# Patient Record
Sex: Male | Born: 1960 | Race: White | Hispanic: No | Smoking: Never smoker
Health system: Southern US, Community
[De-identification: ages and names within clinical notes are randomized; demographics above are authoritative.]

## PROBLEM LIST (undated history)

## (undated) DIAGNOSIS — R319 Hematuria, unspecified: Secondary | ICD-10-CM

## (undated) DIAGNOSIS — Z87898 Personal history of other specified conditions: Secondary | ICD-10-CM

## (undated) DIAGNOSIS — T8859XA Other complications of anesthesia, initial encounter: Secondary | ICD-10-CM

## (undated) DIAGNOSIS — Z87442 Personal history of urinary calculi: Secondary | ICD-10-CM

## (undated) DIAGNOSIS — T4145XA Adverse effect of unspecified anesthetic, initial encounter: Secondary | ICD-10-CM

## (undated) DIAGNOSIS — I10 Essential (primary) hypertension: Secondary | ICD-10-CM

## (undated) DIAGNOSIS — N200 Calculus of kidney: Secondary | ICD-10-CM

## (undated) DIAGNOSIS — Z8249 Family history of ischemic heart disease and other diseases of the circulatory system: Secondary | ICD-10-CM

## (undated) DIAGNOSIS — Z9109 Other allergy status, other than to drugs and biological substances: Secondary | ICD-10-CM

## (undated) DIAGNOSIS — E119 Type 2 diabetes mellitus without complications: Secondary | ICD-10-CM

## (undated) DIAGNOSIS — E039 Hypothyroidism, unspecified: Secondary | ICD-10-CM

## (undated) DIAGNOSIS — K219 Gastro-esophageal reflux disease without esophagitis: Secondary | ICD-10-CM

## (undated) DIAGNOSIS — E079 Disorder of thyroid, unspecified: Secondary | ICD-10-CM

## (undated) DIAGNOSIS — H9190 Unspecified hearing loss, unspecified ear: Secondary | ICD-10-CM

## (undated) DIAGNOSIS — J309 Allergic rhinitis, unspecified: Secondary | ICD-10-CM

## (undated) HISTORY — PX: HX OTHER: 2100001105

## (undated) HISTORY — DX: Allergic rhinitis, unspecified: J30.9

## (undated) HISTORY — DX: Hypothyroidism, unspecified: E03.9

## (undated) HISTORY — PX: HX COLONOSCOPY: 2100001147

## (undated) HISTORY — DX: Type 2 diabetes mellitus without complications: E11.9

## (undated) HISTORY — PX: HX UPPER ENDOSCOPY: 2100001144

## (undated) HISTORY — DX: Gastro-esophageal reflux disease without esophagitis: K21.9

## (undated) HISTORY — DX: Unspecified hearing loss, unspecified ear: H91.90

## (undated) HISTORY — PX: HX TENDON REPAIR: 2100001167

## (undated) HISTORY — PX: ESOPHAGOGASTRODUODENOSCOPY: SHX1529

## (undated) HISTORY — PX: COLONOSCOPY: SHX174

## (undated) HISTORY — PX: CYSTOSCOPY/RETROGRADE/URETEROSCOPY/STONE EXTRACTION WITH BASKET: SHX5317

---

## 2002-09-18 ENCOUNTER — Inpatient Hospital Stay (HOSPITAL_COMMUNITY): Admission: EM | Admit: 2002-09-18 | Discharge: 2002-09-19 | Payer: Self-pay | Admitting: Emergency Medicine

## 2003-01-05 ENCOUNTER — Ambulatory Visit (HOSPITAL_COMMUNITY): Admission: RE | Admit: 2003-01-05 | Discharge: 2003-01-05 | Payer: Self-pay | Admitting: Urology

## 2003-01-05 ENCOUNTER — Encounter: Payer: Self-pay | Admitting: Urology

## 2003-04-06 ENCOUNTER — Ambulatory Visit (HOSPITAL_BASED_OUTPATIENT_CLINIC_OR_DEPARTMENT_OTHER): Admission: RE | Admit: 2003-04-06 | Discharge: 2003-04-06 | Payer: Self-pay | Admitting: Urology

## 2006-04-06 ENCOUNTER — Encounter: Admission: RE | Admit: 2006-04-06 | Discharge: 2006-04-06 | Payer: Self-pay | Admitting: General Surgery

## 2007-08-28 ENCOUNTER — Emergency Department: Payer: Self-pay | Admitting: Emergency Medicine

## 2007-10-08 ENCOUNTER — Ambulatory Visit (HOSPITAL_BASED_OUTPATIENT_CLINIC_OR_DEPARTMENT_OTHER): Admission: RE | Admit: 2007-10-08 | Discharge: 2007-10-08 | Payer: Self-pay | Admitting: Urology

## 2008-07-05 ENCOUNTER — Ambulatory Visit (INDEPENDENT_AMBULATORY_CARE_PROVIDER_SITE_OTHER): Payer: No Typology Code available for payment source | Admitting: Internal Medicine

## 2008-07-16 ENCOUNTER — Other Ambulatory Visit (INDEPENDENT_AMBULATORY_CARE_PROVIDER_SITE_OTHER): Payer: No Typology Code available for payment source | Admitting: Internal Medicine

## 2008-07-16 ENCOUNTER — Other Ambulatory Visit (INDEPENDENT_AMBULATORY_CARE_PROVIDER_SITE_OTHER): Payer: Self-pay | Admitting: Internal Medicine

## 2008-07-24 NOTE — Progress Notes (Signed)
[facility] Department of Cardiology  [address]  [place], [place] [address]    [facility]  [address]  [place], [place] [address]  [phone]    PROGRESS NOTE    PATIENT NAME: Ricardo Andrade, Ricardo Andrade  CHART NUMBER: [id number]  DATE OF BIRTH: 1961/02/02  DATE OF SERVICE: 07/05/2008    REFERRING PHYSICIAN:  Alecia Lemming, MD at [facility].    HISTORY OF PRESENT ILLNESS:  Ricardo Andrade is a 47 year old male with a known history of longstanding hypertension.  The patient states he was diagnosed with this approximately 15 years ago and has been treated for approximately 9 years for this.  Initially on metoprolol, the patient was then converted to Micardis and then later on benazepril, which he is now taking.  He denies any other medical problems.  At a routine health fair, the patient was noted to have sinus rhythm and EKG changes suggestive of LVH.  Echocardiogram was then performed by his primary care physician, which was performed on 06/08/2008.  This was a technically difficult study but revealed a mildly reduced ejection fraction of 45-55 with some mild septal and inferoposterior wall hypokinesis.  LVH was noted.  Right ventricular enlargement was appreciated, otherwise normal chamber size was noted.  There was no significant valvular disease.  On the basis of this, the patient was referred for cardiac evaluation today.     REVIEW OF SYMPTOMS:  The patient denies any history of any fevers, does have occasional rashes but denies any double vision.  Has some very mild difficulty in hearing.  On specific questioning, the patient does describe episodes of chest pain.  He states these are central, almost like an ache in the muscle, usually after he is working.  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  He states the pain is dull in nature, almost like a muscle pull and does not occur at rest.  He denies any shortness of breathing, stomach ulcers or kidney stones.  No history of any joint pains, arm or leg numbness.  No history of any glandular enlargement, nocturia or allergies to animals.    PAST MEDICAL HISTORY:  Hypertension.    PAST SURGICAL HISTORY:  Ruptured scrotal blood vessel.    ALLERGIES:  None.    CURRENT MEDICATIONS:  1.  Benazepril 80 mg daily.  2.  Folic acid 800 daily.  3.  Fish oil.  4.  Vitamin B6.    SOCIAL HISTORY:  Denies tobacco abuse but does chew tobacco.  Consumes alcohol on a regular basis, almost 3-4 beers daily.    FAMILY HISTORY:  Significant for his brother having hypercholesterolemia and stent placement at the age of 33.  There is also a family history of his mother having an MI at the age of 26 and his father having an aneurysm.    PHYSICAL EXAMINATION:  This is a pleasant male in no acute distress.  Blood pressure is 128/70, pulse of 84, respirations 18, temperature 98.1, height 6 feet, weight 232 and BMI of 31.  HEENT examination is normal.  Pupils equal, react to light and accommodation.  Throat is clear.  No jugular venous distention.  No thyroid enlargement.  No carotid bruits.  Heart is regular rate and rhythm, normal S1, S2 without any murmurs or gallops.  Abdomen is soft, nontender with good bowel sounds.  Lower extremities reveal no edema.  Neurologically, there appears to be no focal deficits.  Skin is warm  to touch without any erythema or rash.     ASSESSMENT:  A 47 year old male with hypertension, apparently well controlled on ACE inhibitors with LVH, mildly reduced left ventricular systolic function, possibly a normal variant,possibly secondary to ischemia or alcohol, with some wall motion abnormalities as described.    REVIEW OF DATA:  1.  EKG performed at the health fair shows normal sinus rhythm and LVH.  2.  Lipid profile performed at the health fair shows total cholesterol 139, HDL of 45, LDL of 79.  3.  TSH is normal.  4.  Routine labs including CBC and electrolytes are normal.  5.  Echocardiogram as above, shows mildly reduced left ventricular systolic function with LVH, right ventricular enlargement and no significant valvular disease.    PLAN:  1.  We will proceed with exercise stress testing.  The patient does have risk factors in the form of hypertension as well as family history.  2.  Lipid profile is adequate.  3.  Hypertension, currently well controlled.  4.  The patient has been advised to curtail his alcohol consumption.    The patient will be seen after obtaining stress test.      Mady Gemma, MD  Assistant Professor, Section of Cardiology  [facility] Department of Medicine    [id number]; D: 07/05/2008 17:01:46; T: 07/05/2008 23:48:40    cc: Alecia Lemming MD      [address]       [place], [place] [address]

## 2008-07-29 ENCOUNTER — Encounter (INDEPENDENT_AMBULATORY_CARE_PROVIDER_SITE_OTHER): Payer: No Typology Code available for payment source | Admitting: Internal Medicine

## 2008-08-16 NOTE — Progress Notes (Signed)
 [facility] Department of Cardiology  [address]  [place], [place] [address]    [facility] [facility]  [address]  [place], [place] [address]  Ph: [phone]  Fax: [phone]    PROGRESS NOTE    PATIENT NAME: Ricardo Andrade, Ricardo Andrade  CHART NUMBER: [id number]  DATE OF BIRTH: 1961/04/18  DATE OF SERVICE: 07/29/2008    REFERRING PHYSICIAN:  Ozell JONELLE Dieter, MD    HISTORY OF PRESENT ILLNESS:  Zohair is a 47 year old male who has a known longstanding history of hypertension.  He was last seen on 07/05/2008.    At a routine health fair, the patient was noted to have normal sinus rhythm and EKG changes suggestive of LVH.  Echocardiogram performed on 06/08/2008 was a technically difficult study but showed a low-normal ejection fraction of 45-55 with some mild septal and inferior wall hypokinesis.  LVH was noted with some mild right ventricular enlargement.  Following this, the patient had a lipid profile performed, which showed total cholesterol 139, HDL of 45, LDL of 79.  Due to the fact that the patient has hypertension and a family history of coronary artery disease, he was ordered an exercise stress testing.  This was performed on 07/16/2008 and revealed normal function, no wall motion abnormalities and negative for ischemia.    The patient states that he is doing well.  He denies any chest pain.  He exercises regularly and plays football with his children without any difficulty.  He denies any progression in his symptoms.    PHYSICAL EXAMINATION:  Today, the patient is alert, oriented in no acute distress.  Blood pressure is 116/80, pulse of 74.  Height 6 feet, weight 234.  HEENT examination is normal.  Pupils equal reactive to light and accommodation.  Throat is clear.  No jugular venous distention.  No thyroid  enlargement.  No carotid bruits.  Heart is regular rate and rhythm, normal S1, S2 without any murmurs or gallops.  Abdomen is soft, nontender, with good bowel sounds.  Lower extremities  reveal no edema.    ASSESSMENT:  A 47 year old male with hypertension and LVH.  Blood pressure is well controlled on benazepril 80 mg daily.  As mentioned, his EKG suggestive of LVH and mild wall motion abnormalities noted on echo were not appreciated on stress testing.  Consequently, the patient has been advised the following.    PLAN:  1.  Continue blood pressure control with ACE inhibitors as doing.  2.  Continue to undergo an exercise program and exercise 2-3 times a week to achieve target heart rates as performed in his stress test.  3.  No need for antilipid medication.  Target LDL is adequate as is HDL.  Continue to watch diet.    The patient will be discharged from the cardiology clinic and he will follow up with his primary care physician, Dr. Dieter.      Harden MARLA Hageman, MD  Assistant Professor, Section of Cardiology  [facility] Department of Medicine    [id number]; D: 07/29/2008 18:39:10; T: 07/29/2008 23:48:53    cc: Ozell JONELLE Dieter MD      [address]       [place], [place] [address]

## 2010-08-21 ENCOUNTER — Emergency Department (HOSPITAL_BASED_OUTPATIENT_CLINIC_OR_DEPARTMENT_OTHER): Admission: EM | Admit: 2010-08-21 | Discharge: 2010-08-22 | Payer: Self-pay | Admitting: Emergency Medicine

## 2011-04-03 NOTE — Op Note (Signed)
NAMEAKSHAJ, BESANCON                ACCOUNT NO.:  [id number]   MEDICAL RECORD NO.:  [id number]          PATIENT TYPE:  AMB   LOCATION:  [facility]                         FACILITY:  [facility]   PHYSICIAN:  Maretta Bees. Vonita Moss, M.D.DATE OF BIRTH:  06/19/1961   DATE OF PROCEDURE:  10/08/2007  DATE OF DISCHARGE:                               OPERATIVE REPORT   PREOPERATIVE DIAGNOSIS:  Distal right ureteral stone.   POSTOPERATIVE DIAGNOSIS:  Distal right ureteral stone.   PROCEDURE:  Cystoscopy, right ureteroscopy and stone basketing.   SURGEON:  Dr. Larey Dresser.   ANESTHESIA:  General.   INDICATIONS:  This gentleman with a past history of stone disease has  had intermittent right flank pain and he has tried Flomax and pain meds  and stone has still not passed and he requested retrieval of the stone.  He was counseled about ureteroscopy and risk of infection, bleeding or  ureteral injury.   PROCEDURE:  The patient was brought to the operating room and placed in  lithotomy position.  External genitalia were prepped and draped in the  usual fashion.  He was cystoscoped and the bladder was unremarkable.  Fluoroscopy revealed a stone and as noted before in the vicinity of the  distal right ureter.  A retractable core guidewire was placed up the  right ureteral orifice and bypassed the stone without difficulty.  The  ureteral orifice seemed somewhat snug so I inserted the inner core of a  ureteral access sheath to dilate the distal ureter below the stone.  After two minutes of dilation, I inserted the 6-French rigid  ureteroscope and I identified the stone and used the nitinol stone  basket to retrieve the stone without difficulty.  The stone was given to  the patient's wife.  There was no evidence of ureteral injury or trauma  that necessitated a double-J so one was not put in.  The bladder was  emptied, the scope removed and the patient sent to recovery room in good  condition having tolerated  the procedure well.      Maretta Bees. Vonita Moss, M.D.  Electronically Signed     LJP/MEDQ  D:  10/08/2007  T:  10/08/2007  Job:  [id number]

## 2011-04-06 NOTE — Discharge Summary (Signed)
NAME:  Alan Shepherd, Alan Shepherd                          ACCOUNT NO.:  [id number]   MEDICAL RECORD NO.:  [id number]                   PATIENT TYPE:  INP   LOCATION:  4739                                 FACILITY:  [facility]   PHYSICIAN:  Jackie Plum, M.D.             DATE OF BIRTH:  1961-06-24   DATE OF ADMISSION:  09/18/2002  DATE OF DISCHARGE:  09/19/2002                                 DISCHARGE SUMMARY   ADMISSION DIAGNOSES:  1. Chest pain, resolved.     a. The patient was ruled out for myocardial infarction by serial cardiac        enzymes and 12-lead electrocardiogram.  Stress test to be scheduled on        discharge.  2. Hypercholesterolemia.     a. By history the cholesterol level was 250 one year ago.  3. Allergy to aspirin.   DISCHARGE MEDICATIONS:  Metoprolol 25 mg p.o. b.i.d.   DISCHARGE LABORATORY DATA:  The WBC count was 9.0, hemoglobin 12.0,  hematocrit 27.6, MCV 3.4, and platelet count 206.  INR 0.9, PTT 36.  Sodium  137, potassium 3.9, chloride 105, CO2 25, glucose 180, BUN 15, creatinine  1.2, calcium 9.6.   REASON FOR ADMISSION:  Chest pain.   HISTORY OF PRESENT ILLNESS:  The patient is a 50 year old gentleman of  [place] descent, who was admitted by Chales Salmon. Abigail Miyamoto, M.D., yesterday  because of pressure-like left parasternal chest pain which was noted with  right forearm and hand numbness associated with diaphoresis and nausea.  There was no history of shortness of breath.  The chest pain was not  typically radiating.  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  The patient's father died of a heart attack at the age  of 48, but had his first heart attack at the age of 74.  The admitting  physical examination was unremarkable.  The EKG was normal.  So he was  therefore admitted for further evaluation.   HOSPITAL COURSE:  CHEST PAIN:  The patient was admitted  to a telemetry bed.  There were no significant dysrhythmias during his admission to the hospital.  The chest pain resolved spontaneously during the hospitalization and did not  return.  At the time of discharge, he was chest pain free.  Serial cardiac  enzymes and 12-lead EKG were unremarkable for any acute myocardial  infarction or evidence of ischemia.   On account of the patient's significant risk factors of coronary artery  disease, i.e. male sex, hypercholesterolemia, and early family history of  heart disease, we decided to discharge the patient home today since it is a  weekend to be scheduled for an outpatient stress test on Monday.   I had an extensive discussion with the patient and his sister, who was at  his bedside, regarding the plan of care and admonished the patient to return  to the ED or call his primary care physician, Chales Salmon. Abigail Miyamoto, M.D.,  should he experience any problems breathing, chest pain, shortness of  breath, palpitations, or dizziness.  He expressed understanding.  The  patient was discharged home on low-dose metoprolol without aspirin.   CONSULTANTS:  Not applicable.    PROCEDURES:  Not applicable.   CONDITION ON DISCHARGE:  Improved and stable.                                               Jackie Plum, M.D.    GO/MEDQ  D:  09/19/2002  T:  09/19/2002  Job:  [id number]   cc:   Chales Salmon. Abigail Miyamoto, M.D.

## 2011-04-06 NOTE — H&P (Signed)
NAME:  MANG, HAZELRIGG                          ACCOUNT NO.:  [id number]   MEDICAL RECORD NO.:  [id number]                   PATIENT TYPE:  EMS   LOCATION:  [facility]                                 FACILITY:  [facility]   PHYSICIAN:  Chales Salmon. Abigail Miyamoto, M.D.             DATE OF BIRTH:  May 29, 1961   DATE OF ADMISSION:  09/18/2002  DATE OF DISCHARGE:                                HISTORY & PHYSICAL   HISTORY OF PRESENT ILLNESS:  This 50 year old male was well until earlier  this evening when he developed a pressure-like left parasternal chest  discomfort.  He also noted that his right forearm and hand were numb.  He  was diaphoretic and nauseated.  He denies any radiation of the discomfort  and he had no shortness of breath.  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  At other times when he is  physically active he has no pain.   PAST MEDICAL HISTORY:  Hospitalizations:  None.   MEDICATIONS:  None.   ALLERGIES:  None.   FAMILY HISTORY:  His father died at age 24 of a myocardial infarction.  He  had his first two MIs at age 31.  His mother is living at age 57 and has  hypertension.  He has three sisters who are well.   SOCIAL HISTORY:  He is married and has two children who are well.  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  He does not smoke.  He drinks occasionally.   REVIEW OF SYMPTOMS:  Unremarkable, except as mentioned above.  He has no  history of gastrointestinal reflux.  He does have a family history of an  [place] heritage and has a history of anemia in the family due to  thalassemia minor.   PHYSICAL EXAMINATION:  GENERAL APPEARANCE:  The patient is alert and  oriented x 3.  Warm and dry.  He appears to be in no distress at this point  and denies any pain.  VITAL SIGNS:  Blood pressure 130/90, pulse 86 and regular.  HEENT:  PERRLA.  EOMs intact.  Fundi benign.   TMs and pharynx clear.  NECK:  No adenopathy or thyromegaly.  No JVD or HJR.  There are no carotid  bruits.  LUNGS:  Clear to P&A.  HEART:  Regular rhythm with no ectopy or murmurs.  There is no chest wall  tenderness.  ABDOMEN:  Soft and nontender.  EXTREMITIES:  No edema.  NEUROLOGIC:  Cranial nerves are intact.  Motor and cerebellar function are  grossly intact.  SKIN:  Warm and dry.   LABORATORY DATA:  The EKG is normal.  The comprehensive metabolic profile is  normal.  The CPK is 159, CK-MB 1.6 with a relative index of 1.0, and  troponin 0.02.  The CBC reveals a hemoglobin of 12.0, hematocrit 37.6, and  white count 9000.    DIAGNOSIS:  Atypical chest pain, rule out myocardial infarction.   PLAN:  The patient will be admitted to a monitored unit.  Serial enzymes and  EKGs will be obtained.                                               Chales Salmon. Abigail Miyamoto, M.D.    RKT/MEDQ  D:  09/18/2002  T:  09/19/2002  Job:  [id number]

## 2011-04-06 NOTE — Op Note (Signed)
   NAME:  Alan Shepherd, Alan Shepherd                          ACCOUNT NO.:  [id number]   MEDICAL RECORD NO.:  [id number]                   PATIENT TYPE:  AMB   LOCATION:  [facility]                                 FACILITY:  [facility]   PHYSICIAN:  Maretta Bees. Vonita Moss, M.D.             DATE OF BIRTH:  [birth year]   DATE OF PROCEDURE:  04/06/2003  DATE OF DISCHARGE:                                 OPERATIVE REPORT   PREOPERATIVE DIAGNOSIS:  Distal left ureteral calculus.   POSTOPERATIVE DIAGNOSIS:  Distal left ureteral calculus.   PROCEDURE:  Cystoscopy, left ureteroscopy and stone basketing.   SURGEON:  Maretta Bees. Vonita Moss, M.D.   ANESTHESIA:  General.   INDICATIONS FOR PROCEDURE:  This 50 year old gentleman has had some  intermittent left flank pains for several weeks due to a left ureteral  calculus that has now moved down in the distal ureter. He has a lot of other  somatic complaints that are not typical of stones and since he has been  several weeks without stone passage, he elected operative intervention as a  choice at this time.   DESCRIPTION OF PROCEDURE:  The patient was brought to the operating room,  placed in lithotomy position, external genitalia were prepped and draped in  the usual fashion. He was cystoscoped in the anterior urethra and prostate  and bladder were unremarkable. He had fairly small ureteral orifices. A  guidewire was placed up the left ureter and I saw it go by the thin distal  ureteral stone. Balloon dilation of the intramural ureter was then performed  just up to the level of the stone. I then inserted the 6 French short  ureteroscope and encountered this dark brown linear stone. I used the  nitinol stone basket and removed it atraumatically and without difficulty.  The stone was given to the patient's wife. I reinserted the ureteroscope and  there was no significant injury or problem and the procedure went smoothly  and I felt I would leave the double J catheter out which  I did. I emptied  the bladder before removing the cystoscope which was reinserted to retrieve  the stone from the bladder as it had fallen out of the basket as I pulled it  out of the ureter. There was no significant bleeding. A B&O suppository was  inserted per rectum and Xylocaine jelly was injected per urethra and he was  taken to the recovery room in good condition.                                               Maretta Bees. Vonita Moss, M.D.    LJP/MEDQ  D:  04/06/2003  T:  04/06/2003  Job:  [id number]

## 2011-06-27 ENCOUNTER — Encounter (FREE_STANDING_LABORATORY_FACILITY)
Admit: 2011-06-27 | Discharge: 2011-06-27 | Disposition: A | Discharge status: Home / Self Care | Payer: Self-pay | Attending: Surgery | Admitting: Surgery

## 2011-06-27 LAB — DEMOGRAPHICS: PATIENT NUMBER: 10273030

## 2011-07-03 LAB — LYME DISEASE ANTIBODY, IMMUNOBLOT, SERUM
B.BURGDORFERI IgG WB: NEGATIVE
B.BURGDORFERI IgM WB: NEGATIVE

## 2011-07-13 ENCOUNTER — Encounter (INDEPENDENT_AMBULATORY_CARE_PROVIDER_SITE_OTHER): Payer: Self-pay | Admitting: Otolaryngology-BA

## 2011-07-13 ENCOUNTER — Ambulatory Visit: Payer: No Typology Code available for payment source | Attending: Otolaryngology-BA | Admitting: Otolaryngology-BA

## 2011-07-13 VITALS — BP 130/88 | HR 90 | Temp 98.2°F | Ht 72.0 in | Wt 231.0 lb

## 2011-07-13 DIAGNOSIS — B358 Other dermatophytoses: Secondary | ICD-10-CM | POA: Insufficient documentation

## 2011-07-13 MED ORDER — CLOTRIMAZOLE 1 % TOPICAL CREAM
TOPICAL_CREAM | Freq: Two times a day (BID) | CUTANEOUS | Status: DC
Start: 2011-07-13 — End: 2020-10-05

## 2011-07-13 NOTE — H&P (Addendum)
PATIENT NAME:  Ricardo Andrade  MRN:  [id number]  DOB:  07-22-1961  DATE OF SERVICE: 07/13/2011    Chief Complaint:  Rash and Otitis Externa      HPI:  Ricardo Andrade is a 50 y.o. male who presents with complaints of facial and neck rash.  He states he saw his pcp back in May of this year after he had a red circular rash on his right face in front of his ear.  He states it looked like a sting of some type but never remembers being stung.  He states he was sent to a surgeon close to his home and was treated with doxycycline for what was felt to be Lymes disease but he states he had blood testing that was negative.  He states he has since stopped the antibiotic.  He states he was told the surgeon felt there might be some swelling behind his mandible.  He denies any recent illness, fever, chills, night sweats. He denies any rash like this previously.  He has no other complaints today.    Past Medical History:  Past Medical History   Diagnosis Date   . Allergic rhinitis    . HTN    . Hearing loss        Past Surgical History:  History reviewed. No pertinent past surgical history.    Family History:  Family History   Problem Relation Age of Onset   . Diabetes Mother    . Hypertension Father    . Heart Disease Brother    . Diabetes Brother        Social History:  History   Smoking status   . Never Smoker    Smokeless tobacco   . Not on file     History   Alcohol Use   . Yes     Social History     Occupational History   . Not on file.       Medications:  Outpatient Prescriptions Marked as Taking for the 07/13/11 encounter (Office Visit) with Zeb Comfort, MD   Medication Sig   . benazepril (LOTENSIN) 5 mg Oral Tablet take 40 mg by mouth Once a day.     Marland Kitchen doxycycline hyclate (VIBRAMYCIN) 100 mg Oral Capsule take 100 mg by mouth Twice daily.     . Omega-3 Fatty Acids-Vitamin E (FISH OIL) 1,000 mg Oral Capsule take 1,000 mg by mouth Twice daily.         Allergies:  No Known Allergies    Review of Systems:   Do you have any fevers: no   Any weight change: yes Explain Weight Change: weight loss Change in your vision: no    Chest Pain: no   Shortness of Breath: no   Stomach pain: no   Urinary difficulity: no   Joint Pain: no   Skin Problems: no   Weakness or Numbness: no   Easy Bruising or Bleeding: no   Excessive Thirst: no   Seasonal Allergies: yes Explain Seasonal Allergies: otc medicine  All other systems reviewed and found to be negative.    Physical Exam:  Blood pressure 130/88, pulse 90, temperature 36.8 C (98.2 F), height 1.829 m (6'), weight 104.781 kg (231 lb).  Body mass index is 31.33 kg/(m^2).  General Appearance: Pleasant, cooperative, healthy, and in no acute distress.  Eyes: Conjunctivae/corneas clear, PERRLA, EOM's intact.  Head and Face: Normocephalic, atraumatic.  Face symmetric, there appears to be a fungal infection of the  skin a circular pattern, it is erythemic   Pinnae: Normal shape and position.   External auditory canals:  Patent without inflammation.  Tympanic membranes:  Intact, translucent, midposition, middle ear aerated.  Nose:  External pyramid midline. Septum midline. Mucosa normal. No purulence, polyps, or crusts.   Oral Cavity/Oropharynx: No mucosal lesions, masses, or pharyngeal asymmetry.  Hypopharynx/Larynx: Indirect mirror laryngoscopy revealed no hypopharyngeal or laryngeal masses or lesions, normal laryngeal mobility, and voice normal.  Neck:  At the right angle of mandible there is either a very small lymph node vs parotid tissue, No palpable thyroid, salivary gland, or neck masses.  Heme/Lymph:  No cervical adenopathy.  Cardiovascular:  Good perfusion of upper extremities.  No cyanosis of the hands or fingers.  Lungs: No apparent stridorous breathing. No acute distress.  Skin: Skin warm and dry.  Neurologic: Cranial nerves:  grossly intact.  Psychiatric:  Alert and oriented x 3.      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    Plan:  Use clotrimazole to right face and neck and if there is no resolution of symptoms we will refer to dermatology for evaluation.  We did find any palpable neck mass.  He will follow up prn.    Tawni Carnes PA-C  Physician Assistant   Dept.of Otolaryngology    Pt seen in clinic with Dr. Jimmye Norman.    I have seen and examined this patient with the Physician Assistant and concur with assessment and plan.    Zeb Comfort, MD

## 2011-07-16 ENCOUNTER — Ambulatory Visit (INDEPENDENT_AMBULATORY_CARE_PROVIDER_SITE_OTHER): Payer: Self-pay | Admitting: Physician Assistant

## 2011-07-16 NOTE — Telephone Encounter (Signed)
 rx called into patients pharmacy.    Alm JAYSON Gobble, PA-C

## 2011-07-16 NOTE — Telephone Encounter (Signed)
Message copied by Elba Barman on Mon Jul 16, 2011  1:05 PM  ------       Message from: Omar Person       Created: Mon Jul 16, 2011 12:35 PM         >> Judeth Cornfield TIDD 07/16/2011 12:35 PM       DR Jimmye Norman PT           Patient called stating he called his pharmacy and they do not have his clotrimazole (LOTRIMIN) 1 % Cream on file. He asked if that could be called in again. Blessing can be reached at [phone]. Thanks                     Preferred Pharmacy          [id number] [place]ers, [place] - [address] AT [address]         [address] [place] [address]         Phone: [phone] Fax: [phone]

## 2011-08-28 LAB — POCT HEMOGLOBIN-HEMACUE
Hemoglobin: 11.9 — ABNORMAL LOW
Operator id: 268271

## 2012-01-10 ENCOUNTER — Other Ambulatory Visit: Payer: Self-pay | Admitting: Family Medicine

## 2012-01-10 DIAGNOSIS — R1900 Intra-abdominal and pelvic swelling, mass and lump, unspecified site: Secondary | ICD-10-CM

## 2012-01-10 DIAGNOSIS — Z139 Encounter for screening, unspecified: Secondary | ICD-10-CM

## 2012-01-15 ENCOUNTER — Ambulatory Visit
Admission: RE | Admit: 2012-01-15 | Discharge: 2012-01-15 | Disposition: A | Discharge status: Home / Self Care | Payer: BC Managed Care – PPO | Source: Ambulatory Visit | Attending: Family Medicine | Admitting: Family Medicine

## 2012-01-15 DIAGNOSIS — R1900 Intra-abdominal and pelvic swelling, mass and lump, unspecified site: Secondary | ICD-10-CM

## 2012-01-15 DIAGNOSIS — Z139 Encounter for screening, unspecified: Secondary | ICD-10-CM

## 2012-01-15 MED ORDER — GADOBENATE DIMEGLUMINE 529 MG/ML IV SOLN
19.0000 mL | Freq: Once | INTRAVENOUS | Status: AC | PRN
  Administered 2012-01-15: 19 mL via INTRAVENOUS

## 2012-04-14 ENCOUNTER — Emergency Department (HOSPITAL_BASED_OUTPATIENT_CLINIC_OR_DEPARTMENT_OTHER)
Admission: EM | Admit: 2012-04-14 | Discharge: 2012-04-14 | Disposition: A | Discharge status: Home / Self Care | Payer: BC Managed Care – PPO | Attending: Emergency Medicine | Admitting: Emergency Medicine

## 2012-04-14 ENCOUNTER — Encounter (HOSPITAL_BASED_OUTPATIENT_CLINIC_OR_DEPARTMENT_OTHER): Payer: Self-pay | Admitting: *Deleted

## 2012-04-14 DIAGNOSIS — Z886 Allergy status to analgesic agent status: Secondary | ICD-10-CM | POA: Insufficient documentation

## 2012-04-14 DIAGNOSIS — Z87442 Personal history of urinary calculi: Secondary | ICD-10-CM | POA: Insufficient documentation

## 2012-04-14 DIAGNOSIS — M26629 Arthralgia of temporomandibular joint, unspecified side: Secondary | ICD-10-CM | POA: Insufficient documentation

## 2012-04-14 DIAGNOSIS — J019 Acute sinusitis, unspecified: Secondary | ICD-10-CM | POA: Insufficient documentation

## 2012-04-14 MED ORDER — AZITHROMYCIN 250 MG PO TABS: 250.0000 mg | ORAL_TABLET | Freq: Every day | ORAL | Status: DC

## 2012-04-14 MED ORDER — AZITHROMYCIN 250 MG PO TABS: 250.0000 mg | ORAL_TABLET | Freq: Every day | ORAL | Status: AC

## 2012-04-14 NOTE — Discharge Instructions (Signed)
Sinusitis Sinuses are air pockets within the bones of your face. The growth of bacteria within a sinus leads to infection. The infection prevents the sinuses from draining. This infection is called sinusitis. SYMPTOMS  There will be different areas of pain depending on which sinuses have become infected.  The maxillary sinuses often produce pain beneath the eyes.   Frontal sinusitis may cause pain in the middle of the forehead and above the eyes.  Other problems (symptoms) include:  Toothaches.   Colored, pus-like (purulent) drainage from the nose.   Swelling, warmth, and tenderness over the sinus areas may be signs of infection.  TREATMENT  Sinusitis is most often determined by an exam.X-rays may be taken. If x-rays have been taken, make sure you obtain your results or find out how you are to obtain them. Your caregiver may give you medications (antibiotics). These are medications that will help kill the bacteria causing the infection. You may also be given a medication (decongestant) that helps to reduce sinus swelling.  HOME CARE INSTRUCTIONS   Only take over-the-counter or prescription medicines for pain, discomfort, or fever as directed by your caregiver.   Drink extra fluids. Fluids help thin the mucus so your sinuses can drain more easily.   Applying either moist heat or ice packs to the sinus areas may help relieve discomfort.   Use saline nasal sprays to help moisten your sinuses. The sprays can be found at your local drugstore.  SEEK IMMEDIATE MEDICAL CARE IF:  You have a fever.   You have increasing pain, severe headaches, or toothache.   You have nausea, vomiting, or drowsiness.   You develop unusual swelling around the face or trouble seeing.  MAKE SURE YOU:   Understand these instructions.   Will watch your condition.   Will get help right away if you are not doing well or get worse.  Document Released: 11/05/2005 Document Revised: 10/25/2011 Document Reviewed:  06/04/2007 [facility] Patient Information 2012 [place], [place].Jaw Range of Motion Exercises Do the exercises below to prevent problems opening your mouth after a jaw fracture, TMJ, or surgery. HOME CARE Warm up  Open your mouth as wide as possible for 15 seconds.   Then, close your mouth and rest. Repeat this 8 times.  Exercises  Stick your jaw forward for 15 seconds. Rest your jaw. Repeat this 8 times.   Move your jaw right for 15 seconds. Rest your jaw and repeat 8 times.   Move your jaw left for 15 seconds. Rest your jaw and repeat 8 times.   Put your middle finger and thumb in your mouth. Push with your thumb on your top teeth and your middle finger on your bottom teeth opening your mouth.   Stretch your mouth open as far as possible. Repeat 8 times.   Chew gum when you are not doing exercises.   Use moist heat packs 3 to 4 times a day on your jaw area.  Document Released: 10/18/2008 Document Revised: 10/25/2011 Document Reviewed: 10/18/2008 [facility] Patient Information 2012 [place], [place].

## 2012-04-14 NOTE — ED Notes (Signed)
C/o jaw pain and sinus pain x 2 days

## 2012-04-14 NOTE — ED Provider Notes (Signed)
History  This chart was scribed for Nelia Shi, MD by Cherlynn Perches. The patient was seen in room MH02/MH02. Patient's care was started at 2009.  CSN: [id number]  Arrival date & time 04/14/12  2009   First MD Initiated Contact with Patient 04/14/12 2028      Chief Complaint  Patient presents with  . Jaw Pain  . Facial Pain    (Consider location/radiation/quality/duration/timing/severity/associated sxs/prior treatment) HPI  Alan Shepherd is a 51 y.o. male who presents to the Emergency Department complaining of 2 days of gradually worsening, constant jaw pain localized to the right jaw with associated clicking of the jaw and right ear pain. Pt reports that he first noticed pain and clicking Saturday night. Pt denies any injury or history of TMJ. Pt also complains of "a few days of" sinus pain described as pressure with associated rhinorrhea, cough, and chills. Pt states that he has been very busy with work and is not sure exactly when symptoms began. Pt denies fever, nausea, and vomiting. Pt denies smoking and alcohol use.   Past Medical History  Diagnosis Date  . Kidney stones     Past Surgical History  Procedure Date  . Kidney stone surgery     No family history on file.  History  Substance Use Topics  . Smoking status: Never Smoker   . Smokeless tobacco: Never Used  . Alcohol Use: No      Review of Systems  Constitutional: Positive for chills. Negative for fever.  HENT: Positive for ear pain, congestion, rhinorrhea, dental problem and sinus pressure.   Respiratory: Positive for cough.   Gastrointestinal: Negative for nausea, vomiting and diarrhea.  All other systems reviewed and are negative.    Allergies  Aspirin  Home Medications   Current Outpatient Rx  Name Route Sig Dispense Refill  . AZITHROMYCIN 250 MG PO TABS Oral Take 1 tablet (250 mg total) by mouth daily. Take 2 tablets on day one then one tablet a day until gone 6 tablet 0    BP  148/101  Pulse 80  Temp(Src) 98.5 F (36.9 C) (Oral)  Resp 18  SpO2 97%  Physical Exam  Nursing note and vitals reviewed. Constitutional: He is oriented to person, place, and time. He appears well-developed and well-nourished. No distress.  HENT:  Head: Normocephalic and atraumatic.       With opening and closing of his mandible he could feel and hear clicks in his TMJs which reproduced sharp electrical type pains he said.  Oral pharynx is clear and unremarkable no swelling.   Some sinus tenderness noted to percussion.  Eyes: Pupils are equal, round, and reactive to light.  Neck: Normal range of motion.  Cardiovascular: Normal rate and intact distal pulses.   Pulmonary/Chest: No respiratory distress.  Abdominal: Normal appearance. He exhibits no distension.  Musculoskeletal: Normal range of motion.  Neurological: He is alert and oriented to person, place, and time. No cranial nerve deficit.  Skin: Skin is warm and dry. No rash noted.  Psychiatric: He has a normal mood and affect. His behavior is normal.    ED Course  Procedures (including critical care time)  DIAGNOSTIC STUDIES: Oxygen Saturation is 97% on room air, adequate by my interpretation.    COORDINATION OF CARE: 8:34PM - Will give azithromycin for sinus infection. Advised pt to see his dentist or oral surgeon. Pt agrees.    Labs Reviewed - No data to display No results found.   1. Acute sinusitis  2. TMJ syndrome       MDM  I personally performed the services described in this documentation, which was scribed in my presence. The recorded information has been reviewed and considered.    Nelia Shi, MD 04/14/12 2055

## 2012-06-28 ENCOUNTER — Ambulatory Visit (INDEPENDENT_AMBULATORY_CARE_PROVIDER_SITE_OTHER): Payer: BC Managed Care – PPO | Admitting: Emergency Medicine

## 2012-06-28 VITALS — BP 127/80 | HR 54 | Temp 98.4°F | Resp 14 | Ht 69.5 in | Wt 208.0 lb

## 2012-06-28 DIAGNOSIS — K140 Glossitis: Secondary | ICD-10-CM

## 2012-06-28 DIAGNOSIS — R1031 Right lower quadrant pain: Secondary | ICD-10-CM

## 2012-06-28 DIAGNOSIS — N200 Calculus of kidney: Secondary | ICD-10-CM

## 2012-06-28 LAB — POCT UA - MICROSCOPIC ONLY
Casts, Ur, LPF, POC: NEGATIVE
Crystals, Ur, HPF, POC: NEGATIVE
Mucus, UA: POSITIVE
Yeast, UA: NEGATIVE

## 2012-06-28 LAB — POCT CBC
Hemoglobin: 12.7 g/dL — AB (ref 14.1–18.1)
Lymph, poc: 2.7 (ref 0.6–3.4)
MCHC: 29.7 g/dL — AB (ref 31.8–35.4)
MID (cbc): 0.6 (ref 0–0.9)
MPV: 9.7 fL (ref 0–99.8)
POC Granulocyte: 3.8 (ref 2–6.9)
POC LYMPH PERCENT: 38.7 %L (ref 10–50)
POC MID %: 8.4 %M (ref 0–12)
Platelet Count, POC: 246 10*3/uL (ref 142–424)
RDW, POC: 16.8 %

## 2012-06-28 LAB — POCT URINALYSIS DIPSTICK
Bilirubin, UA: NEGATIVE
Glucose, UA: NEGATIVE
Ketones, UA: NEGATIVE
Leukocytes, UA: NEGATIVE
Nitrite, UA: NEGATIVE
Protein, UA: NEGATIVE
Spec Grav, UA: 1.025
Urobilinogen, UA: 0.2
pH, UA: 5.5

## 2012-06-28 MED ORDER — KETOROLAC TROMETHAMINE 60 MG/2ML IM SOLN
60.0000 mg | Freq: Once | INTRAMUSCULAR | Status: AC
  Administered 2012-06-28: 60 mg via INTRAMUSCULAR

## 2012-06-28 MED ORDER — HYDROCODONE-ACETAMINOPHEN 5-500 MG PO TABS: 1.0000 | ORAL_TABLET | ORAL | Status: AC | PRN

## 2012-06-28 NOTE — Patient Instructions (Addendum)
Abdominal Pain  Abdominal pain can be caused by many things. Your caregiver decides the seriousness of your pain by an examination and possibly blood tests and X-rays. Many cases can be observed and treated at home. Most abdominal pain is not caused by a disease and will probably improve without treatment. However, in many cases, more time must pass before a clear cause of the pain can be found. Before that point, it may not be known if you need more testing, or if hospitalization or surgery is needed.  HOME CARE INSTRUCTIONS   · Do not take laxatives unless directed by your caregiver.  · Take pain medicine only as directed by your caregiver.  · Only take over-the-counter or prescription medicines for pain, discomfort, or fever as directed by your caregiver.  · Try a clear liquid diet (broth, tea, or water) for as long as directed by your caregiver. Slowly move to a bland diet as tolerated.  SEEK IMMEDIATE MEDICAL CARE IF:   · The pain does not go away.  · You have a fever.  · You keep throwing up (vomiting).  · The pain is felt only in portions of the abdomen. Pain in the right side could possibly be appendicitis. In an adult, pain in the left lower portion of the abdomen could be colitis or diverticulitis.  · You pass bloody or black tarry stools.  MAKE SURE YOU:   · Understand these instructions.  · Will watch your condition.  · Will get help right away if you are not doing well or get worse.  Document Released: 08/15/2005 Document Revised: 10/25/2011 Document Reviewed: 06/23/2008  ExitCare® Patient Information ©2012 [employer].  Kidney Stones-?  Kidney stones (ureteral lithiasis) are deposits that form inside your kidneys. The intense pain is caused by the stone moving through the urinary tract. When the stone moves, the ureter goes into spasm around the stone. The stone is usually passed in the urine.   CAUSES   · A disorder that makes certain neck glands produce too much parathyroid hormone (primary  hyperparathyroidism).  · A buildup of uric acid crystals.  · Narrowing (stricture) of the ureter.  · A kidney obstruction present at birth (congenital obstruction).  · Previous surgery on the kidney or ureters.  · Numerous kidney infections.  SYMPTOMS   · Feeling sick to your stomach (nauseous).  · Throwing up (vomiting).  · Blood in the urine (hematuria).  · Pain that usually spreads (radiates) to the groin.  · Frequency or urgency of urination.  DIAGNOSIS   · Taking a history and physical exam.  · Blood or urine tests.  · Computerized X-ray scan (CT scan).  · Occasionally, an examination of the inside of the urinary bladder (cystoscopy) is performed.  TREATMENT   · Observation.  · Increasing your fluid intake.  · Surgery may be needed if you have severe pain or persistent obstruction.  The size, location, and chemical composition are all important variables that will determine the proper choice of action for you. Talk to your caregiver to better understand your situation so that you will minimize the risk of injury to yourself and your kidney.   HOME CARE INSTRUCTIONS   · Drink enough water and fluids to keep your urine clear or pale yellow.  · Strain all urine through the provided strainer. Keep all particulate matter and stones for your caregiver to see. The stone causing the pain may be as small as a grain of salt. It is   very important to use the strainer each and every time you pass your urine. The collection of your stone will allow your caregiver to analyze it and verify that a stone has actually passed.  · Only take over-the-counter or prescription medicines for pain, discomfort, or fever as directed by your caregiver.  · Make a follow-up appointment with your caregiver as directed.  · Get follow-up X-rays if required. The absence of pain does not always mean that the stone has passed. It may have only stopped moving. If the urine remains completely obstructed, it can cause loss of kidney function or even  complete destruction of the kidney. It is your responsibility to make sure X-rays and follow-ups are completed. Ultrasounds of the kidney can show blockages and the status of the kidney. Ultrasounds are not associated with any radiation and can be performed easily in a matter of minutes.  SEEK IMMEDIATE MEDICAL CARE IF:   · Pain cannot be controlled with the prescribed medicine.  · You have a fever.  · The severity or intensity of pain increases over 18 hours and is not relieved by pain medicine.  · You develop a new onset of abdominal pain.  · You feel faint or pass out.  MAKE SURE YOU:   · Understand these instructions.  · Will watch your condition.  · Will get help right away if you are not doing well or get worse.  Document Released: 11/05/2005 Document Revised: 10/25/2011 Document Reviewed: 03/03/2010  ExitCare® Patient Information ©2012 [employer].

## 2012-06-28 NOTE — Progress Notes (Signed)
Date:  06/28/2012   Name:  Alan Shepherd   DOB:  Feb 15, 1961   MRN:  [id number] Gender: male  Age: 51 y.o.  PCP:  No primary provider on file.    Chief Complaint: Abdominal Pain   History of Present Illness:  Alan Shepherd is a 51 y.o. pleasant patient who presents with the following:  History of kidney stones on several occassions.  Two day history of right flank pain radiating into right testicle. Colicky nature.  No fever chills, nausea or vomiting, no stool change.  No urinary symptoms.  Hungry.  No fever or chills.   There is no problem list on file for this patient.   Past Medical History  Diagnosis Date  . Kidney stones     Past Surgical History  Procedure Date  . Kidney stone surgery     History  Substance Use Topics  . Smoking status: Never Smoker   . Smokeless tobacco: Never Used  . Alcohol Use: No    No family history on file.  Allergies  Allergen Reactions  . Aspirin Itching    Medication list has been reviewed and updated.  No current outpatient prescriptions on file prior to visit.    Review of Systems:  As per HPI, otherwise negative.    Physical Examination: Filed Vitals:   06/28/12 1750  BP: 127/80  Pulse: 54  Temp: 98.4 F (36.9 C)  Resp: 14   Filed Vitals:   06/28/12 1750  Height: 5' 9.5" (1.765 m)  Weight: 208 lb (94.348 kg)   Body mass index is 30.28 kg/(m^2). Ideal Body Weight: Weight in (lb) to have BMI = 25: 171.4   GEN: WDWN, NAD, Non-toxic, A & O x 3 HEENT: Atraumatic, Normocephalic. Neck supple. No masses, No LAD. Ears and Nose: No external deformity. CV: RRR, No M/G/R. No JVD. No thrill. No extra heart sounds. PULM: CTA B, no wheezes, crackles, rhonchi. No retractions. No resp. distress. No accessory muscle use. ABD: S, ND, +BS. No rebound. No HSM. Tender right flank and right lower quadrant. EXTR: No c/c/e NEURO Normal gait.  PSYCH: Normally interactive. Conversant. Not depressed or anxious appearing.  Calm  demeanor.  Genitalia:  Normal male circumcised   Assessment and Plan: Kidney stone toradol in office vicodin to go Follow up for new or worsened symptoms; fever, increased pain, vomiting.  Carmelina Dane, MD Results for orders placed in visit on 06/28/12  POCT URINALYSIS DIPSTICK      Component Value Range   Color, UA yellow     Clarity, UA clear     Glucose, UA neg     Bilirubin, UA neg     Ketones, UA neg     Spec Grav, UA 1.025     Blood, UA trace     pH, UA 5.5     Protein, UA neg     Urobilinogen, UA 0.2     Nitrite, UA neg     Leukocytes, UA Negative    POCT UA - MICROSCOPIC ONLY      Component Value Range   WBC, Ur, HPF, POC 1-2     RBC, urine, microscopic 6-11     Bacteria, U Microscopic 1+     Mucus, UA pos     Epithelial cells, urine per micros 0-1     Crystals, Ur, HPF, POC neg     Casts, Ur, LPF, POC neg     Yeast, UA neg    POCT CBC  Component Value Range   WBC 7.1  4.6 - 10.2 K/uL   Lymph, poc 2.7  0.6 - 3.4   POC LYMPH PERCENT 38.7  10 - 50 %L   MID (cbc) 0.6  0 - 0.9   POC MID % 8.4  0 - 12 %M   POC Granulocyte 3.8  2 - 6.9   Granulocyte percent 52.9  37 - 80 %G   RBC 6.26 (*) 4.69 - 6.13 M/uL   Hemoglobin 12.7 (*) 14.1 - 18.1 g/dL   HCT, POC 37.9 (*) 02.4 - 53.7 %   MCV 68.3 (*) 80 - 97 fL   MCH, POC 20.3 (*) 27 - 31.2 pg   MCHC 29.7 (*) 31.8 - 35.4 g/dL   RDW, POC 09.7     Platelet Count, POC 246  142 - 424 K/uL   MPV 9.7  0 - 99.8 fL

## 2012-12-18 ENCOUNTER — Encounter (FREE_STANDING_LABORATORY_FACILITY): Admit: 2012-12-18 | Discharge: 2012-12-18 | Disposition: A | Discharge status: Home / Self Care | Payer: Self-pay

## 2012-12-22 LAB — THYROPEROXIDASE (TPO) ANTIBODIES, SERUM: THYROPEROXIDASE (TPO) ANTIBODIES, SERUM: 120 [IU]/mL — ABNORMAL HIGH (ref <51)

## 2013-05-20 ENCOUNTER — Other Ambulatory Visit: Payer: Self-pay | Admitting: Family Medicine

## 2013-05-20 ENCOUNTER — Ambulatory Visit
Admission: RE | Admit: 2013-05-20 | Discharge: 2013-05-20 | Disposition: A | Discharge status: Home / Self Care | Payer: BC Managed Care – PPO | Source: Ambulatory Visit | Attending: Family Medicine | Admitting: Family Medicine

## 2013-05-20 DIAGNOSIS — R1031 Right lower quadrant pain: Secondary | ICD-10-CM

## 2013-05-20 MED ORDER — IOHEXOL 300 MG/ML  SOLN
125.0000 mL | Freq: Once | INTRAMUSCULAR | Status: AC | PRN
  Administered 2013-05-20: 125 mL via INTRAVENOUS

## 2013-05-21 ENCOUNTER — Other Ambulatory Visit: Payer: Self-pay | Admitting: Family Medicine

## 2013-05-21 DIAGNOSIS — R935 Abnormal findings on diagnostic imaging of other abdominal regions, including retroperitoneum: Secondary | ICD-10-CM

## 2013-05-28 ENCOUNTER — Ambulatory Visit
Admission: RE | Admit: 2013-05-28 | Discharge: 2013-05-28 | Disposition: A | Discharge status: Home / Self Care | Payer: BC Managed Care – PPO | Source: Ambulatory Visit | Attending: Family Medicine | Admitting: Family Medicine

## 2013-05-28 DIAGNOSIS — R935 Abnormal findings on diagnostic imaging of other abdominal regions, including retroperitoneum: Secondary | ICD-10-CM

## 2013-05-28 MED ORDER — GADOBENATE DIMEGLUMINE 529 MG/ML IV SOLN
18.0000 mL | Freq: Once | INTRAVENOUS | Status: AC | PRN
  Administered 2013-05-28: 18 mL via INTRAVENOUS

## 2013-11-06 ENCOUNTER — Encounter (INDEPENDENT_AMBULATORY_CARE_PROVIDER_SITE_OTHER): Payer: Self-pay

## 2013-11-06 ENCOUNTER — Ambulatory Visit (INDEPENDENT_AMBULATORY_CARE_PROVIDER_SITE_OTHER): Payer: BC Managed Care – PPO | Admitting: Cardiovascular Disease

## 2013-11-06 ENCOUNTER — Encounter: Payer: Self-pay | Admitting: Cardiovascular Disease

## 2013-11-06 ENCOUNTER — Ambulatory Visit (INDEPENDENT_AMBULATORY_CARE_PROVIDER_SITE_OTHER)
Admission: RE | Admit: 2013-11-06 | Discharge: 2013-11-06 | Disposition: A | Discharge status: Home / Self Care | Payer: BC Managed Care – PPO | Source: Ambulatory Visit | Attending: Cardiovascular Disease | Admitting: Cardiovascular Disease

## 2013-11-06 VITALS — BP 170/100 | HR 68 | Ht 70.0 in | Wt 211.0 lb

## 2013-11-06 DIAGNOSIS — Z79899 Other long term (current) drug therapy: Secondary | ICD-10-CM

## 2013-11-06 DIAGNOSIS — I10 Essential (primary) hypertension: Secondary | ICD-10-CM

## 2013-11-06 DIAGNOSIS — Z8249 Family history of ischemic heart disease and other diseases of the circulatory system: Secondary | ICD-10-CM

## 2013-11-06 DIAGNOSIS — R079 Chest pain, unspecified: Secondary | ICD-10-CM | POA: Insufficient documentation

## 2013-11-06 DIAGNOSIS — F411 Generalized anxiety disorder: Secondary | ICD-10-CM

## 2013-11-06 DIAGNOSIS — F419 Anxiety disorder, unspecified: Secondary | ICD-10-CM

## 2013-11-06 DIAGNOSIS — E785 Hyperlipidemia, unspecified: Secondary | ICD-10-CM

## 2013-11-06 LAB — LIPID PANEL: Cholesterol: 220 mg/dL — ABNORMAL HIGH (ref 0–200)

## 2013-11-06 MED ORDER — LOSARTAN POTASSIUM 25 MG PO TABS: 25.0000 mg | ORAL_TABLET | Freq: Every day | ORAL | Status: DC

## 2013-11-06 NOTE — Progress Notes (Signed)
Patient ID: Alan Shepherd, male   DOB: [birth year], 52 y.o.   MRN: [id number]  52 yo self referred Stressed with wife's bipolar disease Going through divorce  BP has been elevated  He appears a bit hyper or manic himself BP also running high at home Atypical left chest/shoulder and arm pain. 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  BP has been up for a few months but not RX         ROS: Denies fever, malais, weight loss, blurry vision, decreased visual acuity, cough, sputum, SOB, hemoptysis, pleuritic pain, palpitaitons, heartburn, abdominal pain, melena, lower extremity edema, claudication, or rash.  All other systems reviewed and negative   General: Affect appropriate Healthy:  appears stated age HEENT: normal Neck supple with no adenopathy JVP normal no bruits no thyromegaly Lungs clear with no wheezing and good diaphragmatic motion Heart:  S1/S2 no murmur,rub, gallop or click PMI normal Abdomen: benighn, BS positve, no tenderness, no AAA no bruit.  No HSM or HJR Distal pulses intact with no bruits No edema Neuro non-focal Skin warm and dry No muscular weakness  Medications Current Outpatient Prescriptions  Medication Sig Dispense Refill  . NON FORMULARY NO meds at this time       No current facility-administered medications for this visit.    Allergies Aspirin  Family History: No family history on file.  Social History: History   Social History  . Marital Status: Legally Separated    Spouse Name: N/A    Number of Children: N/A  . Years of Education: N/A   Occupational History  . Not on file.   Social History Main Topics  . Smoking status: Never Smoker   . Smokeless tobacco: Never Used  . Alcohol Use: No  . Drug Use: No  . Sexual Activity:    Other Topics Concern  . Not on file   Social History Narrative  . No narrative on file    Electrocardiogram:  SR rate 68 normal     Assessment and Plan

## 2013-11-06 NOTE — Patient Instructions (Signed)
Please start Losartan 25 mg a day. Continue all other medications as listed.  Your physician has requested that you have an exercise tolerance test. For further information please visit [email or web address] Please also follow instruction sheet, as given.  Please have a Calcium Score today  (if possible)  Please have blood work today (lipid) and BMP in 4 weeks.  Follow up as needed with Dr Eden Emms.

## 2013-11-06 NOTE — Assessment & Plan Note (Signed)
STart cozaar 25 mg with f/u BMET in 4 weeks  Will see how BP responds during ETT

## 2013-11-06 NOTE — Assessment & Plan Note (Signed)
Check cholesterol and calcium score today.  Calcium score for early detection and to see if statin Rx warranted

## 2013-11-06 NOTE — Assessment & Plan Note (Signed)
Atypical family history and HTN  Normal ECG  F/U ETT

## 2013-11-06 NOTE — Assessment & Plan Note (Signed)
May need more help with this This drives his BP  F/u primary

## 2013-11-10 ENCOUNTER — Telehealth: Payer: Self-pay | Admitting: *Deleted

## 2013-11-10 ENCOUNTER — Ambulatory Visit (HOSPITAL_COMMUNITY)
Admission: RE | Admit: 2013-11-10 | Discharge: 2013-11-10 | Disposition: A | Discharge status: Home / Self Care | Payer: BC Managed Care – PPO | Source: Ambulatory Visit | Attending: Cardiovascular Disease | Admitting: Cardiovascular Disease

## 2013-11-10 ENCOUNTER — Other Ambulatory Visit: Payer: Self-pay | Admitting: *Deleted

## 2013-11-10 DIAGNOSIS — Z8249 Family history of ischemic heart disease and other diseases of the circulatory system: Secondary | ICD-10-CM

## 2013-11-10 DIAGNOSIS — E7800 Pure hypercholesterolemia, unspecified: Secondary | ICD-10-CM

## 2013-11-10 MED ORDER — ATORVASTATIN CALCIUM 40 MG PO TABS: 40.0000 mg | ORAL_TABLET | Freq: Every day | ORAL | Status: DC

## 2013-11-10 NOTE — Telephone Encounter (Signed)
Message copied by Barrie Folk on Tue Nov 10, 2013 10:49 AM ------      Message from: Wendall Stade      Created: Sat Nov 07, 2013 12:34 AM       LDL very high start lipitor 40 mg LFTs in 8 weeks and repeat lipids in 12 weeks ------

## 2013-11-10 NOTE — Telephone Encounter (Signed)
I spoke with pt about CT & lab results. Copy mailed to pt & sent to pcp.  Pt states he has been taking a Z pack & has not started the Losartan yet.   BP today 130/95  Pt concerned about starting the Losartan while on the Z pack & plans on starting this end of the week.  Pt states he has been under a great deal of stress lately & felt this may be the reason for elevated blood pressure. He is also having a stress test (?unsure where) this week   He will keep a blood pressure log & return call with readings.  Lab work readings pt is reluctant but will start Atorvastatin 40mg  in January. Reminder & instructions mailed to pt about lab work. Mylo Red RN

## 2013-11-16 ENCOUNTER — Telehealth: Payer: Self-pay | Admitting: *Deleted

## 2013-11-16 NOTE — Telephone Encounter (Signed)
[name] ','<More Detail >>       Wendall Stade, MD       Sent: Caleen Essex November 13, 2013 11:07 PM    To: Alois Cliche, LPN                   Message     Normal stress test                     Results Exercise Tolerance Test (Order [id number])      Exercise Tolerance Test   Status: Final result Visible to patient: This result is viewable by the patient in MyChart. Next appt: 12/04/2013 at 08:00 AM in Cardiology ([facility])         Specimen Collected: 11/10/13 12:13 PM Last Resulted: 11/10/13 6:48 PM          Imaging Results     View Image               Result Information     Status     Final result (11/10/2013 6:48 PM)     Provider Status: Ordered    Encounter      View Encounter                Lab Information     [facility]               Order-Level Documents:     There are no order-level documents.         Exercise Tolerance Test (Order [id number]) Released By: Auto-released Date: 11/10/2013  ECG Authorizing: Provider Default, MD Department: [facility] Matilde Sprang  Order: [id number]         Provider Default, MD  NPI:         Order Information     Order Date/Time Release Date/Time Start Date/Time End Date/Time    11/10/13 12:13 PM 11/10/13 12:13 PM 11/10/13 12:13 PM 11/10/13 12:13 PM           Order Details     Frequency Duration Priority Order Class    Once 1 occurrence Routine Normal           Comments     Ordered by Charlton Haws           Order History Inpatient     Date/Time Action Taken User Additional Information    11/10/13 1213 Release  From Order: [id number]    11/10/13 1848 Result [facility] Interface Final           Collection Information     Collected: 11/10/2013 12:13 PM Resulting Agency: [facility]          Patient Information     Patient Name Sex DOB SSN    Alan Shepherd, Alan Shepherd Male May 09, 1961 [id number]           Additional Information     Associated Reports    View Parent Encounter    Priority and Order Details    Collection Information.           Order Building surveyor [facility] Interface -- -- --    Barrister's clerk Default, MD [id number] -- --            Encounter     View Encounter  Order-Level Documents:     There are no order-level documents.         PT  AWARE OF GTT  RESULTS .Zack Seal

## 2013-12-03 ENCOUNTER — Telehealth: Payer: Self-pay | Admitting: Cardiovascular Disease

## 2013-12-03 NOTE — Telephone Encounter (Signed)
New Message  Pt called states that he has stopped taking his Losartan and Lipitor because it was making him feel dizzy an nauseous // He is requesting a call back for an alternative medication . He also asks if it is OK to stop taking the medication and change his eating habits and will he need the lab appt on 12/04/2013. Please call back.

## 2013-12-03 NOTE — Telephone Encounter (Signed)
His BP went over 200 on his stress test His LDL is 169 and wont be well Rx with just diet His choice but should be Rx  I can call in different meds?

## 2013-12-03 NOTE — Telephone Encounter (Signed)
SPOKE  WITH  PT  FOR LONG  TIME  HAD ONLY TAKEN LOSARTAN FOR  4  DAYS  AND  FELT  BAD  SO HAS  NOT  TAKEN  SINCE  LAST THURS. ALSO DID NOT TOLERATE    LIPITOR AS  WELL    B/P  TODAY WAS  131/92 PER  PT  WOULD  LIKE TO TRY  DIET  EXERCISE  AND  WEIGHT  LOSS. FOR B/P AND CHOLESTEROL LEVELS  WILL FORWARD TO  DR Eden EmmsNISHAN  FOR REVIEW./CY

## 2013-12-04 ENCOUNTER — Other Ambulatory Visit: Payer: BC Managed Care – PPO

## 2013-12-09 NOTE — Telephone Encounter (Signed)
SPOKE WITH  PT  RE  MESSAGE PT WAS  ACTUALLY GOING TO CALL   TODAY RE  ELEVATED  B/P  WAS  INFORMING PT  OF  DR NISHAN'S  RECOMMENDATIONS  WILL FORWARD NOTE  TO SEE  WHAT  HE  RECOMMENDS  FOR NEW B/P MED  AND  PT  NEVER DID  PICK UP  LIPITOR HAS TRIED IN PAST AND  DID NOT  TOLERATE  C/O  MUSCLE  PAIN.

## 2013-12-09 NOTE — Telephone Encounter (Signed)
Can try diovan 160mg  daily if he does not want to take cozaar

## 2013-12-10 MED ORDER — VALSARTAN 160 MG PO TABS: 160.0000 mg | ORAL_TABLET | Freq: Every day | ORAL | Status: DC

## 2013-12-10 NOTE — Telephone Encounter (Signed)
PT  NOTIFIED ./CY 

## 2013-12-14 ENCOUNTER — Other Ambulatory Visit (FREE_STANDING_LABORATORY_FACILITY): Payer: Self-pay | Admitting: EXTERNAL

## 2013-12-14 ENCOUNTER — Encounter (FREE_STANDING_LABORATORY_FACILITY): Admit: 2013-12-14 | Discharge: 2013-12-14 | Disposition: A | Discharge status: Home / Self Care | Payer: Self-pay

## 2013-12-14 DIAGNOSIS — K209 Esophagitis, unspecified without bleeding: Secondary | ICD-10-CM

## 2013-12-16 LAB — HISTORICAL SURGICAL PATHOLOGY SPECIMEN

## 2015-04-01 ENCOUNTER — Emergency Department (HOSPITAL_BASED_OUTPATIENT_CLINIC_OR_DEPARTMENT_OTHER): Payer: BLUE CROSS/BLUE SHIELD

## 2015-04-01 ENCOUNTER — Emergency Department (HOSPITAL_BASED_OUTPATIENT_CLINIC_OR_DEPARTMENT_OTHER)
Admission: EM | Admit: 2015-04-01 | Discharge: 2015-04-01 | Disposition: A | Discharge status: Home / Self Care | Payer: BLUE CROSS/BLUE SHIELD | Attending: Emergency Medicine | Admitting: Emergency Medicine

## 2015-04-01 ENCOUNTER — Encounter (HOSPITAL_BASED_OUTPATIENT_CLINIC_OR_DEPARTMENT_OTHER): Payer: Self-pay | Admitting: *Deleted

## 2015-04-01 DIAGNOSIS — Z87442 Personal history of urinary calculi: Secondary | ICD-10-CM | POA: Insufficient documentation

## 2015-04-01 DIAGNOSIS — Z79899 Other long term (current) drug therapy: Secondary | ICD-10-CM | POA: Diagnosis not present

## 2015-04-01 DIAGNOSIS — R1031 Right lower quadrant pain: Secondary | ICD-10-CM | POA: Diagnosis not present

## 2015-04-01 DIAGNOSIS — R1030 Lower abdominal pain, unspecified: Secondary | ICD-10-CM | POA: Diagnosis present

## 2015-04-01 LAB — BASIC METABOLIC PANEL
Anion gap: 9 (ref 5–15)
BUN: 18 mg/dL (ref 6–20)
CO2: 26 mmol/L (ref 22–32)
Calcium: 9.5 mg/dL (ref 8.9–10.3)
Chloride: 107 mmol/L (ref 101–111)
Creatinine, Ser: 1.19 mg/dL (ref 0.61–1.24)
GFR calc Af Amer: >60 mL/min (ref >60)
GFR calc non Af Amer: >60 mL/min (ref >60)
Glucose, Bld: 98 mg/dL (ref 65–99)
Potassium: 3.9 mmol/L (ref 3.5–5.1)
Sodium: 142 mmol/L (ref 135–145)

## 2015-04-01 MED ORDER — SODIUM CHLORIDE 0.9 % IV BOLUS (SEPSIS)
1000.0000 mL | Freq: Once | INTRAVENOUS | Status: AC
  Administered 2015-04-01: 1000 mL via INTRAVENOUS

## 2015-04-01 MED ORDER — IOHEXOL 300 MG/ML  SOLN
100.0000 mL | Freq: Once | INTRAMUSCULAR | Status: AC | PRN
  Administered 2015-04-01: 100 mL via INTRAVENOUS

## 2015-04-01 NOTE — ED Notes (Signed)
Patient transported to CT 

## 2015-04-01 NOTE — ED Provider Notes (Signed)
CSN: [id number]     Arrival date & time 04/01/15  1753 History  This chart was scribed for Raeford RazorStephen Darian Cansler, MD by Octavia HeirArianna Nassar, ED Scribe. This patient was seen in room MH04/MH04 and the patient's care was started at 6:13 PM.    Chief Complaint  Patient presents with  . Flank Pain     The history is provided by the patient. No language interpreter was used.    HPI Comments: Alan Shepherd is a 54 y.o. male with prior medical history of kidney stones presented to the Emergency Department complaining of dull, constant, RLQ abdominal pain onset a few days ago that is painful when pressure is applied to the area. Patient states he went to his PCP earlier today to examine his abdomen and was sent to the ED for further evaluation. He has associated symptoms of diaphoresis. Patient denies urinary symptoms. Patient is allergic to aspirin and he is a non-smoker.    Past Medical History  Diagnosis Date  . Kidney stones    Past Surgical History  Procedure Laterality Date  . Kidney stone surgery     History reviewed. No pertinent family history. History  Substance Use Topics  . Smoking status: Never Smoker   . Smokeless tobacco: Never Used  . Alcohol Use: No    Review of Systems  Constitutional: Positive for diaphoresis.  Gastrointestinal: Positive for abdominal pain.  Genitourinary: Negative for dysuria, frequency and hematuria.  All other systems reviewed and are negative.     Allergies  Aspirin  Home Medications   Prior to Admission medications   Medication Sig Start Date End Date Taking? Authorizing Provider  atorvastatin (LIPITOR) 40 MG tablet Take 1 tablet (40 mg total) by mouth at bedtime. 11/10/13   Wendall StadePeter C Nishan, MD  NON FORMULARY NO meds at this time    Historical Provider, MD  valsartan (DIOVAN) 160 MG tablet Take 1 tablet (160 mg total) by mouth daily. 12/10/13   Wendall StadePeter C Nishan, MD   Triage Vitals: BP 165/90 mmHg  Pulse 72  Temp(Src) 98.1 F (36.7 C) (Oral)   Resp 16  Ht 5\' 11"  (1.803 m)  Wt 210 lb (95.255 kg)  BMI 29.30 kg/m2  SpO2 100%  Physical Exam  Constitutional: He appears well-developed and well-nourished. No distress.  HENT:  Head: Normocephalic and atraumatic.  Eyes: Right eye exhibits no discharge. Left eye exhibits no discharge.  Cardiovascular: Normal rate, regular rhythm and normal heart sounds.   No murmur heard. Pulmonary/Chest: Effort normal and breath sounds normal. No respiratory distress.  Abdominal: There is tenderness. There is no rebound and no guarding.  Mild to moderate RLQ tenderness  Neurological: He is alert. Coordination normal.  Skin: No rash noted. He is not diaphoretic.  Psychiatric: He has a normal mood and affect. His behavior is normal.  Nursing note and vitals reviewed.   ED Course  Procedures (including critical care time)  DIAGNOSTIC STUDIES: Oxygen Saturation is 100% on room air, normal by my interpretation.  COORDINATION OF CARE:  6:18 PM Will order a CT scan with IV constrast. Patient agrees.  Labs Review Labs Reviewed  BASIC METABOLIC PANEL    Imaging Review No results found.   Ct Abdomen Pelvis W Contrast  04/01/2015   CLINICAL DATA:  Right lower quadrant pain.  Onset 2 days prior.  EXAM: CT ABDOMEN AND PELVIS WITH CONTRAST  TECHNIQUE: Multidetector CT imaging of the abdomen and pelvis was performed using the standard protocol following bolus administration of  intravenous contrast.  CONTRAST:  100 mL Omnipaque  COMPARISON:  Abdominal MRI 05/28/2013, CT 05/21/1999 14  FINDINGS: Lower chest: Lung bases are clear.  Hepatobiliary: No focal hepatic lesion. No biliary duct dilatation. Gallbladder is normal. Common bile duct is normal.  Pancreas: Pancreas is normal. No ductal dilatation. No pancreatic inflammation.  Spleen: Normal spleen  Adrenals/urinary tract: Adrenal glands are normal. Bilateral renal cysts are again demonstrated. Normal uterus and bladder. No obstructive uropathy.   Stomach/Bowel: Stomach, duodenum, small bowel, cecum are normal. The appendix is small but and not inflamed (the orifice is seen on image 61, series 2.  Vascular/Lymphatic: Abdominal aorta is normal caliber. There is no retroperitoneal or periportal lymphadenopathy. No pelvic lymphadenopathy.  Reproductive: Prostate gland is normal.  Musculoskeletal: No aggressive osseous lesion.  Other: No inguinal hernia or abdominal hernia.  IMPRESSION: 1. No acute abdominal or pelvic findings. 2. No evidence appendicitis. 3. No ureteral obstruction.   Electronically Signed   By: Genevive BiStewart  Edmunds M.D.   On: 04/01/2015 20:10     EKG Interpretation None      MDM   Final diagnoses:  RLQ abdominal pain    54yM with RLQ pain. Etiology not completely clear. Possible abdominal wall strain. Labs from PCP including CBC and Urine unremarkable. CT w/o acute explanatory pathology. It has been determined that no acute conditions requiring further emergency intervention are present at this time. The patient has been advised of the diagnosis and plan. I reviewed any labs and imaging including any potential incidental findings. We have discussed signs and symptoms that warrant return to the ED and they are listed in the discharge instructions.    I personally preformed the services scribed in my presence. The recorded information has been reviewed is accurate. Raeford RazorStephen Brailen Macneal, MD.    Raeford RazorStephen Elley Harp, MD 04/06/15 [phone]

## 2015-04-01 NOTE — Discharge Instructions (Signed)

## 2015-04-01 NOTE — ED Notes (Signed)
Sent here from PMD for left flank pain hx kidney stones

## 2015-12-13 ENCOUNTER — Telehealth: Payer: Self-pay | Admitting: Cardiovascular Disease

## 2015-12-13 NOTE — Telephone Encounter (Signed)
Spoke with pt. Not sure about times and if he should take medications with food. Pt has appt. With Dr. Eden Emms on 1/26 at 0800.

## 2015-12-13 NOTE — Telephone Encounter (Signed)
New Message  Pt c/o of feeling lightheaded. Pt sched for appt 1/26 w/ Dr Eden Emms. Pt taking BP meds per Hyacinth Meeker @ [employer]- amlodiprine and atenolol. Please call back and discuss.

## 2015-12-14 NOTE — Progress Notes (Signed)
Patient ID: Alan Shepherd, male   DOB: 05-13-1961, 55 y.o.   MRN: [id number]  55 y.o. self referred Stressed with wife's bipolar disease Going through divorce  BP has been elevated  He appears a bit hyper or manic himself BP also running high at home Atypical left chest/shoulder and arm pain. 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  BP has been up for a few months but not RX    11/10/13  ETT normal   ROS: Denies fever, malais, weight loss, blurry vision, decreased visual acuity, cough, sputum, SOB, hemoptysis, pleuritic pain, palpitaitons, heartburn, abdominal pain, melena, lower extremity edema, claudication, or rash.  All other systems reviewed and negative   General: Affect appropriate Healthy:  appears stated age HEENT: normal Neck supple with no adenopathy JVP normal no bruits no thyromegaly Lungs clear with no wheezing and good diaphragmatic motion Heart:  S1/S2 no murmur,rub, gallop or click PMI normal Abdomen: benighn, BS positve, no tenderness, no AAA no bruit.  No HSM or HJR Distal pulses intact with no bruits No edema Neuro non-focal Skin warm and dry No muscular weakness  Medications Current Outpatient Prescriptions  Medication Sig Dispense Refill  . amLODipine (NORVASC) 5 MG tablet Take 5 mg by mouth daily.    Marland Kitchen atenolol (TENORMIN) 50 MG tablet Take 50 mg by mouth 2 (two) times daily.  0   No current facility-administered medications for this visit.    Allergies Aspirin  Family History: Family History  Problem Relation Age of Onset  . Family history unknown: Yes    Social History: Social History   Social History  . Marital Status: Legally Separated    Spouse Name: N/A  . Number of Children: N/A  . Years of Education: N/A   Occupational History  . Not on file.   Social History Main Topics  . Smoking status: Never Smoker   . Smokeless tobacco: Never Used  . Alcohol Use:  No  . Drug Use: No  . Sexual Activity: Not on file   Other Topics Concern  . Not on file   Social History Narrative    Electrocardiogram:  2014   SR rate 68 normal   Assessment and Plan HTN:  Improved on current meds driven by anxiety f/u Dr Modesto Charon Anxiety:  Divorced from wife who was bipolar Son 26 with drug issues in [place]  Encouraged him to see psychologist Chol:  On statin f/u with primary for labs  [employer]

## 2015-12-15 ENCOUNTER — Ambulatory Visit (INDEPENDENT_AMBULATORY_CARE_PROVIDER_SITE_OTHER): Payer: BLUE CROSS/BLUE SHIELD | Admitting: Cardiovascular Disease

## 2015-12-15 ENCOUNTER — Encounter: Payer: Self-pay | Admitting: Cardiovascular Disease

## 2015-12-15 VITALS — BP 140/80 | HR 65 | Resp 18 | Ht 71.0 in | Wt 200.0 lb

## 2015-12-15 DIAGNOSIS — I10 Essential (primary) hypertension: Secondary | ICD-10-CM

## 2015-12-15 DIAGNOSIS — F419 Anxiety disorder, unspecified: Secondary | ICD-10-CM

## 2015-12-15 NOTE — Patient Instructions (Addendum)

## 2016-03-14 ENCOUNTER — Other Ambulatory Visit: Payer: Self-pay | Admitting: Family Medicine

## 2016-07-21 DIAGNOSIS — R110 Nausea: Secondary | ICD-10-CM | POA: Diagnosis not present

## 2016-07-21 DIAGNOSIS — R410 Disorientation, unspecified: Secondary | ICD-10-CM | POA: Insufficient documentation

## 2016-07-21 DIAGNOSIS — R6883 Chills (without fever): Secondary | ICD-10-CM | POA: Diagnosis not present

## 2016-07-21 NOTE — ED Triage Notes (Signed)
Pt sts that he is being treated for [place] spotted fever and ehrlichiosis.  Pt sts that today he developed chills and nausea.  Pt sts that he last took ribfbampin on Thursday, has been taking since 07/09/16.  Pt A/Ox4, able to ambulate w/o issue, no difficulty speaking.  Denies CP, SOB, LOC or falls.  NAD.  Skin tone dry and even.  Pt sts that BP is normal but incr at MD's office.

## 2016-07-22 ENCOUNTER — Emergency Department
Admission: EM | Admit: 2016-07-22 | Discharge: 2016-07-22 | Disposition: A | Discharge status: Home / Self Care | Payer: BLUE CROSS/BLUE SHIELD | Attending: Emergency Medicine | Admitting: Emergency Medicine

## 2016-07-22 DIAGNOSIS — R110 Nausea: Secondary | ICD-10-CM

## 2016-07-22 DIAGNOSIS — R6883 Chills (without fever): Secondary | ICD-10-CM

## 2016-07-22 LAB — URINALYSIS COMPLETE WITH MICROSCOPIC (ARMC ONLY)
Bacteria, UA: NONE SEEN
Bilirubin Urine: NEGATIVE
Glucose, UA: NEGATIVE mg/dL
HGB URINE DIPSTICK: NEGATIVE
KETONES UR: NEGATIVE mg/dL
LEUKOCYTES UA: NEGATIVE
NITRITE: NEGATIVE
PH: 6 (ref 5.0–8.0)
PROTEIN: NEGATIVE mg/dL
SPECIFIC GRAVITY, URINE: 1.011 (ref 1.005–1.030)
Squamous Epithelial / LPF: NONE SEEN

## 2016-07-22 LAB — COMPREHENSIVE METABOLIC PANEL
ALT: 13 U/L — AB (ref 17–63)
AST: 19 U/L (ref 15–41)
Albumin: 4.6 g/dL (ref 3.5–5.0)
Alkaline Phosphatase: 55 U/L (ref 38–126)
Anion gap: 3 — ABNORMAL LOW (ref 5–15)
BUN: 19 mg/dL (ref 6–20)
CHLORIDE: 109 mmol/L (ref 101–111)
CO2: 28 mmol/L (ref 22–32)
CREATININE: 1.22 mg/dL (ref 0.61–1.24)
Calcium: 9.3 mg/dL (ref 8.9–10.3)
Glucose, Bld: 101 mg/dL — ABNORMAL HIGH (ref 65–99)
Potassium: 4 mmol/L (ref 3.5–5.1)
Sodium: 140 mmol/L (ref 135–145)
Total Bilirubin: 0.6 mg/dL (ref 0.3–1.2)
Total Protein: 7.5 g/dL (ref 6.5–8.1)

## 2016-07-22 LAB — CBC
HCT: 36.7 % — ABNORMAL LOW (ref 40.0–52.0)
Hemoglobin: 12.1 g/dL — ABNORMAL LOW (ref 13.0–18.0)
MCH: 21.2 pg — AB (ref 26.0–34.0)
MCHC: 32.8 g/dL (ref 32.0–36.0)
MCV: 64.7 fL — AB (ref 80.0–100.0)
PLATELETS: 160 10*3/uL (ref 150–440)
RBC: 5.67 MIL/uL (ref 4.40–5.90)
RDW: 16.3 % — AB (ref 11.5–14.5)
WBC: 6.5 10*3/uL (ref 3.8–10.6)

## 2016-07-22 MED ORDER — SODIUM CHLORIDE 0.9 % IV BOLUS (SEPSIS)
1000.0000 mL | Freq: Once | INTRAVENOUS | Status: AC
  Administered 2016-07-22: 1000 mL via INTRAVENOUS

## 2016-07-22 MED ORDER — ONDANSETRON 4 MG PO TBDP
4.0000 mg | ORAL_TABLET | Freq: Three times a day (TID) | ORAL | 0 refills | Status: DC | PRN
Start: 2016-07-22 — End: 2017-10-05

## 2016-07-22 NOTE — ED Notes (Signed)
Report given to oncoming RN, Rod Maeharles Woods.

## 2016-07-22 NOTE — ED Provider Notes (Signed)
[facility] Emergency Department Provider Note   ____________________________________________   First MD Initiated Contact with Patient 07/22/16 [phone]     (approximate)  I have reviewed the triage vital signs and the nursing notes.   HISTORY  Chief Complaint Nausea and Chills    HPI Alan Shepherd is a 55 y.o. male comes into the hospital today with chills and nausea. The patient reports that he was diagnosed with [facility] spotted fever in June. He reports that he took doxycycline for 28 days. He felt as though he was doing well initially but then he started getting worse. The patient went to [facility] where he was diagnosed with ehrlichiosis. He reports that he was given rifampin on 8/21. He reports he took one pill daily for 2 days but started feeling unwell. He reports that he missed it on the third day and then started taking it again on the fourth and fifth days. He reports that he continued to feel unwell after taking the medication. He reports that he would have some nausea, brain fog, fatigue and brain tingling. His doctor suggested some salt water baths as well as a coffee ground enemas to help detoxify his liver from the medication. The patient reports that the salt baths seem to help but he was not comfortable doing the enemas. He reports that he started back tried to take the medication at half a pill a day but he again started feeling bad. He reports that he last took the rifampin on Thursday. He is unsure if he's been getting better or if he is getting worse. He reports that his left back feels tight and that he woke up this morning not feeling well. He said that he cut down some trees but then had some chills and shakes. He ate and felt better but then this evening had the symptoms again so he decided to come in to the hospital to get checked out.   Past Medical History:  Diagnosis Date  . Kidney stones     Patient Active Problem  List   Diagnosis Date Noted  . HTN (hypertension) 11/06/2013  . Chest pain 11/06/2013  . Anxiety 11/06/2013  . Family history of early CAD 11/06/2013    Past Surgical History:  Procedure Laterality Date  . KIDNEY STONE SURGERY      Prior to Admission medications   Medication Sig Start Date End Date Taking? Authorizing Provider  amLODipine (NORVASC) 5 MG tablet Take 5 mg by mouth daily.    Historical Provider, MD  atenolol (TENORMIN) 50 MG tablet Take 50 mg by mouth 2 (two) times daily. 12/09/15   Historical Provider, MD  ondansetron (ZOFRAN ODT) 4 MG disintegrating tablet Take 1 tablet (4 mg total) by mouth every 8 (eight) hours as needed for nausea or vomiting. 07/22/16   Rebecka Apley, MD    Allergies Aspirin  Family History  Problem Relation Age of Onset  . Family history unknown: Yes    Social History Social History  Substance Use Topics  . Smoking status: Never Smoker  . Smokeless tobacco: Never Used  . Alcohol use No    Review of Systems Constitutional: chills Eyes: No visual changes. ENT: No sore throat. Cardiovascular: Denies chest pain. Respiratory: Denies shortness of breath. Gastrointestinal: Nausea, No abdominal pain.  no vomiting.  No diarrhea.  No constipation. Genitourinary: Negative for dysuria. Musculoskeletal: Negative for back pain. Skin: Negative for rash. Neurological: Brain fog  10-point ROS otherwise negative.  ____________________________________________   PHYSICAL EXAM:  VITAL SIGNS: ED Triage Vitals  Enc Vitals Group     BP 07/21/16 2311 (!) 179/113     Pulse Rate 07/21/16 2311 94     Resp 07/21/16 2311 18     Temp 07/21/16 2311 97.7 F (36.5 C)     Temp Source 07/21/16 2311 Oral     SpO2 07/21/16 2311 99 %     Weight 07/21/16 2317 184 lb (83.5 kg)     Height 07/21/16 2317 5\' 11"  (1.803 m)     Head Circumference --      Peak Flow --      Pain Score 07/21/16 2318 0     Pain Loc --      Pain Edu? --      Excl. in GC? --      Constitutional: Alert and oriented. Well appearing and in mild distress. Eyes: Conjunctivae are normal. PERRL. EOMI. Head: Atraumatic. Nose: No congestion/rhinnorhea. Mouth/Throat: Mucous membranes are moist.  Oropharynx non-erythematous. Cardiovascular: Normal rate, regular rhythm. Grossly normal heart sounds.  Good peripheral circulation. Respiratory: Normal respiratory effort.  No retractions. Lungs CTAB. Gastrointestinal: Soft and nontender. No distention. Positive bowel sounds Musculoskeletal: No lower extremity tenderness nor edema.   Neurologic:  Normal speech and language.  Skin:  Skin is warm, dry and intact. No rash noted. Psychiatric: Mood and affect are normal.   ____________________________________________   LABS (all labs ordered are listed, but only abnormal results are displayed)  Labs Reviewed  CBC - Abnormal; Notable for the following:       Result Value   Hemoglobin 12.1 (*)    HCT 36.7 (*)    MCV 64.7 (*)    MCH 21.2 (*)    RDW 16.3 (*)    All other components within normal limits  COMPREHENSIVE METABOLIC PANEL - Abnormal; Notable for the following:    Glucose, Bld 101 (*)    ALT 13 (*)    Anion gap 3 (*)    All other components within normal limits  URINALYSIS COMPLETEWITH MICROSCOPIC ([facility] ONLY) - Abnormal; Notable for the following:    Color, Urine STRAW (*)    APPearance CLEAR (*)    All other components within normal limits   ____________________________________________  EKG  none ____________________________________________  RADIOLOGY  none ____________________________________________   PROCEDURES  Procedure(s) performed: None  Procedures  Critical Care performed: No  ____________________________________________   INITIAL IMPRESSION / ASSESSMENT AND PLAN / ED COURSE  Pertinent labs & imaging results that were available during my care of the patient were reviewed by me and considered in my medical decision making (see  chart for details).  This is a 55 year old male who comes into the hospital today with some chills and nausea. He reports that he was having the nausea more so with the medication that he was taking which is rifampin but he is also had some chills after not taking the medications. I will check a CBC and a CMP. I will also check the patient's urinalysis and give him a liter of normal saline. I will reassess the patient once I received the blood work results.  Clinical Course   The patient's blood work is unremarkable. I encouraged the patient to continue taking his rifampin and to take some nausea medicine to help with the symptoms. The patient seemed concerned because he did not like the medication but I informed him that that would be the best course of action and to follow back  up. He again was asking how would he know that the infection was out of his system and I informed him that he really needs to finish taking his medication. The patient will be discharged to follow-up with his primary care physician.  ____________________________________________   FINAL CLINICAL IMPRESSION(S) / ED DIAGNOSES  Final diagnoses:  Nausea  Chills      NEW MEDICATIONS STARTED DURING THIS VISIT:  Discharge Medication List as of 07/22/2016  4:32 AM    START taking these medications   Details  ondansetron (ZOFRAN ODT) 4 MG disintegrating tablet Take 1 tablet (4 mg total) by mouth every 8 (eight) hours as needed for nausea or vomiting., Starting Sun 07/22/2016, Print         Note:  This document was prepared using Dragon voice recognition software and may include unintentional dictation errors.    Rebecka Apley, MD 07/22/16 [phone]

## 2016-07-22 NOTE — ED Notes (Signed)
Patient presents with complaints of chills and shaking all over today. [place]. States he has been on doxycycline and Rifampin but the Rifampin has made him feel so bad he has metered the dose to take 1/2 a day and maybe even skip a day. He is unsure if its the medicine making him feel this way or if the "erlichiosis is coming back."  Patient pulls out a folder of notes that include dates, times and names of medications that he has been taking for the RMSF. States they have been following him with blood work and medications since the diagnosis in June of this year. Patient is a pleasant, well spoken gentleman who states he has white coat hypertension as well and would appreciate it if we didn't report his elevated blood pressure to [facility]. Explained to patient that we do not report to anyone any medical information without his permission.

## 2017-04-04 LAB — CBC AND DIFFERENTIAL
HCT: 37 — AB (ref 41–53)
Hemoglobin: 11.7 — AB (ref 13.5–17.5)
Platelets: 180 (ref 150–399)
WBC: 5.8

## 2017-04-04 LAB — PSA: PSA: 0.8

## 2017-04-04 LAB — LIPID PANEL
CHOLESTEROL: 210 — AB (ref 0–200)
HDL: 47 (ref 35–70)
LDL Cholesterol: 126
Triglycerides: 186 — AB (ref 40–160)

## 2017-10-04 ENCOUNTER — Other Ambulatory Visit: Payer: Self-pay | Admitting: Urology

## 2017-10-04 ENCOUNTER — Encounter (HOSPITAL_COMMUNITY): Payer: Self-pay | Admitting: *Deleted

## 2017-10-04 ENCOUNTER — Encounter (HOSPITAL_COMMUNITY)
Admission: AD | Disposition: A | Discharge status: Home / Self Care | Payer: Self-pay | Source: Ambulatory Visit | Attending: Urology

## 2017-10-04 ENCOUNTER — Ambulatory Visit (HOSPITAL_COMMUNITY): Payer: BLUE CROSS/BLUE SHIELD | Admitting: Certified Registered Nurse Anesthetist

## 2017-10-04 ENCOUNTER — Observation Stay (HOSPITAL_COMMUNITY): Payer: BLUE CROSS/BLUE SHIELD

## 2017-10-04 ENCOUNTER — Observation Stay (HOSPITAL_COMMUNITY)
Admission: AD | Admit: 2017-10-04 | Discharge: 2017-10-05 | Disposition: A | Discharge status: Home / Self Care | Payer: BLUE CROSS/BLUE SHIELD | Source: Ambulatory Visit | Attending: Urology | Admitting: Urology

## 2017-10-04 DIAGNOSIS — Z886 Allergy status to analgesic agent status: Secondary | ICD-10-CM | POA: Insufficient documentation

## 2017-10-04 DIAGNOSIS — Z79899 Other long term (current) drug therapy: Secondary | ICD-10-CM | POA: Diagnosis not present

## 2017-10-04 DIAGNOSIS — N132 Hydronephrosis with renal and ureteral calculous obstruction: Secondary | ICD-10-CM | POA: Diagnosis not present

## 2017-10-04 DIAGNOSIS — Z87442 Personal history of urinary calculi: Secondary | ICD-10-CM | POA: Diagnosis not present

## 2017-10-04 DIAGNOSIS — N201 Calculus of ureter: Secondary | ICD-10-CM | POA: Diagnosis present

## 2017-10-04 HISTORY — DX: Adverse effect of unspecified anesthetic, initial encounter: T41.45XA

## 2017-10-04 HISTORY — DX: Other complications of anesthesia, initial encounter: T88.59XA

## 2017-10-04 HISTORY — PX: CYSTOSCOPY W/ URETERAL STENT PLACEMENT: SHX1429

## 2017-10-04 HISTORY — DX: Essential (primary) hypertension: I10

## 2017-10-04 HISTORY — DX: Personal history of urinary calculi: Z87.442

## 2017-10-04 LAB — POCT I-STAT 4, (NA,K, GLUC, HGB,HCT)
Glucose, Bld: 92 mg/dL (ref 65–99)
HCT: 36 % — ABNORMAL LOW (ref 39.0–52.0)
Hemoglobin: 12.2 g/dL — ABNORMAL LOW (ref 13.0–17.0)
Potassium: 3.6 mmol/L (ref 3.5–5.1)
Sodium: 143 mmol/L (ref 135–145)

## 2017-10-04 SURGERY — CYSTOSCOPY, WITH RETROGRADE PYELOGRAM AND URETERAL STENT INSERTION
Anesthesia: General | Site: Ureter | Laterality: Left

## 2017-10-04 MED ORDER — OXYCODONE HCL 5 MG PO TABS: 5.0000 mg | ORAL_TABLET | ORAL | Status: DC | PRN

## 2017-10-04 MED ORDER — ONDANSETRON HCL 4 MG/2ML IJ SOLN
INTRAMUSCULAR | Status: DC | PRN
  Administered 2017-10-04: 4 mg via INTRAVENOUS

## 2017-10-04 MED ORDER — MIDAZOLAM HCL 2 MG/2ML IJ SOLN
INTRAMUSCULAR | Status: AC
  Filled 2017-10-04: qty 2

## 2017-10-04 MED ORDER — LABETALOL HCL 5 MG/ML IV SOLN: 10.0000 mg | INTRAVENOUS | Status: DC | PRN

## 2017-10-04 MED ORDER — SUCCINYLCHOLINE CHLORIDE 200 MG/10ML IV SOSY
PREFILLED_SYRINGE | INTRAVENOUS | Status: AC
Start: 2017-10-04 — End: 2017-10-04
  Filled 2017-10-04: qty 10

## 2017-10-04 MED ORDER — SODIUM CHLORIDE 0.9 % IV SOLN
INTRAVENOUS | Status: DC
  Administered 2017-10-04: 21:00:00 via INTRAVENOUS

## 2017-10-04 MED ORDER — MEPERIDINE HCL 50 MG/ML IJ SOLN: 6.2500 mg | INTRAMUSCULAR | Status: DC | PRN

## 2017-10-04 MED ORDER — ONDANSETRON HCL 4 MG/2ML IJ SOLN
INTRAMUSCULAR | Status: AC
  Filled 2017-10-04: qty 2

## 2017-10-04 MED ORDER — FENTANYL CITRATE (PF) 100 MCG/2ML IJ SOLN
INTRAMUSCULAR | Status: AC
  Filled 2017-10-04: qty 2

## 2017-10-04 MED ORDER — FENTANYL CITRATE (PF) 100 MCG/2ML IJ SOLN
INTRAMUSCULAR | Status: DC | PRN
  Administered 2017-10-04 (×2): 50 ug via INTRAVENOUS

## 2017-10-04 MED ORDER — SUGAMMADEX SODIUM 200 MG/2ML IV SOLN
INTRAVENOUS | Status: DC | PRN
  Administered 2017-10-04: 190 mg via INTRAVENOUS

## 2017-10-04 MED ORDER — PROPOFOL 10 MG/ML IV BOLUS
INTRAVENOUS | Status: DC | PRN
  Administered 2017-10-04: 200 mg via INTRAVENOUS

## 2017-10-04 MED ORDER — PROPOFOL 10 MG/ML IV BOLUS
INTRAVENOUS | Status: AC
  Filled 2017-10-04: qty 20

## 2017-10-04 MED ORDER — SUCCINYLCHOLINE CHLORIDE 200 MG/10ML IV SOSY
PREFILLED_SYRINGE | INTRAVENOUS | Status: DC | PRN
  Administered 2017-10-04: 180 mg via INTRAVENOUS

## 2017-10-04 MED ORDER — ONDANSETRON HCL 4 MG/2ML IJ SOLN
INTRAMUSCULAR | Status: AC
Start: 2017-10-04 — End: 2017-10-04
  Filled 2017-10-04: qty 2

## 2017-10-04 MED ORDER — CEFAZOLIN SODIUM-DEXTROSE 2-4 GM/100ML-% IV SOLN
INTRAVENOUS | Status: AC
  Filled 2017-10-04: qty 100

## 2017-10-04 MED ORDER — LACTATED RINGERS IV SOLN
INTRAVENOUS | Status: DC
  Administered 2017-10-04: 19:00:00 via INTRAVENOUS

## 2017-10-04 MED ORDER — ROCURONIUM BROMIDE 10 MG/ML (PF) SYRINGE
PREFILLED_SYRINGE | INTRAVENOUS | Status: DC | PRN
  Administered 2017-10-04: 5 mg via INTRAVENOUS
  Administered 2017-10-04: 20 mg via INTRAVENOUS

## 2017-10-04 MED ORDER — LABETALOL HCL 5 MG/ML IV SOLN
INTRAVENOUS | Status: AC
  Administered 2017-10-04: 5 mg via INTRAVENOUS
  Filled 2017-10-04: qty 4

## 2017-10-04 MED ORDER — HYDROMORPHONE HCL 1 MG/ML IJ SOLN: 0.5000 mg | INTRAMUSCULAR | Status: DC | PRN

## 2017-10-04 MED ORDER — SUGAMMADEX SODIUM 200 MG/2ML IV SOLN
INTRAVENOUS | Status: AC
  Filled 2017-10-04: qty 2

## 2017-10-04 MED ORDER — LABETALOL HCL 5 MG/ML IV SOLN
5.0000 mg | INTRAVENOUS | Status: DC | PRN
Start: 2017-10-04 — End: 2017-10-04
  Administered 2017-10-04: 5 mg via INTRAVENOUS

## 2017-10-04 MED ORDER — IOHEXOL 300 MG/ML  SOLN
INTRAMUSCULAR | Status: DC | PRN
  Administered 2017-10-04: 10 mL via URETHRAL

## 2017-10-04 MED ORDER — ONDANSETRON HCL 4 MG/2ML IJ SOLN: 4.0000 mg | Freq: Once | INTRAMUSCULAR | Status: DC | PRN

## 2017-10-04 MED ORDER — ONDANSETRON HCL 4 MG/2ML IJ SOLN: 4.0000 mg | INTRAMUSCULAR | Status: DC | PRN

## 2017-10-04 MED ORDER — PIPERACILLIN-TAZOBACTAM 3.375 G IVPB
3.3750 g | Freq: Three times a day (TID) | INTRAVENOUS | Status: DC
  Administered 2017-10-04 – 2017-10-05 (×2): 3.375 g via INTRAVENOUS
  Filled 2017-10-04 (×2): qty 50

## 2017-10-04 MED ORDER — STERILE WATER FOR IRRIGATION IR SOLN
Status: DC | PRN
  Administered 2017-10-04: 3000 mL

## 2017-10-04 MED ORDER — LIDOCAINE 2% (20 MG/ML) 5 ML SYRINGE
INTRAMUSCULAR | Status: AC
  Filled 2017-10-04: qty 5

## 2017-10-04 MED ORDER — BELLADONNA-OPIUM 16.2-30 MG RE SUPP: 30.0000 mg | Freq: Four times a day (QID) | RECTAL | Status: DC | PRN

## 2017-10-04 MED ORDER — HYDROMORPHONE HCL 1 MG/ML IJ SOLN: 0.2500 mg | INTRAMUSCULAR | Status: DC | PRN

## 2017-10-04 MED ORDER — LIDOCAINE 2% (20 MG/ML) 5 ML SYRINGE
INTRAMUSCULAR | Status: DC | PRN
  Administered 2017-10-04: 50 mg via INTRAVENOUS

## 2017-10-04 MED ORDER — LIDOCAINE HCL 2 % EX GEL
CUTANEOUS | Status: AC
  Filled 2017-10-04: qty 5

## 2017-10-04 MED ORDER — CEFAZOLIN SODIUM-DEXTROSE 2-4 GM/100ML-% IV SOLN
2.0000 g | INTRAVENOUS | Status: AC
  Administered 2017-10-04: 2 g via INTRAVENOUS

## 2017-10-04 MED ORDER — ACETAMINOPHEN 325 MG PO TABS: 650.0000 mg | ORAL_TABLET | ORAL | Status: DC | PRN

## 2017-10-04 SURGICAL SUPPLY — 14 items
BAG URO CATCHER STRL LF (MISCELLANEOUS) ×2 IMPLANT
CATH INTERMIT  6FR 70CM (CATHETERS) ×2 IMPLANT
CLOTH BEACON ORANGE TIMEOUT ST (SAFETY) ×2 IMPLANT
COVER FOOTSWITCH UNIV (MISCELLANEOUS) ×2 IMPLANT
COVER SURGICAL LIGHT HANDLE (MISCELLANEOUS) ×2 IMPLANT
GLOVE BIOGEL M 8.0 STRL (GLOVE) ×2 IMPLANT
GOWN STRL REUS W/ TWL XL LVL3 (GOWN DISPOSABLE) ×2 IMPLANT
GOWN STRL REUS W/TWL XL LVL3 (GOWN DISPOSABLE) ×2
GUIDEWIRE STR DUAL SENSOR (WIRE) ×2 IMPLANT
MANIFOLD NEPTUNE II (INSTRUMENTS) ×2 IMPLANT
PACK CYSTO (CUSTOM PROCEDURE TRAY) ×2 IMPLANT
STENT URET 6FRX26 CONTOUR (STENTS) ×2 IMPLANT
TUBING CONNECTING 10 (TUBING) ×2 IMPLANT
WATER STERILE IRR 3000ML UROMA (IV SOLUTION) ×2 IMPLANT

## 2017-10-04 NOTE — Op Note (Signed)
PATIENT:  Alan Shepherd  PRE-OPERATIVE DIAGNOSIS: 1.  Left ureteral stone. 2.  Possible UTI  POST-OPERATIVE DIAGNOSIS: Same  PROCEDURE: 1.  Cystoscopy 2.  Left retrograde pyelogram with interpretation. 3.  Left double-J stent placement 4.  Fluoroscopy time less than 1 hour.  SURGEON:  Garnett FarmMark C Delayne Sanzo  INDICATION: Alan Shepherd is a 56 year old male with a history of calculus disease who developed left flank pain and was found to have a calcification in the area of the left proximal ureter by KUB.  His stone has continued to cause intermittent pain and he returned to the office today with continued pain and low-grade fever suggesting possible infection.  He is therefore brought to the operating room for stent placement.  ANESTHESIA:  General  EBL:  Minimal  DRAINS: 6 French, 26 cm double-J stent in the left ureter (no string)  LOCAL MEDICATIONS USED:  None  SPECIMEN: None  Description of procedure: After informed consent the patient was taken to the operating room and placed on the table in a supine position. General anesthesia was then administered. Once fully anesthetized the patient was moved to the dorsal lithotomy position and the genitalia were sterilely prepped and draped in standard fashion. An official timeout was then performed.  The 23 French cystoscope with 30 degree lens was passed down the urethra which was noted to be normal.  The prostatic urethra revealed no lesions.  It was not obstructing.  The bladder was then entered and fully inspected.  It was noted to be free of any tumors, stones or inflammatory lesions.  Both ureteral orifices were noted to be of normal configuration and position.  A 6 French open-ended ureteral catheter was then passed through the cystoscope and into the left ureter.  Under direct fluoroscopy full-strength Omnipaque contrast was then injected gently up the left ureter.  A filling defect was noted in the proximal ureter consistent with the  known stone seen on previous imaging studies.  The ureter was otherwise completely normal as was the intrarenal collecting system.  A 0.038 inch sensor guidewire was then passed through the open-ended catheter and up into the area of the renal pelvis under fluoroscopy.  The open-ended catheter was removed and the stent was passed over the guidewire and up the ureter into the area of the renal pelvis and as the guidewire was removed good curl was noted in the renal pelvis and in the bladder.  The bladder was then drained, the cystoscope was removed and the patient was awakened and taken to the recovery room in stable and satisfactory condition.  He tolerated procedure well with no intraoperative complications.  PLAN OF CARE: Observe overnight with anticipated discharge in the morning.  PATIENT DISPOSITION:  PACU - hemodynamically stable.

## 2017-10-04 NOTE — Anesthesia Preprocedure Evaluation (Signed)
Anesthesia Evaluation  Patient identified by MRN, date of birth, ID band Patient awake    Reviewed: Allergy & Precautions, NPO status , Patient's Chart, lab work & pertinent test results  Airway Mallampati: I  TM Distance: >3 FB Neck ROM: Full    Dental   Pulmonary    Pulmonary exam normal        Cardiovascular hypertension, Pt. on medications Normal cardiovascular exam     Neuro/Psych Anxiety    GI/Hepatic   Endo/Other    Renal/GU      Musculoskeletal   Abdominal   Peds  Hematology   Anesthesia Other Findings   Reproductive/Obstetrics                             Anesthesia Physical Anesthesia Plan  ASA: II and emergent  Anesthesia Plan: General   Post-op Pain Management:    Induction: Intravenous, Rapid sequence and Cricoid pressure planned  PONV Risk Score and Plan: 2 and Dexamethasone, Ondansetron and Treatment may vary due to age or medical condition  Airway Management Planned: Oral ETT  Additional Equipment:   Intra-op Plan:   Post-operative Plan: Extubation in OR  Informed Consent: I have reviewed the patients History and Physical, chart, labs and discussed the procedure including the risks, benefits and alternatives for the proposed anesthesia with the patient or authorized representative who has indicated his/her understanding and acceptance.     Plan Discussed with: CRNA and Surgeon  Anesthesia Plan Comments:         Anesthesia Quick Evaluation

## 2017-10-04 NOTE — Anesthesia Procedure Notes (Signed)
Procedure Name: Intubation Date/Time: 10/04/2017 6:44 PM Performed by: Anne Fu, CRNA Pre-anesthesia Checklist: Patient identified, Emergency Drugs available, Suction available, Patient being monitored and Timeout performed Patient Re-evaluated:Patient Re-evaluated prior to induction Oxygen Delivery Method: Circle system utilized Preoxygenation: Pre-oxygenation with 100% oxygen Induction Type: IV induction, Cricoid Pressure applied and Rapid sequence Laryngoscope Size: Mac and 4 Grade View: Grade II Tube type: Oral Tube size: 7.5 mm Number of attempts: 1 Airway Equipment and Method: Stylet Placement Confirmation: ETT inserted through vocal cords under direct vision,  positive ETCO2 and breath sounds checked- equal and bilateral Secured at: 21 cm Tube secured with: Tape Dental Injury: Teeth and Oropharynx as per pre-operative assessment

## 2017-10-04 NOTE — Discharge Instructions (Signed)

## 2017-10-04 NOTE — Anesthesia Postprocedure Evaluation (Signed)
Anesthesia Post Note  Patient: Alan HavensRobert W Shepherd  Procedure(s) Performed: CYSTOSCOPY WITH LEFT  RETROGRADE PYELOGRAM/URETERAL STENT PLACEMENT (Left Ureter)     Patient location during evaluation: PACU Anesthesia Type: General Level of consciousness: awake and alert Pain management: pain level controlled Vital Signs Assessment: post-procedure vital signs reviewed and stable Respiratory status: spontaneous breathing, nonlabored ventilation, respiratory function stable and patient connected to nasal cannula oxygen Cardiovascular status: blood pressure returned to baseline and stable Postop Assessment: no apparent nausea or vomiting Anesthetic complications: no    Last Vitals:  Vitals:   10/04/17 1738  BP: (!) 211/129  Pulse: (!) 106  Resp: 18  Temp: 37.2 C  SpO2: 98%    Last Pain:  Vitals:   10/04/17 1816  TempSrc:   PainSc: 4                  Jabreel Chimento DAVID

## 2017-10-04 NOTE — H&P (Signed)
HPI: Alan Shepherd is a 56 year-old male with an obstructing left ureteral stone, colic, low-grade fever and possibly infected urine.    The problem is on the left side. He first stated  noticing pain on approximately 07/08/2017. This is not his first kidney stone. His first stone was approximately 06/19/1997. He has had 4 stones prior to getting this one. He is currently having back pain. He denies having flank pain, groin pain, nausea, vomiting, fever, and chills. He has caught a stone in his urine strainer since his symptoms began.   He has had ureteral stent and ureteroscopy for treatment of his stones in the past.   07/11/2017: 3 days ago he developed right flank pain with associated chills. He has multiple stone events with his last event in 01/2016. Renal US from today shows right hydronephrosis. He has multiple bilateral renal calculi   08/06/17: Patient returns today for follow up. He continues to have some intermittent generalized lower back pain. He also has some RLQ and groin pain. He denies any dysuria, gross hematuria, urgency, frequency, nausea, or vomiting. He denies fever.   08/27/17: Patient returns today for follow up. He did pass a stone in the interval since his most recent visit. However, he continues to complain of back pain/flank pain bilaterally today. He is very anxious regarding this. He denies any significant changes in baseline voiding habits. No gross hematuria, dysuria, or fever. Past stone composition shows mix of uric acid and calcium oxalate.   10/03/17: Most recent CT imaging showed small, non obstructing stones bilaterally. Today, he presents with acute left flank pain that began early this morning. Pain radiates to his LLQ. He also has some associated nausea. He denies any exacerbation of his voiding symptoms. He denies gross hematuria or fevers.   10/04/2017: Pain started this morning and he is taking ketoralac. He felt flushed after and felt his throat was tightening so  he stopped the medication.     ALLERGIES: Aspirin TABS    MEDICATIONS: Ketorolac Tromethamine 10 mg tablet 1 tablet PO Q 6 H PRN  Percocet 5 mg-325 mg tablet 1 tablet PO Q 6 H PRN  Tamsulosin Hcl  Co Q10  EpiPen DEVI Injection  Magnesium  Zofran Odt 8 mg tablet,disintegrating 1 tablet PO Q 8 H PRN     GU PSH: Ureteroscopic stone removal - 2008      PSH Notes: Cystoscopy With Ureteroscopy With Removal Of Calculus   NON-GU PSH: No Non-GU PSH    GU PMH: Renal calculus - 10/03/2017, - 08/27/2017, - 08/06/2017, - 07/11/2017, Nephrolithiasis, - 2017 BPH w/LUTS, Benign prostatic hypertrophy with lower urinary tract symptoms (LUTS) - 2017 Urinary Urgency, Urinary urgency - 2017 Dysuria, Dysuria - 2017 Ureteral calculus, Calculus of ureter - 2017 BPH w/o LUTS, Benign prostatic hypertrophy without lower urinary tract symptoms - 2014, Benign prostatic hypertrophy without lower urinary tract symptoms, - 2014 History of urolithiasis, Nephrolithiasis - 2014 Low back pain, Lower back pain - 2014      PMH Notes:  2007-07-16 15:26:49 - Note: Urinary Calculus    RMSF 04-2016 hx Lyme Disease   NON-GU PMH: Encounter for general adult medical examination without abnormal findings, Encounter for preventive health examination - 2017 Personal history of other diseases of the circulatory system, History of hypertension - 2017 Personal history of other diseases of the digestive system, History of hemorrhoids - 2017 Personal history of other endocrine, nutritional and metabolic disease, History of hypercholesterolemia - 2014 Vomiting, unspecified, Vomiting -  2014 Lyme disease, unspecified    FAMILY HISTORY: Death In The Family Father - Father Family Health Status Number - Runs In Family nephrolithiasis - Sister   SOCIAL HISTORY: Marital Status: Divorced Preferred Language: English; Ethnicity: Not Hispanic Or Latino; Race: White Has never drank.  Does not drink caffeine. Patient's occupation  is/was [employer].     Notes: Single, Caffeine Use, Never A Smoker, Alcohol Use, Occupation:   REVIEW OF SYSTEMS:    GU Review Male:   Patient denies frequent urination, hard to postpone urination, burning/ pain with urination, get up at night to urinate, leakage of urine, stream starts and stops, trouble starting your stream, have to strain to urinate , erection problems, and penile pain.  Gastrointestinal (Upper):   Patient reports nausea. Patient denies vomiting and indigestion/ heartburn.  Gastrointestinal (Lower):   Patient denies diarrhea and constipation.  Constitutional:   Patient denies fever, night sweats, weight loss, and fatigue.  Skin:   Patient denies skin rash/ lesion and itching.  Eyes:   Patient denies blurred vision and double vision.  Ears/ Nose/ Throat:   Patient denies sore throat and sinus problems.  Hematologic/Lymphatic:   Patient denies swollen glands and easy bruising.  Cardiovascular:   Patient reports chest pains. Patient denies leg swelling.  Respiratory:   Patient denies cough and shortness of breath.  Endocrine:   Patient denies excessive thirst.  Musculoskeletal:   Patient reports back pain. Patient denies joint pain.  Neurological:   Patient denies headaches and dizziness.  Psychologic:   Patient denies depression and anxiety.   VITAL SIGNS:    Weight 195 lb / 88.45 kg  Height 71 in / 180.34 cm  Temperature 99.2 F / 37.3 C  BMI 27.2 kg/m   MULTI-SYSTEM PHYSICAL EXAMINATION:    Constitutional: Well-nourished. No physical deformities. Normally developed. Good grooming.  Neck: Neck symmetrical, not swollen. Normal tracheal position.  Respiratory: No labored breathing, no use of accessory muscles.   Cardiovascular: Normal temperature, normal extremity pulses, no swelling, no varicosities.  Lymphatic: No enlargement of neck, axillae, groin.  Skin: No paleness, no jaundice, no cyanosis. No lesion, no ulcer, no rash.  Neurologic / Psychiatric:  Oriented to time, oriented to place, oriented to person. No depression, no anxiety, no agitation.  Gastrointestinal: No mass, no tenderness, no rigidity, non obese abdomen.  Musculoskeletal: Normal gait and station of head and neck.     PAST DATA REVIEWED:  Source Of History:  Patient   07/22/13 02/25/09 06/25/07 10/25/05  PSA  Total PSA 0.99  0.59  0.51  0.50     02/29/04  Hormones  Testosterone, Total 3.11     PROCEDURES:         KUB - 74018  A single view of the abdomen is obtained.  It is difficult to discern his stone on his KUB today.        ASSESSMENT/plan:   He has a known left ureteral stone and is now experiencing low-grade fever.  Placement of a ureteral stent in order to relieve obstruction and prevent worsening of his condition such as the development of urosepsis was discussed.  The fact that his stone would likely not be addressed at this time with the possibility of infection was also discussed.  He has had a stent in the past.  The probability of success of this procedure as well as the potential risks and complications and the alternatives have been discussed as well.  He understands and  will be taken to the operating room today for retrograde and stent placement.

## 2017-10-04 NOTE — Progress Notes (Signed)
Istat to be drawn back in OR for labs.

## 2017-10-04 NOTE — Transfer of Care (Signed)
Immediate Anesthesia Transfer of Care Note  Patient: Alan HavensRobert W Obrien  Procedure(s) Performed: CYSTOSCOPY WITH LEFT  RETROGRADE PYELOGRAM/URETERAL STENT PLACEMENT (Left Ureter)  Patient Location: PACU  Anesthesia Type:General  Level of Consciousness: awake and oriented  Airway & Oxygen Therapy: Patient Spontanous Breathing and Patient connected to face mask oxygen  Post-op Assessment: Report given to RN and Post -op Vital signs reviewed and stable  Post vital signs: Reviewed and stable  Last Vitals:  Vitals:   10/04/17 1738  BP: (!) 211/129  Pulse: (!) 106  Resp: 18  Temp: 37.2 C  SpO2: 98%    Last Pain:  Vitals:   10/04/17 1816  TempSrc:   PainSc: 4       Patients Stated Pain Goal: 3 (10/04/17 1816)  Complications: No apparent anesthesia complications

## 2017-10-05 ENCOUNTER — Other Ambulatory Visit: Payer: Self-pay

## 2017-10-05 ENCOUNTER — Encounter (HOSPITAL_COMMUNITY): Payer: Self-pay | Admitting: Urology

## 2017-10-05 DIAGNOSIS — N132 Hydronephrosis with renal and ureteral calculous obstruction: Secondary | ICD-10-CM | POA: Diagnosis not present

## 2017-10-05 LAB — HIV ANTIBODY (ROUTINE TESTING W REFLEX): HIV Screen 4th Generation wRfx: NONREACTIVE

## 2017-10-05 MED ORDER — SULFAMETHOXAZOLE-TRIMETHOPRIM 800-160 MG PO TABS: 1.0000 | ORAL_TABLET | Freq: Two times a day (BID) | ORAL | 0 refills | Status: DC

## 2017-10-05 MED ORDER — HYDROCODONE-ACETAMINOPHEN 10-325 MG PO TABS: 1.0000 | ORAL_TABLET | ORAL | 0 refills | Status: DC | PRN

## 2017-10-05 NOTE — Discharge Summary (Signed)
Physician Discharge Summary      Patient ID: Alan HavensRobert W Koran MRN: [id number] DOB/AGE: Jan 28, 1961 56 y.o.  Admit date: 10/04/2017 Discharge date: 10/05/2017  Admission Diagnoses: left ureteral stone, infection  Discharge Diagnoses:  Active Problems:   Ureteral calculus, left   Discharged Condition: good  Hospital Course: He had been having intermittent left flank pain.  When he was seen in the office he was having low-grade fever and there was concern of possible infection.  He was therefore taken to the operating room where a retrograde pyelogram revealed a proximal left ureteral stone.  The urine appeared clear and there was no evidence of changes in the bladder to suggest infection.  A double-J stent was placed.  He is tolerating the stent with some slight discomfort in the kidney with voiding and we discussed the fact that this is to be expected.  He will be discharged home on Bactrim DS.  He has not had any fever throughout his hospital stay overnight.  Significant Diagnostic Studies: Dg C-arm 1-60 Min-no Report  Result Date: 10/04/2017 Fluoroscopy was utilized by the requesting physician.  No radiographic interpretation.    Discharge Exam: Blood pressure (!) 136/91, pulse 73, temperature 98.3 F (36.8 C), temperature source Oral, resp. rate 18, height 5\' 11"  (1.803 m), weight 91.4 kg (201 lb 8 oz), SpO2 95 %. Awake, alert and in no distress. Abdomen soft and nontender. Cardiovascular regular rate and rhythm Chest normal respiratory effort  Disposition: 01-Home or Self Care  Discharge Instructions    Discharge patient   Complete by:  As directed    Discharge disposition:  01-Home or Self Care   Discharge patient date:  10/05/2017     Allergies as of 10/05/2017      Reactions   Aspirin Itching   Versed [midazolam] Other (See Comments)   Excessive memory loss      Medication List    STOP taking these medications   amLODipine 5 MG tablet Commonly known as:   NORVASC   atenolol 50 MG tablet Commonly known as:  TENORMIN     TAKE these medications   COQ10 PO Take 1 capsule daily by mouth.   HYDROcodone-acetaminophen 10-325 MG tablet Commonly known as:  NORCO Take 1-2 tablets every 4 (four) hours as needed by mouth for moderate pain. Maximum dose per 24 hours - 8 pills   ketorolac 10 MG tablet Commonly known as:  TORADOL Take 10 mg every 6 (six) hours as needed by mouth for moderate pain or severe pain.   Magnesium 100 MG Tabs Take 1 tablet daily by mouth.   ondansetron 8 MG disintegrating tablet Commonly known as:  ZOFRAN-ODT Take 8 mg every 8 (eight) hours as needed by mouth for nausea or vomiting. What changed:  Another medication with the same name was removed. Continue taking this medication, and follow the directions you see here.   oxyCODONE-acetaminophen 5-325 MG tablet Commonly known as:  PERCOCET/ROXICET Take 1 tablet every 6 (six) hours as needed by mouth for moderate pain or severe pain.   sulfamethoxazole-trimethoprim 800-160 MG tablet Commonly known as:  BACTRIM DS,SEPTRA DS Take 1 tablet 2 (two) times daily by mouth.   tamsulosin 0.4 MG Caps capsule Commonly known as:  FLOMAX TAKE 1 CAPSULE BY MOUTH EVERYDAY AT BEDTIME      Follow-up Information    Malen GauzeMcKenzie, Patrick L, MD On 10/17/2017.   Specialty:  Urology Why:  For your appiontment at 1:45 Contact information: [address] [place]  [id number] [phone]           Signed: Garnett FarmTTELIN,Sidra Oldfield C 10/05/2017, 6:59 AM

## 2017-10-05 NOTE — Care Management Note (Signed)
Case Management Note  Patient Details  Name: Alan HavensRobert W Shepherd MRN: [id number] Date of Birth: 1961-10-02  Subjective/Objective:  Left ureteral calculus, with stent placement                 Action/Plan: Discharge Planning: Chart reviewed. No NCM needs identified.    Expected Discharge Date:  10/05/17               Expected Discharge Plan:  Home/Self Care  In-House Referral:  NA  Discharge planning Services  CM Consult  Post Acute Care Choice:  NA Choice offered to:  NA  DME Arranged:  N/A DME Agency:  NA  HH Arranged:  NA HH Agency:  NA  Status of Service:  Completed, signed off  If discussed at Long Length of Stay Meetings, dates discussed:    Additional Comments:  Elliot CousinShavis, Khya Halls Ellen, RN 10/05/2017, 10:10 AM

## 2017-10-05 NOTE — Progress Notes (Signed)
Pt and Girlfriend given discharge teaching including all medications and schedule of medications. Pt and Girlfriend verbalized understanding of all discharge teaching and instructions. Discharged to home with discharge packet and prescriptions. VSS and no acute changes noted with Pt's assessment at time of discharge.

## 2017-10-23 ENCOUNTER — Ambulatory Visit (INDEPENDENT_AMBULATORY_CARE_PROVIDER_SITE_OTHER): Payer: BLUE CROSS/BLUE SHIELD | Admitting: Family Medicine

## 2017-10-23 ENCOUNTER — Encounter: Payer: Self-pay | Admitting: Family Medicine

## 2017-10-23 VITALS — BP 180/110 | HR 66 | Temp 97.5°F | Ht 71.0 in | Wt 201.4 lb

## 2017-10-23 DIAGNOSIS — I10 Essential (primary) hypertension: Secondary | ICD-10-CM

## 2017-10-23 DIAGNOSIS — D649 Anemia, unspecified: Secondary | ICD-10-CM

## 2017-10-23 DIAGNOSIS — E7800 Pure hypercholesterolemia, unspecified: Secondary | ICD-10-CM | POA: Diagnosis not present

## 2017-10-23 MED ORDER — EPINEPHRINE 0.3 MG/0.3ML IJ SOAJ
0.3000 mg | Freq: Once | INTRAMUSCULAR | 1 refills | Status: AC
Start: 2017-10-23 — End: 2017-10-23

## 2017-10-23 MED ORDER — OLMESARTAN MEDOXOMIL 40 MG PO TABS: 40.0000 mg | ORAL_TABLET | Freq: Every day | ORAL | 0 refills | Status: DC

## 2017-10-23 NOTE — Progress Notes (Signed)
Subjective:  Patient ID: Alan Shepherd, male    DOB: [birth year]  Age: 56 y.o. MRN: [id number]  CC: Establish Care (blood pressure)   HPI Alan Shepherd presents for establishment of care and evaluation of his blood pressure.  He has a history of elevated blood pressure and he feels as though some of it is due to whitecoat syndrome.  It has been treated in the past by cardiology but it looks as though he was lost to follow-up.  He was referred over here by his urologist.  He is status post recent ureteral stent placement 2 weeks ago.  Urology is reluctant to remove the stent until his blood pressure is reduced.  Patient has a long-standing history of ureteral calculi.  He also has a history of chronic anemia with a low MCV.  He tells me that he had a colonoscopy 3 years ago.  His mom had chronic anemia as well.  He has a history of elevated LDL cholesterol.  His father passed from an MI at age 56.  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  His DOT is due in January of this coming year.  He lives alone.  Recently divorced 2 years ago.  He says that his ex carries a significant psychiatric history of bipolar disease.  He has 2 grown sons one is 8337 and the other is 126.  He is active in his church.  He is active physically.  He is an ex-Marine.  He rides a [employer] motorcycle.   History Alan Shepherd has a past medical history of Complication of anesthesia, History of kidney stones, Hypertension, and Kidney stones.   He has a past surgical history that includes Kidney stone surgery and Cystoscopy w/ ureteral stent placement (Left, 10/04/2017).   His Family history is unknown by patient.He reports that  has never smoked. he has never used smokeless tobacco. He reports that he does not drink alcohol or use drugs.  Outpatient Medications Prior to Visit  Medication Sig Dispense Refill  . Coenzyme Q10 (COQ10 PO) Take 1 capsule daily by mouth.    Marland Kitchen.  HYDROcodone-acetaminophen (NORCO) 10-325 MG tablet Take 1-2 tablets every 4 (four) hours as needed by mouth for moderate pain. Maximum dose per 24 hours - 8 pills 20 tablet 0  . ketorolac (TORADOL) 10 MG tablet Take 10 mg every 6 (six) hours as needed by mouth for moderate pain or severe pain.   0  . Magnesium 100 MG TABS Take 1 tablet daily by mouth.    . ondansetron (ZOFRAN-ODT) 8 MG disintegrating tablet Take 8 mg every 8 (eight) hours as needed by mouth for nausea or vomiting.   0  . oxyCODONE-acetaminophen (PERCOCET/ROXICET) 5-325 MG tablet Take 1 tablet every 6 (six) hours as needed by mouth for moderate pain or severe pain.   0  . sulfamethoxazole-trimethoprim (BACTRIM DS,SEPTRA DS) 800-160 MG tablet Take 1 tablet 2 (two) times daily by mouth. 14 tablet 0  . tamsulosin (FLOMAX) 0.4 MG CAPS capsule TAKE 1 CAPSULE BY MOUTH EVERYDAY AT BEDTIME  0   No facility-administered medications prior to visit.     ROS Review of Systems  Constitutional: Negative.   HENT: Negative.   Eyes: Negative.   Respiratory: Negative.   Cardiovascular: Negative.   Gastrointestinal: Negative.   Endocrine: Negative for polyphagia and polyuria.  Genitourinary: Negative for difficulty urinating.  Musculoskeletal: Negative.   Skin: Negative.   Neurological:  Negative.   Hematological: Negative.   Psychiatric/Behavioral: Negative.     Objective:  BP (!) 180/110 (BP Location: Right Arm, Patient Position: Sitting, Cuff Size: Normal)   Pulse 66   Temp (!) 97.5 F (36.4 C) (Oral)   Ht 5\' 11"  (1.803 m)   Wt 201 lb 6 oz (91.3 kg)   SpO2 97%   BMI 28.09 kg/m   Physical Exam  Constitutional: He is oriented to person, place, and time. He appears well-developed and well-nourished. No distress.  HENT:  Head: Normocephalic and atraumatic.  Right Ear: External ear normal.  Left Ear: External ear normal.  Nose: Nose normal.  Mouth/Throat: Oropharynx is clear and moist. No oropharyngeal exudate.  Eyes:  Conjunctivae and EOM are normal. Pupils are equal, round, and reactive to light. Right eye exhibits no discharge. Left eye exhibits no discharge. No scleral icterus.  Neck: Neck supple. No JVD present. No tracheal deviation present. No thyromegaly present.  Cardiovascular: Normal rate, regular rhythm and normal heart sounds.  Pulmonary/Chest: Effort normal and breath sounds normal. No stridor.  Abdominal: Soft. Bowel sounds are normal.  Musculoskeletal: He exhibits no edema or tenderness.  Lymphadenopathy:    He has no cervical adenopathy.  Neurological: He is alert and oriented to person, place, and time.  Skin: Skin is warm and dry. He is not diaphoretic.  Psychiatric: He has a normal mood and affect. His behavior is normal.      Assessment & Plan:   Alan Shepherd was seen today for establish care.  Diagnoses and all orders for this visit:  Essential hypertension -     olmesartan (BENICAR) 40 MG tablet; Take 1 tablet (40 mg total) by mouth daily.  Anemia, unspecified type  Elevated LDL cholesterol level   I have discontinued Belia Hemanobert W. Shepherd's oxyCODONE-acetaminophen, ketorolac, tamsulosin, ondansetron, Magnesium, Coenzyme Q10 (COQ10 PO), HYDROcodone-acetaminophen, and sulfamethoxazole-trimethoprim. I am also having him start on olmesartan.   Patient will start Benicar today.  He will return in 2 weeks fasting to see me.  We will check his cholesterol electrolytes magnesium and blood counts.    Meds ordered this encounter  Medications  . olmesartan (BENICAR) 40 MG tablet    Sig: Take 1 tablet (40 mg total) by mouth daily.    Dispense:  30 tablet    Refill:  0     Follow-up: Return in about 2 weeks (around 11/06/2017).  Mliss SaxWilliam Alfred Kremer, MD

## 2017-10-23 NOTE — Addendum Note (Signed)
Addended by: Marcell AngerSELF, SARAH E on: 10/23/2017 03:03 PM   Modules accepted: Orders

## 2017-10-23 NOTE — Patient Instructions (Addendum)
Hypertension Hypertension, commonly called high blood pressure, is when the force of blood pumping through the arteries is too strong. The arteries are the blood vessels that carry blood from the heart throughout the body. Hypertension forces the heart to work harder to pump blood and may cause arteries to become narrow or stiff. Having untreated or uncontrolled hypertension can cause heart attacks, strokes, kidney disease, and other problems. A blood pressure reading consists of a higher number over a lower number. Ideally, your blood pressure should be below 120/80. The first ("top") number is called the systolic pressure. It is a measure of the pressure in your arteries as your heart beats. The second ("bottom") number is called the diastolic pressure. It is a measure of the pressure in your arteries as the heart relaxes. What are the causes? The cause of this condition is not known. What increases the risk? Some risk factors for high blood pressure are under your control. Others are not. Factors you can change  Smoking.  Having type 2 diabetes mellitus, high cholesterol, or both.  Not getting enough exercise or physical activity.  Being overweight.  Having too much fat, sugar, calories, or salt (sodium) in your diet.  Drinking too much alcohol. Factors that are difficult or impossible to change  Having chronic kidney disease.  Having a family history of high blood pressure.  Age. Risk increases with age.  Race. You may be at higher risk if you are African-American.  Gender. Men are at higher risk than women before age 45. After age 65, women are at higher risk than men.  Having obstructive sleep apnea.  Stress. What are the signs or symptoms? Extremely high blood pressure (hypertensive crisis) may cause:  Headache.  Anxiety.  Shortness of breath.  Nosebleed.  Nausea and vomiting.  Severe chest pain.  Jerky movements you cannot control (seizures).  How is this  diagnosed? This condition is diagnosed by measuring your blood pressure while you are seated, with your arm resting on a surface. The cuff of the blood pressure monitor will be placed directly against the skin of your upper arm at the level of your heart. It should be measured at least twice using the same arm. Certain conditions can cause a difference in blood pressure between your right and left arms. Certain factors can cause blood pressure readings to be lower or higher than normal (elevated) for a short period of time:  When your blood pressure is higher when you are in a health care provider's office than when you are at home, this is called white coat hypertension. Most people with this condition do not need medicines.  When your blood pressure is higher at home than when you are in a health care provider's office, this is called masked hypertension. Most people with this condition may need medicines to control blood pressure.  If you have a high blood pressure reading during one visit or you have normal blood pressure with other risk factors:  You may be asked to return on a different day to have your blood pressure checked again.  You may be asked to monitor your blood pressure at home for 1 week or longer.  If you are diagnosed with hypertension, you may have other blood or imaging tests to help your health care provider understand your overall risk for other conditions. How is this treated? This condition is treated by making healthy lifestyle changes, such as eating healthy foods, exercising more, and reducing your alcohol intake. Your   health care provider may prescribe medicine if lifestyle changes are not enough to get your blood pressure under control, and if:  Your systolic blood pressure is above 130.  Your diastolic blood pressure is above 80.  Your personal target blood pressure may vary depending on your medical conditions, your age, and other factors. Follow these  instructions at home: Eating and drinking  Eat a diet that is high in fiber and potassium, and low in sodium, added sugar, and fat. An example eating plan is called the DASH (Dietary Approaches to Stop Hypertension) diet. To eat this way: ? Eat plenty of fresh fruits and vegetables. Try to fill half of your plate at each meal with fruits and vegetables. ? Eat whole grains, such as whole wheat pasta, brown rice, or whole grain bread. Fill about one quarter of your plate with whole grains. ? Eat or drink low-fat dairy products, such as skim milk or low-fat yogurt. ? Avoid fatty cuts of meat, processed or cured meats, and poultry with skin. Fill about one quarter of your plate with lean proteins, such as fish, chicken without skin, beans, eggs, and tofu. ? Avoid premade and processed foods. These tend to be higher in sodium, added sugar, and fat.  Reduce your daily sodium intake. Most people with hypertension should eat less than 1,500 mg of sodium a day.  Limit alcohol intake to no more than 1 drink a day for nonpregnant women and 2 drinks a day for men. One drink equals 12 oz of beer, 5 oz of wine, or 1 oz of hard liquor. Lifestyle  Work with your health care provider to maintain a healthy body weight or to lose weight. Ask what an ideal weight is for you.  Get at least 30 minutes of exercise that causes your heart to beat faster (aerobic exercise) most days of the week. Activities may include walking, swimming, or biking.  Include exercise to strengthen your muscles (resistance exercise), such as pilates or lifting weights, as part of your weekly exercise routine. Try to do these types of exercises for 30 minutes at least 3 days a week.  Do not use any products that contain nicotine or tobacco, such as cigarettes and e-cigarettes. If you need help quitting, ask your health care provider.  Monitor your blood pressure at home as told by your health care provider.  Keep all follow-up visits as  told by your health care provider. This is important. Medicines  Take over-the-counter and prescription medicines only as told by your health care provider. Follow directions carefully. Blood pressure medicines must be taken as prescribed.  Do not skip doses of blood pressure medicine. Doing this puts you at risk for problems and can make the medicine less effective.  Ask your health care provider about side effects or reactions to medicines that you should watch for. Contact a health care provider if:  You think you are having a reaction to a medicine you are taking.  You have headaches that keep coming back (recurring).  You feel dizzy.  You have swelling in your ankles.  You have trouble with your vision. Get help right away if:  You develop a severe headache or confusion.  You have unusual weakness or numbness.  You feel faint.  You have severe pain in your chest or abdomen.  You vomit repeatedly.  You have trouble breathing. Summary  Hypertension is when the force of blood pumping through your arteries is too strong. If this condition is not   controlled, it may put you at risk for serious complications.  Your personal target blood pressure may vary depending on your medical conditions, your age, and other factors. For most people, a normal blood pressure is less than 120/80.  Hypertension is treated with lifestyle changes, medicines, or a combination of both. Lifestyle changes include weight loss, eating a healthy, low-sodium diet, exercising more, and limiting alcohol. This information is not intended to replace advice given to you by your health care provider. Make sure you discuss any questions you have with your health care provider. Document Released: 11/05/2005 Document Revised: 10/03/2016 Document Reviewed: 10/03/2016 Elsevier Interactive Patient Education  2018 [employer] Olmesartan tablets What is this medicine? OLMESARTAN (all mi SAR tan) is used to treat  high blood pressure. This medicine may be used for other purposes; ask your health care provider or pharmacist if you have questions. COMMON BRAND NAME(S): Benicar What should I tell my health care provider before I take this medicine? They need to know if you have any of these conditions: -if you are on a special diet, such as a low-salt diet -kidney or liver disease -an unusual or allergic reaction to olmesartan, other medicines, foods, dyes, or preservatives -pregnant or trying to get pregnant -breast-feeding How should I use this medicine? Take this medicine by mouth with a glass of water. Follow the directions on the prescription label. This medicine can be taken with or without food. Take your doses at regular intervals. Do not take your medicine more often than directed. Do not stop taking except on the advice of your doctor or health care professional. Talk to your pediatrician regarding the use of this medicine in children. While this drug may be prescribed for children as young as 6 years for selected conditions, precautions do apply. Overdosage: If you think you have taken too much of this medicine contact a poison control center or emergency room at once. NOTE: This medicine is only for you. Do not share this medicine with others. What if I miss a dose? If you miss a dose, take it as soon as you can. If it is almost time for your next dose, take only that dose. Do not take double or extra doses. What may interact with this medicine? -blood pressure medicines -diuretics, especially triamterene, spironolactone or amiloride -potassium salts or potassium supplements This list may not describe all possible interactions. Give your health care provider a list of all the medicines, herbs, non-prescription drugs, or dietary supplements you use. Also tell them if you smoke, drink alcohol, or use illegal drugs. Some items may interact with your medicine. What should I watch for while using  this medicine? Visit your doctor or health care professional for regular checks on your progress. Check your blood pressure as directed. Ask your doctor or health care professional what your blood pressure should be and when you should contact him or her. Call your doctor or health care professional if you notice an irregular or fast heart beat. Women should inform their doctor if they wish to become pregnant or think they might be pregnant. There is a potential for serious side effects to an unborn child, particularly in the second or third trimester. Talk to your health care professional or pharmacist for more information. You may get drowsy or dizzy. Do not drive, use machinery, or do anything that needs mental alertness until you know how this drug affects you. Do not stand or sit up quickly, especially if you are an older  patient. This reduces the risk of dizzy or fainting spells. Alcohol can make you more drowsy and dizzy. Avoid alcoholic drinks. Avoid salt substitutes unless you are told otherwise by your doctor or health care professional. Do not treat yourself for coughs, colds, or pain while you are taking this medicine without asking your doctor or health care professional for advice. Some ingredients may increase your blood pressure. What side effects may I notice from receiving this medicine? Side effects that you should report to your doctor or health care professional as soon as possible: -confusion, dizziness, light headedness or fainting spells -decreased amount of urine passed -diarrhea -difficulty breathing or swallowing, hoarseness, or tightening of the throat -fast or irregular heart beat, palpitations, or chest pain -skin rash, itching -swelling of your face, lips, tongue, hands, or feet -vomiting -weight loss Side effects that usually do not require medical attention (report to your doctor or health care professional if they continue or are bothersome): -cough -decreased  sexual function or desire -headache -nasal congestion or stuffiness -nausea -sore or cramping muscles This list may not describe all possible side effects. Call your doctor for medical advice about side effects. You may report side effects to FDA at 1-800-FDA-1088. Where should I keep my medicine? Keep out of the reach of children. Store your medicine at room temperature between 20 and 25 degrees C (68 and 77 degrees F). Throw away any unused medicine after the expiration date. NOTE: This sheet is a summary. It may not cover all possible information. If you have questions about this medicine, talk to your doctor, pharmacist, or health care provider.  2018 Elsevier/Gold Standard (2012-05-21 13:02:23)

## 2017-10-28 ENCOUNTER — Ambulatory Visit: Payer: BLUE CROSS/BLUE SHIELD | Admitting: Family Medicine

## 2017-11-06 ENCOUNTER — Encounter: Payer: Self-pay | Admitting: Family Medicine

## 2017-11-06 ENCOUNTER — Ambulatory Visit: Payer: BLUE CROSS/BLUE SHIELD | Admitting: Family Medicine

## 2017-11-06 ENCOUNTER — Ambulatory Visit (INDEPENDENT_AMBULATORY_CARE_PROVIDER_SITE_OTHER): Payer: BLUE CROSS/BLUE SHIELD | Admitting: Family Medicine

## 2017-11-06 VITALS — BP 160/110 | HR 61 | Temp 97.8°F | Ht 71.0 in | Wt 197.1 lb

## 2017-11-06 DIAGNOSIS — N201 Calculus of ureter: Secondary | ICD-10-CM

## 2017-11-06 DIAGNOSIS — F419 Anxiety disorder, unspecified: Secondary | ICD-10-CM | POA: Diagnosis not present

## 2017-11-06 DIAGNOSIS — D649 Anemia, unspecified: Secondary | ICD-10-CM

## 2017-11-06 DIAGNOSIS — E7800 Pure hypercholesterolemia, unspecified: Secondary | ICD-10-CM | POA: Diagnosis not present

## 2017-11-06 DIAGNOSIS — I10 Essential (primary) hypertension: Secondary | ICD-10-CM

## 2017-11-06 DIAGNOSIS — Z8249 Family history of ischemic heart disease and other diseases of the circulatory system: Secondary | ICD-10-CM

## 2017-11-06 DIAGNOSIS — Z0000 Encounter for general adult medical examination without abnormal findings: Secondary | ICD-10-CM | POA: Diagnosis not present

## 2017-11-06 LAB — COMPREHENSIVE METABOLIC PANEL
ALT: 17 U/L (ref 0–53)
AST: 19 U/L (ref 0–37)
Albumin: 5 g/dL (ref 3.5–5.2)
Alkaline Phosphatase: 58 U/L (ref 39–117)
BUN: 23 mg/dL (ref 6–23)
CHLORIDE: 105 meq/L (ref 96–112)
CO2: 27 mEq/L (ref 19–32)
Calcium: 9.7 mg/dL (ref 8.4–10.5)
Creatinine, Ser: 1.31 mg/dL (ref 0.40–1.50)
GFR: 60.02 mL/min (ref >60.00)
GLUCOSE: 89 mg/dL (ref 70–99)
POTASSIUM: 4.1 meq/L (ref 3.5–5.1)
SODIUM: 139 meq/L (ref 135–145)
Total Bilirubin: 0.8 mg/dL (ref 0.2–1.2)
Total Protein: 8.1 g/dL (ref 6.0–8.3)

## 2017-11-06 LAB — URINALYSIS, ROUTINE W REFLEX MICROSCOPIC
Bilirubin Urine: NEGATIVE
Ketones, ur: NEGATIVE
Leukocytes, UA: NEGATIVE
NITRITE: NEGATIVE
Specific Gravity, Urine: 1.025 (ref 1.000–1.030)
TOTAL PROTEIN, URINE-UPE24: NEGATIVE
Urine Glucose: NEGATIVE
Urobilinogen, UA: 0.2 (ref 0.0–1.0)
pH: 5 (ref 5.0–8.0)

## 2017-11-06 LAB — TSH: TSH: 2.59 u[IU]/mL (ref 0.35–4.50)

## 2017-11-06 LAB — CBC
HEMATOCRIT: 41.2 % (ref 39.0–52.0)
Hemoglobin: 13 g/dL (ref 13.0–17.0)
MCHC: 31.5 g/dL (ref 30.0–36.0)
MCV: 66.7 fl — ABNORMAL LOW (ref 78.0–100.0)
Platelets: 175 10*3/uL (ref 150.0–400.0)
RBC: 6.18 Mil/uL — ABNORMAL HIGH (ref 4.22–5.81)
RDW: 15.5 % (ref 11.5–15.5)
WBC: 5.5 10*3/uL (ref 4.0–10.5)

## 2017-11-06 LAB — MAGNESIUM: MAGNESIUM: 2.1 mg/dL (ref 1.5–2.5)

## 2017-11-06 LAB — LIPID PANEL
CHOLESTEROL: 221 mg/dL — AB (ref 0–200)
HDL: 45.1 mg/dL (ref >39.00)
LDL CALC: 147 mg/dL — AB (ref 0–99)
NONHDL: 176.26
Total CHOL/HDL Ratio: 5
Triglycerides: 145 mg/dL (ref 0.0–149.0)
VLDL: 29 mg/dL (ref 0.0–40.0)

## 2017-11-06 MED ORDER — TRIAMTERENE-HCTZ 37.5-25 MG PO CAPS: 1.0000 | ORAL_CAPSULE | Freq: Every day | ORAL | 1 refills | Status: DC

## 2017-11-06 MED ORDER — OLMESARTAN MEDOXOMIL 40 MG PO TABS: 40.0000 mg | ORAL_TABLET | Freq: Every day | ORAL | 0 refills | Status: DC

## 2017-11-06 NOTE — Progress Notes (Signed)
Subjective:  Patient ID: Alan Shepherd, male    DOB: November 22, 1960  Age: 56 y.o. MRN: [id number]  CC: Follow-up   HPI Alan Shepherd presents for follow-up of his high blood pressure.  He assures me that he has been taking his olmesartan daily.  This patient is obviously anxious and has great insight into that.  He is oriented to person place and time.  He does seem to have focus issues and troubles with tangential thought.  He does not hallucinate.  He denies depression.  He tells me that he does not want to start a drug like Prozac.  He does not smoke or drink alcohol or use illicit drugs.  He claims a long-standing history of "whitecoat hypertension".  He does admit some left anterior chest pain with certain movements through his chest.  This pain is not necessarily correlated with physical activity.  He has seen cardiology in the past but was lost to follow-up.  He is here today seeking seeking surgical clearance for a cystoscopy.  He tells me that he may have a cyst on 1 of his adrenal glands.   Outpatient Medications Prior to Visit  Medication Sig Dispense Refill  . olmesartan (BENICAR) 40 MG tablet Take 1 tablet (40 mg total) by mouth daily. 30 tablet 0   No facility-administered medications prior to visit.     ROS Review of Systems  Constitutional: Negative.   HENT: Negative.   Eyes: Negative.   Respiratory: Positive for chest tightness. Negative for shortness of breath and stridor.   Cardiovascular: Positive for chest pain. Negative for leg swelling.  Gastrointestinal: Negative.   Endocrine: Negative for cold intolerance and heat intolerance.  Genitourinary: Negative for difficulty urinating and dysuria.  Musculoskeletal: Negative.   Neurological: Positive for headaches (after a busy day. denies consistent nocturnal head aches.). Negative for weakness and numbness.  Hematological: Does not bruise/bleed easily.  Psychiatric/Behavioral: Negative for agitation, behavioral problems,  confusion, decreased concentration, dysphoric mood, hallucinations, self-injury, sleep disturbance and suicidal ideas. The patient is nervous/anxious.     Objective:  BP (!) 160/110 (BP Location: Right Arm, Patient Position: Sitting, Cuff Size: Normal)   Pulse 61   Temp 97.8 F (36.6 C) (Oral)   Ht 5\' 11"  (1.803 m)   Wt 197 lb 2 oz (89.4 kg)   SpO2 98%   BMI 27.49 kg/m   BP Readings from Last 3 Encounters:  11/06/17 (!) 160/110  10/23/17 (!) 180/110  10/05/17 (!) 136/91    Wt Readings from Last 3 Encounters:  11/06/17 197 lb 2 oz (89.4 kg)  10/23/17 201 lb 6 oz (91.3 kg)  10/04/17 201 lb 8 oz (91.4 kg)    Physical Exam  Lab Results  Component Value Date   WBC 5.8 04/04/2017   HGB 12.2 (L) 10/04/2017   HCT 36.0 (L) 10/04/2017   PLT 180 04/04/2017   GLUCOSE 92 10/04/2017   CHOL 210 (A) 04/04/2017   TRIG 186 (A) 04/04/2017   HDL 47 04/04/2017   LDLDIRECT 169.4 11/06/2013   LDLCALC 126 04/04/2017   ALT 13 (L) 07/22/2016   AST 19 07/22/2016   NA 143 10/04/2017   K 3.6 10/04/2017   CL 109 07/22/2016   CREATININE 1.22 07/22/2016   BUN 19 07/22/2016   CO2 28 07/22/2016   PSA 0.8 04/04/2017    Dg C-arm 1-60 Min-no Report  Result Date: 10/04/2017 Fluoroscopy was utilized by the requesting physician.  No radiographic interpretation.    Assessment &  Plan:   Alan Shepherd was seen todMolly Maduroay for follow-up.  Diagnoses and all orders for this visit:  Essential hypertension -     olmesartan (BENICAR) 40 MG tablet; Take 1 tablet (40 mg total) by mouth daily. -     triamterene-hydrochlorothiazide (DYAZIDE) 37.5-25 MG capsule; Take 1 each (1 capsule total) by mouth daily. -     CBC -     TSH -     Urinalysis, Routine w reflex microscopic -     Catecholamines, fractionated, plasma -     Ambulatory referral to Cardiology  Ureteral calculus, left  Anxiety  Family history of early CAD -     Lipid panel -     Ambulatory referral to Cardiology  Anemia, unspecified type -      CBC -     Comprehensive metabolic panel -     Iron, TIBC and Ferritin Panel  Elevated LDL cholesterol level -     Lipid panel -     Ambulatory referral to Cardiology  Health care maintenance -     Cancel: PSA   I am having Alan Shepherd start on triamterene-hydrochlorothiazide. I am also having him maintain his olmesartan.   Patient is here today seeking surgical clearance.  His blood pressure remains uncontrolled.  He has a history of elevated LDL cholesterol.  He has a family history of early heart disease.  He has obvious anxiety but is reluctant to accept treatment.  Asking for cardiology consult for ideas in moving forward with this patient.  Patient was not cleared for surgery today.  He will follow-up in 2 weeks.  Meds ordered this encounter  Medications  . olmesartan (BENICAR) 40 MG tablet    Sig: Take 1 tablet (40 mg total) by mouth daily.    Dispense:  30 tablet    Refill:  0  . triamterene-hydrochlorothiazide (DYAZIDE) 37.5-25 MG capsule    Sig: Take 1 each (1 capsule total) by mouth daily.    Dispense:  30 capsule    Refill:  1     Follow-up: Return in about 2 weeks (around 11/20/2017).  Mliss SaxWilliam Alfred Keirstin Musil, MD

## 2017-11-09 LAB — IRON,TIBC AND FERRITIN PANEL
%SAT: 40 % (ref 15–60)
Ferritin: 210 ng/mL (ref 20–380)
Iron: 121 ug/dL (ref 50–180)
TIBC: 305 ug/dL (ref 250–425)

## 2017-11-09 LAB — CATECHOLAMINES, FRACTIONATED, PLASMA
Catecholamines, Total: 732 pg/mL
Epinephrine: 56 pg/mL
Norepinephrine: 676 pg/mL

## 2017-11-11 ENCOUNTER — Telehealth: Payer: Self-pay | Admitting: *Deleted

## 2017-11-11 NOTE — Telephone Encounter (Signed)
   [facility] Pre-operative Risk Assessment    Request for surgical clearance:  1. What type of surgery is being performed? Cystoscopy left retrograde, left ureteroscopy, laser lithotripsy and stent exchange    2. When is this surgery scheduled? Pending clearance.  3. Are there any medications that need to be held prior to surgery and how long? Please advise regarding discontinuation of aspirin or any blood thinners prior to surgery, we request an average  of 5 days discontinuation for aspirin prior to surgery.   4. Practice name and name of physician performing surgery? Dr Nicolette Bang [facility]   5. What is your office phone and fax number? Phone- [phone] / fax- [phone]  6. Anesthesia type (None, local, MAC, general) ? Not specified   Desiree Lucy 11/11/2017, 11:42 AM  _________________________________________________________________   (provider comments below)

## 2017-11-13 NOTE — Telephone Encounter (Signed)
Primary Cardiologist: Dr. Eden EmmsNishan

## 2017-11-13 NOTE — Telephone Encounter (Signed)
   Primary Cardiologist:No primary care provider on file.  Chart reviewed as part of pre-operative protocol coverage. Because of Alan Shepherd's past medical history and time since last visit, he/she will require a follow-up visit in order to better assess preoperative cardiovascular risk.  Pre-op covering staff: - Please schedule appointment and call patient to inform them. - Please contact requesting surgeon's office via preferred method (i.e, phone, fax) to inform them of need for appointment prior to surgery.  Alan Shepherd, [place]  11/13/2017, 3:26 PM

## 2017-11-13 NOTE — Telephone Encounter (Signed)
appt 12/02/17 w Nada BoozerLaura Ingold for surgical clearance.

## 2017-11-14 ENCOUNTER — Telehealth: Payer: Self-pay | Admitting: Family Medicine

## 2017-11-14 ENCOUNTER — Ambulatory Visit: Payer: BLUE CROSS/BLUE SHIELD | Admitting: Family Medicine

## 2017-11-14 NOTE — Telephone Encounter (Signed)
Copied from CRM [id number]. Topic: Quick Communication - Appointment Cancellation >> Nov 14, 2017  9:12 AM Guinevere FerrariMorris, Enora Trillo E, NT wrote: Patient called to cancel appointment scheduled for today@ 11:00 with Dr. Doreene BurkeKremer. Pt said he just saw his cardiologist yesterday and will call back to reschedule.    Route to department's PEC pool.

## 2017-11-14 NOTE — Telephone Encounter (Signed)
Noted! Thank you

## 2017-12-02 ENCOUNTER — Other Ambulatory Visit: Payer: Self-pay

## 2017-12-02 ENCOUNTER — Encounter (HOSPITAL_BASED_OUTPATIENT_CLINIC_OR_DEPARTMENT_OTHER): Payer: Self-pay | Admitting: *Deleted

## 2017-12-02 ENCOUNTER — Other Ambulatory Visit: Payer: Self-pay | Admitting: Urology

## 2017-12-02 ENCOUNTER — Ambulatory Visit: Payer: BLUE CROSS/BLUE SHIELD | Admitting: Cardiology

## 2017-12-02 NOTE — Progress Notes (Signed)
SPOKE W/ PT VIA PHONE FOR PRE-OP INTERVIEW.  NPO AFTER MN.  NEEDS ISTAT.  CURRENT EKG IN CHART AND Epic.

## 2017-12-09 ENCOUNTER — Ambulatory Visit (HOSPITAL_BASED_OUTPATIENT_CLINIC_OR_DEPARTMENT_OTHER): Payer: BLUE CROSS/BLUE SHIELD | Admitting: Anesthesiology

## 2017-12-09 ENCOUNTER — Encounter (HOSPITAL_BASED_OUTPATIENT_CLINIC_OR_DEPARTMENT_OTHER): Payer: Self-pay

## 2017-12-09 ENCOUNTER — Ambulatory Visit (HOSPITAL_BASED_OUTPATIENT_CLINIC_OR_DEPARTMENT_OTHER)
Admission: RE | Admit: 2017-12-09 | Discharge: 2017-12-09 | Disposition: A | Discharge status: Home / Self Care | Payer: BLUE CROSS/BLUE SHIELD | Source: Ambulatory Visit | Attending: Urology | Admitting: Urology

## 2017-12-09 ENCOUNTER — Encounter (HOSPITAL_BASED_OUTPATIENT_CLINIC_OR_DEPARTMENT_OTHER)
Admission: RE | Disposition: A | Discharge status: Home / Self Care | Payer: Self-pay | Source: Ambulatory Visit | Attending: Urology

## 2017-12-09 DIAGNOSIS — N202 Calculus of kidney with calculus of ureter: Secondary | ICD-10-CM | POA: Insufficient documentation

## 2017-12-09 DIAGNOSIS — Z79899 Other long term (current) drug therapy: Secondary | ICD-10-CM | POA: Diagnosis not present

## 2017-12-09 DIAGNOSIS — I10 Essential (primary) hypertension: Secondary | ICD-10-CM | POA: Diagnosis not present

## 2017-12-09 DIAGNOSIS — Z87442 Personal history of urinary calculi: Secondary | ICD-10-CM | POA: Insufficient documentation

## 2017-12-09 HISTORY — DX: Essential (primary) hypertension: I10

## 2017-12-09 HISTORY — PX: HOLMIUM LASER APPLICATION: SHX5852

## 2017-12-09 HISTORY — DX: Personal history of other specified conditions: Z87.898

## 2017-12-09 HISTORY — DX: Calculus of kidney: N20.0

## 2017-12-09 HISTORY — DX: Other allergy status, other than to drugs and biological substances: Z91.09

## 2017-12-09 HISTORY — DX: Hematuria, unspecified: R31.9

## 2017-12-09 HISTORY — DX: Family history of ischemic heart disease and other diseases of the circulatory system: Z82.49

## 2017-12-09 HISTORY — PX: CYSTOSCOPY WITH RETROGRADE PYELOGRAM, URETEROSCOPY AND STENT PLACEMENT: SHX5789

## 2017-12-09 LAB — POCT I-STAT 4, (NA,K, GLUC, HGB,HCT)
GLUCOSE: 98 mg/dL (ref 65–99)
HEMATOCRIT: 38 % — AB (ref 39.0–52.0)
HEMOGLOBIN: 12.9 g/dL — AB (ref 13.0–17.0)
Potassium: 4.1 mmol/L (ref 3.5–5.1)
SODIUM: 144 mmol/L (ref 135–145)

## 2017-12-09 SURGERY — CYSTOURETEROSCOPY, WITH RETROGRADE PYELOGRAM AND STENT INSERTION
Anesthesia: General | Laterality: Left

## 2017-12-09 MED ORDER — DEXAMETHASONE SODIUM PHOSPHATE 10 MG/ML IJ SOLN
INTRAMUSCULAR | Status: AC
  Filled 2017-12-09: qty 1

## 2017-12-09 MED ORDER — DEXAMETHASONE SODIUM PHOSPHATE 10 MG/ML IJ SOLN
INTRAMUSCULAR | Status: DC | PRN
  Administered 2017-12-09: 10 mg via INTRAVENOUS

## 2017-12-09 MED ORDER — SULFAMETHOXAZOLE-TRIMETHOPRIM 800-160 MG PO TABS: 1.0000 | ORAL_TABLET | Freq: Two times a day (BID) | ORAL | 0 refills | Status: DC

## 2017-12-09 MED ORDER — ONDANSETRON HCL 4 MG/2ML IJ SOLN
INTRAMUSCULAR | Status: AC
  Filled 2017-12-09: qty 2

## 2017-12-09 MED ORDER — ONDANSETRON HCL 4 MG/2ML IJ SOLN
INTRAMUSCULAR | Status: DC | PRN
  Administered 2017-12-09: 4 mg via INTRAVENOUS

## 2017-12-09 MED ORDER — PHENYLEPHRINE 40 MCG/ML (10ML) SYRINGE FOR IV PUSH (FOR BLOOD PRESSURE SUPPORT)
PREFILLED_SYRINGE | INTRAVENOUS | Status: DC | PRN
  Administered 2017-12-09: 80 ug via INTRAVENOUS

## 2017-12-09 MED ORDER — CEFTRIAXONE SODIUM 2 G IJ SOLR
INTRAMUSCULAR | Status: AC
  Filled 2017-12-09: qty 2

## 2017-12-09 MED ORDER — FENTANYL CITRATE (PF) 100 MCG/2ML IJ SOLN
INTRAMUSCULAR | Status: AC
  Filled 2017-12-09: qty 2

## 2017-12-09 MED ORDER — PROPOFOL 10 MG/ML IV BOLUS
INTRAVENOUS | Status: AC
  Filled 2017-12-09: qty 40

## 2017-12-09 MED ORDER — PROPOFOL 10 MG/ML IV BOLUS
INTRAVENOUS | Status: DC | PRN
  Administered 2017-12-09: 270 mg via INTRAVENOUS

## 2017-12-09 MED ORDER — LIDOCAINE 2% (20 MG/ML) 5 ML SYRINGE
INTRAMUSCULAR | Status: AC
  Filled 2017-12-09: qty 5

## 2017-12-09 MED ORDER — SODIUM CHLORIDE 0.9 % IR SOLN
Status: DC | PRN
  Administered 2017-12-09 (×2): 3000 mL

## 2017-12-09 MED ORDER — KETOROLAC TROMETHAMINE 30 MG/ML IJ SOLN
30.0000 mg | Freq: Once | INTRAMUSCULAR | Status: DC | PRN
Start: 2017-12-09 — End: 2017-12-09
  Filled 2017-12-09: qty 1

## 2017-12-09 MED ORDER — LIDOCAINE 2% (20 MG/ML) 5 ML SYRINGE
INTRAMUSCULAR | Status: DC | PRN
  Administered 2017-12-09: 60 mg via INTRAVENOUS

## 2017-12-09 MED ORDER — DEXTROSE 5 % IV SOLN
INTRAVENOUS | Status: AC
  Filled 2017-12-09: qty 50

## 2017-12-09 MED ORDER — SODIUM CHLORIDE 0.9 % IV SOLN
INTRAVENOUS | Status: DC
  Administered 2017-12-09: 10:00:00 via INTRAVENOUS
  Filled 2017-12-09: qty 1000

## 2017-12-09 MED ORDER — FENTANYL CITRATE (PF) 100 MCG/2ML IJ SOLN
INTRAMUSCULAR | Status: DC | PRN
  Administered 2017-12-09 (×2): 50 ug via INTRAVENOUS

## 2017-12-09 MED ORDER — FENTANYL CITRATE (PF) 100 MCG/2ML IJ SOLN
25.0000 ug | INTRAMUSCULAR | Status: DC | PRN
  Filled 2017-12-09: qty 1

## 2017-12-09 MED ORDER — CEFTRIAXONE SODIUM 2 G IJ SOLR
2.0000 g | INTRAMUSCULAR | Status: AC
  Administered 2017-12-09: 2 g via INTRAVENOUS
  Filled 2017-12-09: qty 2

## 2017-12-09 MED ORDER — OXYCODONE-ACETAMINOPHEN 5-325 MG PO TABS: 1.0000 | ORAL_TABLET | ORAL | 0 refills | Status: DC | PRN

## 2017-12-09 MED ORDER — PROMETHAZINE HCL 25 MG/ML IJ SOLN
6.2500 mg | INTRAMUSCULAR | Status: DC | PRN
  Filled 2017-12-09: qty 1

## 2017-12-09 SURGICAL SUPPLY — 28 items
BAG DRAIN URO-CYSTO SKYTR STRL (DRAIN) ×2 IMPLANT
CATH INTERMIT  6FR 70CM (CATHETERS) IMPLANT
CLOTH BEACON ORANGE TIMEOUT ST (SAFETY) ×2 IMPLANT
EVACUATOR MICROVAS BLADDER (UROLOGICAL SUPPLIES) IMPLANT
EXTRACTOR STONE 1.7FRX115CM (UROLOGICAL SUPPLIES) ×4 IMPLANT
FIBER LASER FLEXIVA 1000 (UROLOGICAL SUPPLIES) IMPLANT
FIBER LASER FLEXIVA 200 (UROLOGICAL SUPPLIES) ×2 IMPLANT
FIBER LASER FLEXIVA 365 (UROLOGICAL SUPPLIES) IMPLANT
FIBER LASER FLEXIVA 550 (UROLOGICAL SUPPLIES) IMPLANT
FIBER LASER TRAC TIP (UROLOGICAL SUPPLIES) IMPLANT
GLOVE BIO SURGEON STRL SZ8 (GLOVE) ×2 IMPLANT
GOWN STRL REUS W/TWL LRG LVL3 (GOWN DISPOSABLE) ×2 IMPLANT
GOWN STRL REUS W/TWL XL LVL3 (GOWN DISPOSABLE) ×2 IMPLANT
GUIDEWIRE ANG ZIPWIRE 038X150 (WIRE) ×2 IMPLANT
GUIDEWIRE STR DUAL SENSOR (WIRE) IMPLANT
INFUSOR MANOMETER BAG 3000ML (MISCELLANEOUS) ×2 IMPLANT
IV NS 1000ML (IV SOLUTION) ×1
IV NS 1000ML BAXH (IV SOLUTION) ×1 IMPLANT
IV NS IRRIG 3000ML ARTHROMATIC (IV SOLUTION) ×2 IMPLANT
KIT RM TURNOVER CYSTO AR (KITS) ×2 IMPLANT
MANIFOLD NEPTUNE II (INSTRUMENTS) ×2 IMPLANT
NS IRRIG 500ML POUR BTL (IV SOLUTION) ×2 IMPLANT
PACK CYSTO (CUSTOM PROCEDURE TRAY) ×2 IMPLANT
SHEATH URETERAL 12FRX35CM (MISCELLANEOUS) ×2 IMPLANT
STENT URET 6FRX26 CONTOUR (STENTS) ×2 IMPLANT
SYRINGE 10CC LL (SYRINGE) ×2 IMPLANT
TUBE CONNECTING 12X1/4 (SUCTIONS) IMPLANT
TUBE FEEDING 8FR 16IN STR KANG (MISCELLANEOUS) IMPLANT

## 2017-12-09 NOTE — Anesthesia Procedure Notes (Signed)
Procedure Name: LMA Insertion Date/Time: 12/09/2017 11:10 AM Performed by: Briant Sitesenenny, Xiadani Damman T, CRNA Pre-anesthesia Checklist: Patient identified, Emergency Drugs available, Suction available and Patient being monitored Patient Re-evaluated:Patient Re-evaluated prior to induction Oxygen Delivery Method: Circle system utilized Preoxygenation: Pre-oxygenation with 100% oxygen Induction Type: IV induction Ventilation: Mask ventilation without difficulty LMA: LMA inserted LMA Size: 5.0 Number of attempts: 1 Airway Equipment and Method: Bite block Placement Confirmation: positive ETCO2 Tube secured with: Tape Dental Injury: Teeth and Oropharynx as per pre-operative assessment

## 2017-12-09 NOTE — Op Note (Signed)
.  Preoperative diagnosis: Left ureteral stone  Postoperative diagnosis: left renal calculi  Procedure: 1 cystoscopy 2.  Intraoperative fluoroscopy, under one hour, with interpretation 3.  Left ureteroscopic stone manipulation with laser lithotripsy 4.  Left 6 x 26 JJ stent exchange  Attending: Cleda MccreedyPatrick Mackenzie  Anesthesia: General  Estimated blood loss: None  Drains: Left 6 x 26 JJ ureteral stent with tether  Specimens: stone for analysis  Antibiotics: rocephin  Findings: left 6mm lower pole stone, 2-523mm mid pole calculi.  No hydronephrosis. No masses/lesions in the bladder. Ureteral orifices in normal anatomic location.  Indications: Patient is a 57 year old male with a history of left ureteral stone who underwent stent placement for sepsis.  After discussing treatment options, he decided proceed with left ureteroscopic stone manipulation.  Procedure her in detail: The patient was brought to the operating room and a brief timeout was done to ensure correct patient, correct procedure, correct site.  General anesthesia was administered patient was placed in dorsal lithotomy position.  Her genitalia was then prepped and draped in usual sterile fashion.  A rigid 22 French cystoscope was passed in the urethra and the bladder.  Bladder was inspected free masses or lesions.  the ureteral orifices were in the normal orthotopic locations. Using a grasper the ureteral stent was brought to the urethral meatus. we then placed a zip wire through the ureteral stent and advanced up to the renal pelvis.  we then removed the cystoscope and cannulated the left ureteral orifice with a semirigid ureteroscope.  No stone was found in the ureter. Once we reached the UPJ a sensor wire was advanced in to the renal pelvis. We then removed the ureteroscope and advanced am 12/14 x 38cm access sheath up to the renal pelvis. We then used the flexible ureteroscope to perform nephroscopy. We encountered the stone in the  lower pole and mid pole. Using a 200nm laser fiber the lower pole stone was fragmented.   the pieces were then removed with a Ngage basket.    once all stone fragments were removed we then removed the access sheath under direct vision and noted no injury to the ureter. We then placed a 6 x 26 double-j ureteral stent over the original zip wire.  We then removed the wire and good coil was noted in the the renal pelvis under fluoroscopy and the bladder under direct vision. the bladder was then drained and this concluded the procedure which was well tolerated by patient.  Complications: None  Condition: Stable, extubated, transferred to PACU  Plan: Patient is to be discharged home as to follow-up in one week. He is to remove his stent in 72 hours by pulling the tether

## 2017-12-09 NOTE — Anesthesia Preprocedure Evaluation (Signed)
Anesthesia Evaluation  Patient identified by MRN, date of birth, ID band Patient awake    Reviewed: Allergy & Precautions, NPO status , Patient's Chart, lab work & pertinent test results  Airway Mallampati: II  TM Distance: >3 FB Neck ROM: Full    Dental no notable dental hx.    Pulmonary neg pulmonary ROS,    Pulmonary exam normal breath sounds clear to auscultation       Cardiovascular hypertension, Pt. on medications Normal cardiovascular exam Rhythm:Regular Rate:Normal     Neuro/Psych negative neurological ROS  negative psych ROS   GI/Hepatic negative GI ROS, Neg liver ROS,   Endo/Other  negative endocrine ROS  Renal/GU negative Renal ROS  negative genitourinary   Musculoskeletal negative musculoskeletal ROS (+)   Abdominal   Peds negative pediatric ROS (+)  Hematology negative hematology ROS (+)   Anesthesia Other Findings   Reproductive/Obstetrics negative OB ROS                             Anesthesia Physical Anesthesia Plan  ASA: II  Anesthesia Plan: General   Post-op Pain Management:    Induction: Intravenous  PONV Risk Score and Plan: 2 and Ondansetron, Dexamethasone and Treatment may vary due to age or medical condition  Airway Management Planned: LMA  Additional Equipment:   Intra-op Plan:   Post-operative Plan: Extubation in OR  Informed Consent: I have reviewed the patients History and Physical, chart, labs and discussed the procedure including the risks, benefits and alternatives for the proposed anesthesia with the patient or authorized representative who has indicated his/her understanding and acceptance.   Dental advisory given  Plan Discussed with: CRNA and Surgeon  Anesthesia Plan Comments: (No versed)        Anesthesia Quick Evaluation

## 2017-12-09 NOTE — H&P (Signed)
Urology Admission H&P  Chief Complaint: left ureteral calculi  History of Present Illness: Mr Sharpley is a 57yo with a hx of nephrolithiasis who underwent left ureteral stent placement for a ureteral calculus and sepsis. He has urgency, frequency and dysuria with the stent in place. No flank pain  Past Medical History:  Diagnosis Date  . Complication of anesthesia    excessive memory loss with versed  . Environmental allergies   . Family history of premature coronary artery disease   . Hematuria   . History of exercise intolerance    11-10-2013   ETT normal   . History of kidney stones   . Hypertension    primary cardiolgoist-  dr Eden Emms (lov in epic 12-15-2015)---  11-10-2013 ETT normal   . Nephrolithiasis    per pt CT at dr Ronne Binning showed bilateral renal stones  . Renal calculus, left   . White coat syndrome with hypertension    Past Surgical History:  Procedure Laterality Date  . CYSTOSCOPY W/ URETERAL STENT PLACEMENT Left 10/04/2017   Procedure: CYSTOSCOPY WITH LEFT  RETROGRADE PYELOGRAM/URETERAL STENT PLACEMENT;  Surgeon: Ihor Gully, MD;  Location: [facility];  Service: Urology;  Laterality: Left;  . CYSTOSCOPY/RETROGRADE/URETEROSCOPY/STONE EXTRACTION WITH BASKET Bilateral left 04-06-2003;  right 10-08-2007   dr Stark Bray  [facility]    Home Medications:  Current Facility-Administered Medications  Medication Dose Route Frequency Provider Last Rate Last Dose  . 0.9 %  sodium chloride infusion   Intravenous Continuous Leilani Able, MD 50 mL/hr at 12/09/17 1004    . cefTRIAXone (ROCEPHIN) 2 g in dextrose 5 % 50 mL IVPB  2 g Intravenous 30 min Pre-Op [name], Mardene Celeste, MD       Allergies:  Allergies  Allergen Reactions  . Aspirin Itching  . Versed [Midazolam] Other (See Comments)    "Excessive memory loss for about six months"    Family History  Family history unknown: Yes   Social History:  reports that  has never smoked. he has never used smokeless tobacco. He  reports that he does not drink alcohol or use drugs.  Review of Systems  Genitourinary: Positive for dysuria, frequency, hematuria and urgency.  All other systems reviewed and are negative.   Physical Exam:  Vital signs in last 24 hours: Temp:  [97.8 F (36.6 C)] 97.8 F (36.6 C) (01/21 0911) Pulse Rate:  [78] 78 (01/21 0911) Resp:  [18] 18 (01/21 0911) BP: (146)/(96) 146/96 (01/21 0911) SpO2:  [100 %] 100 % (01/21 0911) Weight:  [91.8 kg (202 lb 6.4 oz)] 91.8 kg (202 lb 6.4 oz) (01/21 0911) Physical Exam  Constitutional: He is oriented to person, place, and time. He appears well-developed and well-nourished.  HENT:  Head: Normocephalic and atraumatic.  Eyes: EOM are normal. Pupils are equal, round, and reactive to light.  Neck: Normal range of motion. No thyromegaly present.  Cardiovascular: Normal rate and regular rhythm.  Respiratory: Effort normal. No respiratory distress.  GI: Soft. He exhibits no distension.  Musculoskeletal: Normal range of motion. He exhibits no edema.  Neurological: He is alert and oriented to person, place, and time.  Skin: Skin is warm and dry.  Psychiatric: He has a normal mood and affect. His behavior is normal. Judgment and thought content normal.    Laboratory Data:  Results for orders placed or performed during the hospital encounter of 12/09/17 (from the past 24 hour(s))  I-STAT 4, (NA,K, GLUC, HGB,HCT)     Status: Abnormal   Collection Time:  12/09/17 10:05 AM  Result Value Ref Range   Sodium 144 135 - 145 mmol/L   Potassium 4.1 3.5 - 5.1 mmol/L   Glucose, Bld 98 65 - 99 mg/dL   HCT 29.538.0 (L) 62.139.0 - 30.852.0 %   Hemoglobin 12.9 (L) 13.0 - 17.0 g/dL   No results found for this or any previous visit (from the past 240 hour(s)). Creatinine: No results for input(s): CREATININE in the last 168 hours. Baseline Creatinine: unknown  Impression/Assessment:  56yo with left ureteral stone  Plan:  The risks/benefits/alternatives to L URS was  explained to the patient and he understands and wishes to proceed with surgery  Wilkie AyePatrick [name] 12/09/2017, 11:02 AM

## 2017-12-09 NOTE — Anesthesia Postprocedure Evaluation (Signed)
Anesthesia Post Note  Patient: Alan HavensRobert W Wasco  Procedure(s) Performed: CYSTOSCOPY WITH RETROGRADE PYELOGRAM, URETEROSCOPY AND STENT EXCHANGE, BASKET STONE RETRIVAL (Left ) HOLMIUM LASER APPLICATION (Left )     Patient location during evaluation: PACU Anesthesia Type: General Level of consciousness: awake and alert Pain management: pain level controlled Vital Signs Assessment: post-procedure vital signs reviewed and stable Respiratory status: spontaneous breathing, nonlabored ventilation, respiratory function stable and patient connected to nasal cannula oxygen Cardiovascular status: blood pressure returned to baseline and stable Postop Assessment: no apparent nausea or vomiting Anesthetic complications: no    Last Vitals:  Vitals:   12/09/17 1215 12/09/17 1230  BP: (!) 128/103 140/86  Pulse: 94 78  Resp: 15 19  Temp:    SpO2: 98% 100%    Last Pain:  Vitals:   12/09/17 0911  TempSrc: Oral                 Thimothy Barretta S

## 2017-12-09 NOTE — Discharge Instructions (Signed)
Ureteral Stent Implantation, Care After Refer to this sheet in the next few weeks. These instructions provide you with information about caring for yourself after your procedure. Your health care provider may also give you more specific instructions. Your treatment has been planned according to current medical practices, but problems sometimes occur. Call your health care provider if you have any problems or questions after your procedure. What can I expect after the procedure? After the procedure, it is common to have:  Nausea.  Mild pain when you urinate. You may feel this pain in your lower back or lower abdomen. Pain should stop within a few minutes after you urinate. This may last for up to 1 week.  A small amount of blood in your urine for several days.  Follow these instructions at home:  Medicines  Take over-the-counter and prescription medicines only as told by your health care provider.  If you were prescribed an antibiotic medicine, take it as told by your health care provider. Do not stop taking the antibiotic even if you start to feel better.  Do not drive for 24 hours if you received a sedative.  Do not drive or operate heavy machinery while taking prescription pain medicines. Activity  Return to your normal activities as told by your health care provider. Ask your health care provider what activities are safe for you.  Do not lift anything that is heavier than 10 lb (4.5 kg). Follow this limit for 1 week after your procedure, or for as long as told by your health care provider. General instructions  Watch for any blood in your urine. Call your health care provider if the amount of blood in your urine increases.  If you have a catheter: ? Follow instructions from your health care provider about taking care of your catheter and collection bag. ? Do not take baths, swim, or use a hot tub until your health care provider approves.  Drink enough fluid to keep your urine  clear or pale yellow.  Keep all follow-up visits as told by your health care provider. This is important. Contact a health care provider if:  You have pain that gets worse or does not get better with medicine, especially pain when you urinate.  You have difficulty urinating.  You feel nauseous or you vomit repeatedly during a period of more than 2 days after the procedure. Get help right away if:  Your urine is dark red or has blood clots in it.  You are leaking urine (have incontinence).  The end of the stent comes out of your urethra.  You cannot urinate.  You have sudden, sharp, or severe pain in your abdomen or lower back.  You have a fever. This information is not intended to replace advice given to you by your health care provider. Make sure you discuss any questions you have with your health care provider. Document Released: 07/08/2013 Document Revised: 04/12/2016 Document Reviewed: 05/20/2015 Elsevier Interactive Patient Education  2018 [employer]  PLEASE REMOVE YOUR STENT IN 72 HOURS BY GENTLY PULLING THE TETHER     Post Anesthesia Home Care Instructions  Activity: Get plenty of rest for the remainder of the day. A responsible individual must stay with you for 24 hours following the procedure.  For the next 24 hours, DO NOT: -Drive a car -Advertising copywriterperate machinery -Drink alcoholic beverages -Take any medication unless instructed by your physician -Make any legal decisions or sign important papers.  Meals: Start with liquid foods such as gelatin  or soup. Progress to regular foods as tolerated. Avoid greasy, spicy, heavy foods. If nausea and/or vomiting occur, drink only clear liquids until the nausea and/or vomiting subsides. Call your physician if vomiting continues.  Special Instructions/Symptoms: Your throat may feel dry or sore from the anesthesia or the breathing tube placed in your throat during surgery. If this causes discomfort, gargle with warm salt water.  The discomfort should disappear within 24 hours.  If you had a scopolamine patch placed behind your ear for the management of post- operative nausea and/or vomiting:  1. The medication in the patch is effective for 72 hours, after which it should be removed.  Wrap patch in a tissue and discard in the trash. Wash hands thoroughly with soap and water. 2. You may remove the patch earlier than 72 hours if you experience unpleasant side effects which may include dry mouth, dizziness or visual disturbances. 3. Avoid touching the patch. Wash your hands with soap and water after contact with the patch.

## 2017-12-09 NOTE — Transfer of Care (Signed)
Immediate Anesthesia Transfer of Care Note  Patient: Alan HavensRobert W Shepherd  Procedure(s) Performed: CYSTOSCOPY WITH RETROGRADE PYELOGRAM, URETEROSCOPY AND STENT EXCHANGE, BASKET STONE RETRIVAL (Left ) HOLMIUM LASER APPLICATION (Left )  Patient Location: PACU  Anesthesia Type:General  Level of Consciousness: sedated and responds to stimulation  Airway & Oxygen Therapy: Patient Spontanous Breathing and Patient connected to nasal cannula oxygen  Post-op Assessment: Report given to RN  Post vital signs: Reviewed and stable  Last Vitals: 130/85, 80, 12, 97%, 97.6 Vitals:   12/09/17 0911  BP: (!) 146/96  Pulse: 78  Resp: 18  Temp: 36.6 C  SpO2: 100%    Last Pain:  Vitals:   12/09/17 0911  TempSrc: Oral      Patients Stated Pain Goal: 5 (12/09/17 0949)  Complications: No apparent anesthesia complications

## 2017-12-10 ENCOUNTER — Encounter (HOSPITAL_BASED_OUTPATIENT_CLINIC_OR_DEPARTMENT_OTHER): Payer: Self-pay | Admitting: Urology

## 2018-05-29 ENCOUNTER — Encounter: Payer: Self-pay | Admitting: Family Medicine

## 2018-12-16 ENCOUNTER — Other Ambulatory Visit: Payer: Self-pay

## 2018-12-16 ENCOUNTER — Emergency Department
Admission: EM | Admit: 2018-12-16 | Discharge: 2018-12-16 | Disposition: A | Discharge status: Home / Self Care | Payer: BLUE CROSS/BLUE SHIELD | Attending: Emergency Medicine | Admitting: Emergency Medicine

## 2018-12-16 ENCOUNTER — Encounter: Payer: Self-pay | Admitting: Emergency Medicine

## 2018-12-16 DIAGNOSIS — F419 Anxiety disorder, unspecified: Secondary | ICD-10-CM | POA: Diagnosis not present

## 2018-12-16 DIAGNOSIS — J111 Influenza due to unidentified influenza virus with other respiratory manifestations: Secondary | ICD-10-CM | POA: Diagnosis not present

## 2018-12-16 DIAGNOSIS — I10 Essential (primary) hypertension: Secondary | ICD-10-CM | POA: Diagnosis not present

## 2018-12-16 DIAGNOSIS — R195 Other fecal abnormalities: Secondary | ICD-10-CM | POA: Diagnosis present

## 2018-12-16 DIAGNOSIS — K921 Melena: Secondary | ICD-10-CM | POA: Diagnosis not present

## 2018-12-16 LAB — CBC
HCT: 41.8 % (ref 39.0–52.0)
HEMOGLOBIN: 12.9 g/dL — AB (ref 13.0–17.0)
MCH: 20.7 pg — ABNORMAL LOW (ref 26.0–34.0)
MCHC: 30.9 g/dL (ref 30.0–36.0)
MCV: 67.2 fL — AB (ref 80.0–100.0)
NRBC: 0 % (ref 0.0–0.2)
PLATELETS: 138 10*3/uL — AB (ref 150–400)
RBC: 6.22 MIL/uL — AB (ref 4.22–5.81)
RDW: 16.1 % — ABNORMAL HIGH (ref 11.5–15.5)
WBC: 5.4 10*3/uL (ref 4.0–10.5)

## 2018-12-16 LAB — C DIFFICILE QUICK SCREEN W PCR REFLEX
C DIFFICILE (CDIFF) INTERP: NOT DETECTED
C Diff antigen: NEGATIVE
C Diff toxin: NEGATIVE

## 2018-12-16 LAB — COMPREHENSIVE METABOLIC PANEL
ALBUMIN: 4.9 g/dL (ref 3.5–5.0)
ALK PHOS: 49 U/L (ref 38–126)
ALT: 29 U/L (ref 0–44)
ANION GAP: 7 (ref 5–15)
AST: 39 U/L (ref 15–41)
BUN: 18 mg/dL (ref 6–20)
CALCIUM: 9 mg/dL (ref 8.9–10.3)
CHLORIDE: 106 mmol/L (ref 98–111)
CO2: 23 mmol/L (ref 22–32)
Creatinine, Ser: 1.4 mg/dL — ABNORMAL HIGH (ref 0.61–1.24)
GFR calc non Af Amer: 55 mL/min — ABNORMAL LOW (ref >60)
Glucose, Bld: 99 mg/dL (ref 70–99)
POTASSIUM: 3.7 mmol/L (ref 3.5–5.1)
Sodium: 136 mmol/L (ref 135–145)
Total Bilirubin: 0.9 mg/dL (ref 0.3–1.2)
Total Protein: 8.2 g/dL — ABNORMAL HIGH (ref 6.5–8.1)

## 2018-12-16 NOTE — ED Triage Notes (Signed)
PT to ED from [facility] with c/o diarrhea since last night and states noticed bright red blood and mucous in his stool. pt diagnosed with flu yesterday. NAD noted, VSS

## 2018-12-16 NOTE — ED Provider Notes (Signed)
[facility] Emergency Department Provider Note  ____________________________________________  Time seen: Approximately 6:52 PM  I have reviewed the triage vital signs and the nursing notes.   HISTORY  Chief Complaint Rectal Bleeding   HPI Alan Shepherd is a 58 y.o. male who presents for evaluation of bright red blood and mucus in his stool.  Patient reports 3 days of cough and fever.  Was diagnosed with influenza A yesterday.  Today he reports having severe cramping abdominal pain at 4 AM.  Had a large bowel movement which looked brown.  Later in the morning patient had another episode of diarrhea and noted blood and mucus.  No further episodes of diarrhea since then.  No vomiting.  Patient reports that he was scared after seeing blood in his stool.  He is complaining of mild sharp RLQ pain however that is chronic for the last month.  He denies rectal pain.  He denies any prior history   Past Medical History:  Diagnosis Date  . Complication of anesthesia    excessive memory loss with versed  . Environmental allergies   . Family history of premature coronary artery disease   . Hematuria   . History of exercise intolerance    11-10-2013   ETT normal   . History of kidney stones   . Hypertension    primary cardiolgoist-  dr Eden Emms (lov in epic 12-15-2015)---  11-10-2013 ETT normal   . Nephrolithiasis    per pt CT at dr Ronne Binning showed bilateral renal stones  . Renal calculus, left   . White coat syndrome with hypertension     Patient Active Problem List   Diagnosis Date Noted  . Anemia 10/23/2017  . Elevated LDL cholesterol level 10/23/2017  . Ureteral calculus, left 10/04/2017  . HTN (hypertension) 11/06/2013  . Chest pain 11/06/2013  . Anxiety 11/06/2013  . Family history of early CAD 11/06/2013    Past Surgical History:  Procedure Laterality Date  . CYSTOSCOPY W/ URETERAL STENT PLACEMENT Left 10/04/2017   Procedure: CYSTOSCOPY WITH LEFT   RETROGRADE PYELOGRAM/URETERAL STENT PLACEMENT;  Surgeon: Ihor Gully, MD;  Location: [facility];  Service: Urology;  Laterality: Left;  . CYSTOSCOPY WITH RETROGRADE PYELOGRAM, URETEROSCOPY AND STENT PLACEMENT Left 12/09/2017   Procedure: CYSTOSCOPY WITH RETROGRADE PYELOGRAM, URETEROSCOPY AND STENT EXCHANGE, BASKET STONE RETRIVAL;  Surgeon: Malen Gauze, MD;  Location: [facility];  Service: Urology;  Laterality: Left;  . CYSTOSCOPY/RETROGRADE/URETEROSCOPY/STONE EXTRACTION WITH BASKET Bilateral left 04-06-2003;  right 10-08-2007   dr Stark Bray  [facility]  . HOLMIUM LASER APPLICATION Left 12/09/2017   Procedure: HOLMIUM LASER APPLICATION;  Surgeon: Malen Gauze, MD;  Location: [facility];  Service: Urology;  Laterality: Left;    Prior to Admission medications   Not on File    Allergies Bee venom; Fire Wellsite geologist; Aspirin; Onion; and Versed [midazolam]  Family History  Family history unknown: Yes    Social History Social History   Tobacco Use  . Smoking status: Never Smoker  . Smokeless tobacco: Never Used  Substance Use Topics  . Alcohol use: No  . Drug use: No    Review of Systems  Constitutional: Negative for fever. Eyes: Negative for visual changes. ENT: Negative for sore throat. Neck: No neck pain  Cardiovascular: Negative for chest pain. Respiratory: Negative for shortness of breath. Gastrointestinal: + RLQ abdominal pain and bloody diarrhea. vomiting  Genitourinary: Negative for dysuria. Musculoskeletal: Negative for back pain. Skin: Negative for rash. Neurological:  Negative for headaches, weakness or numbness. Psych: No SI or HI  ____________________________________________   PHYSICAL EXAM:  VITAL SIGNS: ED Triage Vitals  Enc Vitals Group     BP 12/16/18 1714 (!) 157/96     Pulse Rate 12/16/18 1714 91     Resp 12/16/18 1714 16     Temp 12/16/18 1714 98.8 F (37.1 C)     Temp Source 12/16/18 1714 Oral     SpO2 12/16/18 1714  100 %     Weight --      Height --      Head Circumference --      Peak Flow --      Pain Score 12/16/18 1717 0     Pain Loc --      Pain Edu? --      Excl. in GC? --     Constitutional: Alert and oriented. Well appearing and in no apparent distress. HEENT:      Head: Normocephalic and atraumatic.         Eyes: Conjunctivae are normal. Sclera is non-icteric.       Mouth/Throat: Mucous membranes are moist.       Neck: Supple with no signs of meningismus. Cardiovascular: Regular rate and rhythm. No murmurs, gallops, or rubs. 2+ symmetrical distal pulses are present in all extremities. No JVD. Respiratory: Normal respiratory effort. Lungs are clear to auscultation bilaterally. No wheezes, crackles, or rhonchi.  Gastrointestinal: Soft, non tender, and non distended with positive bowel sounds. No rebound or guarding. Musculoskeletal: Nontender with normal range of motion in all extremities. No edema, cyanosis, or erythema of extremities. Neurologic: Normal speech and language. Face is symmetric. Moving all extremities. No gross focal neurologic deficits are appreciated. Skin: Skin is warm, dry and intact. No rash noted. Psychiatric: Mood and affect are normal. Speech and behavior are normal.  ____________________________________________   LABS (all labs ordered are listed, but only abnormal results are displayed)  Labs Reviewed  COMPREHENSIVE METABOLIC PANEL - Abnormal; Notable for the following components:      Result Value   Creatinine, Ser 1.40 (*)    Total Protein 8.2 (*)    GFR calc non Af Amer 55 (*)    All other components within normal limits  CBC - Abnormal; Notable for the following components:   RBC 6.22 (*)    Hemoglobin 12.9 (*)    MCV 67.2 (*)    MCH 20.7 (*)    RDW 16.1 (*)    Platelets 138 (*)    All other components within normal limits  C DIFFICILE QUICK SCREEN W PCR REFLEX  POC OCCULT BLOOD, ED    ____________________________________________  EKG  none  ____________________________________________  RADIOLOGY  none ____________________________________________   PROCEDURES  Procedure(s) performed: None Procedures Critical Care performed:  None ____________________________________________   INITIAL IMPRESSION / ASSESSMENT AND PLAN / ED COURSE   58 y.o. male who presents for evaluation of bright red blood and mucus in his stool after a few episodes of diarrhea in the setting of being diagnosed with the Flu yesterday.  Patient is extremely anxious about the blood in the stool.  He keeps repeating that he was a large amount.  He has pictures on his phone and has a very minimal amount of blood in the toilet.  Was just one bowel movement which was bloody in the setting of having more of explosive diarrhea episode earlier in the day.  This is all most likely due to the flu.  His vitals are within normal limits.  He has no signs of sepsis.  Hemoglobin is stable.  He is complaining of right-sided abdominal pain, he has really no tenderness on exam and reports that this pain has been there for about a month.  He does have an enlarged lymph node on the right groin that he is worried about.  The lymph node slightly large but mobile with no significant tenderness over it.  Recommended follow-up with his primary care doctor for this finding.  Patient is also concerned about possible C. difficile.  We will send his stool for testing.    _________________________ 8:49 PM on 12/16/2018 -----------------------------------------  C. Diff negative.  Recommended close follow-up with primary care doctor.  Discussed signs and symptoms of acute blood loss anemia recommend to return if those develop or if he still having hematochezia for more than 24 hours.   As part of my medical decision making, I reviewed the following data within the electronic MEDICAL RECORD NUMBER Nursing notes reviewed and  incorporated, Labs reviewed , Old chart reviewed, Notes from prior ED visits and [place] Controlled Substance Database    Pertinent labs & imaging results that were available during my care of the patient were reviewed by me and considered in my medical decision making (see chart for details).    ____________________________________________   FINAL CLINICAL IMPRESSION(S) / ED DIAGNOSES  Final diagnoses:  Influenza  Hematochezia      NEW MEDICATIONS STARTED DURING THIS VISIT:  ED Discharge Orders    None       Note:  This document was prepared using Dragon voice recognition software and may include unintentional dictation errors.    Don PerkingVeronese, [place], MD 12/16/18 2056

## 2020-03-30 ENCOUNTER — Other Ambulatory Visit: Payer: Self-pay

## 2020-03-30 DIAGNOSIS — N200 Calculus of kidney: Secondary | ICD-10-CM

## 2020-04-27 ENCOUNTER — Emergency Department
Admission: EM | Admit: 2020-04-27 | Discharge: 2020-04-27 | Disposition: A | Discharge status: Home / Self Care | Payer: 59 | Attending: Emergency Medicine | Admitting: Emergency Medicine

## 2020-04-27 ENCOUNTER — Emergency Department: Payer: 59

## 2020-04-27 ENCOUNTER — Other Ambulatory Visit: Payer: Self-pay

## 2020-04-27 DIAGNOSIS — I10 Essential (primary) hypertension: Secondary | ICD-10-CM | POA: Diagnosis not present

## 2020-04-27 DIAGNOSIS — R0789 Other chest pain: Secondary | ICD-10-CM | POA: Diagnosis present

## 2020-04-27 DIAGNOSIS — Z20822 Contact with and (suspected) exposure to covid-19: Secondary | ICD-10-CM | POA: Diagnosis not present

## 2020-04-27 DIAGNOSIS — Z79899 Other long term (current) drug therapy: Secondary | ICD-10-CM | POA: Insufficient documentation

## 2020-04-27 DIAGNOSIS — R42 Dizziness and giddiness: Secondary | ICD-10-CM | POA: Insufficient documentation

## 2020-04-27 LAB — CBC
HCT: 40.3 % (ref 39.0–52.0)
Hemoglobin: 12.8 g/dL — ABNORMAL LOW (ref 13.0–17.0)
MCH: 20.9 pg — ABNORMAL LOW (ref 26.0–34.0)
MCHC: 31.8 g/dL (ref 30.0–36.0)
MCV: 66 fL — ABNORMAL LOW (ref 80.0–100.0)
Platelets: 182 10*3/uL (ref 150–400)
RBC: 6.11 MIL/uL — ABNORMAL HIGH (ref 4.22–5.81)
RDW: 15.8 % — ABNORMAL HIGH (ref 11.5–15.5)
WBC: 6.5 10*3/uL (ref 4.0–10.5)
nRBC: 0 % (ref 0.0–0.2)

## 2020-04-27 LAB — BASIC METABOLIC PANEL
Anion gap: 10 (ref 5–15)
BUN: 17 mg/dL (ref 6–20)
CO2: 23 mmol/L (ref 22–32)
Calcium: 9.4 mg/dL (ref 8.9–10.3)
Chloride: 106 mmol/L (ref 98–111)
Creatinine, Ser: 1.21 mg/dL (ref 0.61–1.24)
GFR calc Af Amer: >60 mL/min (ref >60)
GFR calc non Af Amer: >60 mL/min (ref >60)
Glucose, Bld: 98 mg/dL (ref 70–99)
Potassium: 3.6 mmol/L (ref 3.5–5.1)
Sodium: 139 mmol/L (ref 135–145)

## 2020-04-27 LAB — URINE DRUG SCREEN, QUALITATIVE (ARMC ONLY)
Amphetamines, Ur Screen: NOT DETECTED
Barbiturates, Ur Screen: NOT DETECTED
Benzodiazepine, Ur Scrn: NOT DETECTED
Cannabinoid 50 Ng, Ur ~~LOC~~: NOT DETECTED
Cocaine Metabolite,Ur ~~LOC~~: NOT DETECTED
MDMA (Ecstasy)Ur Screen: NOT DETECTED
Methadone Scn, Ur: NOT DETECTED
Opiate, Ur Screen: NOT DETECTED
Phencyclidine (PCP) Ur S: NOT DETECTED
Tricyclic, Ur Screen: NOT DETECTED

## 2020-04-27 LAB — HEPATIC FUNCTION PANEL
ALT: 22 U/L (ref 0–44)
AST: 22 U/L (ref 15–41)
Albumin: 4.6 g/dL (ref 3.5–5.0)
Alkaline Phosphatase: 55 U/L (ref 38–126)
Bilirubin, Direct: <0.1 mg/dL (ref 0.0–0.2)
Total Bilirubin: 1.1 mg/dL (ref 0.3–1.2)
Total Protein: 8 g/dL (ref 6.5–8.1)

## 2020-04-27 LAB — ETHANOL: Alcohol, Ethyl (B): <10 mg/dL (ref <10)

## 2020-04-27 LAB — TROPONIN I (HIGH SENSITIVITY)
Troponin I (High Sensitivity): 8 ng/L (ref <18)
Troponin I (High Sensitivity): 9 ng/L (ref <18)

## 2020-04-27 LAB — URINALYSIS, COMPLETE (UACMP) WITH MICROSCOPIC
Bacteria, UA: NONE SEEN
Bilirubin Urine: NEGATIVE
Glucose, UA: NEGATIVE mg/dL
Hgb urine dipstick: NEGATIVE
Ketones, ur: NEGATIVE mg/dL
Leukocytes,Ua: NEGATIVE
Nitrite: NEGATIVE
Protein, ur: NEGATIVE mg/dL
Specific Gravity, Urine: >1.046 — ABNORMAL HIGH (ref 1.005–1.030)
Squamous Epithelial / HPF: NONE SEEN (ref 0–5)
pH: 5 (ref 5.0–8.0)

## 2020-04-27 LAB — SARS CORONAVIRUS 2 BY RT PCR (HOSPITAL ORDER, PERFORMED IN ~~LOC~~ HOSPITAL LAB): SARS Coronavirus 2: NEGATIVE

## 2020-04-27 MED ORDER — DIPHENHYDRAMINE HCL 50 MG/ML IJ SOLN
12.5000 mg | Freq: Once | INTRAMUSCULAR | Status: AC
  Administered 2020-04-27: 12.5 mg via INTRAVENOUS
  Filled 2020-04-27: qty 1

## 2020-04-27 MED ORDER — IOHEXOL 350 MG/ML SOLN
100.0000 mL | Freq: Once | INTRAVENOUS | Status: AC | PRN
  Administered 2020-04-27: 100 mL via INTRAVENOUS

## 2020-04-27 MED ORDER — HYDROCHLOROTHIAZIDE 12.5 MG PO TABS: 12.5000 mg | ORAL_TABLET | Freq: Every day | ORAL | 0 refills | Status: DC

## 2020-04-27 MED ORDER — METOCLOPRAMIDE HCL 5 MG/ML IJ SOLN
10.0000 mg | Freq: Once | INTRAMUSCULAR | Status: AC
  Administered 2020-04-27: 10 mg via INTRAVENOUS
  Filled 2020-04-27: qty 2

## 2020-04-27 MED ORDER — IOHEXOL 350 MG/ML SOLN
75.0000 mL | Freq: Once | INTRAVENOUS | Status: AC | PRN
  Administered 2020-04-27: 75 mL via INTRAVENOUS

## 2020-04-27 NOTE — Discharge Instructions (Addendum)
Your work-up was reassuring including negative CT scans and lab markers.  No signs of a heart attack.  However you do have a lot of risk factors for heart disease and you should call cardiology tomorrow and see that you were seen in the ER need an ER follow-up in the next few days.  You also have your blood pressure rechecked.  In case it takes a while to get into cardiology I have written a prescription for some blood pressure medications to trial given you did not tolerate amlodipine and due to your history of kidney stones this should decrease risk of kidney stones.  Try this to see if it helps with blood pressure   Your CT scans are noted below and there are some incidental findings.  Most notably incidentally noted aneurysm that you will need follow-up in 1 year for repeat CT imaging.   1. 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 No dissection. Recommend semi-annual imaging followup by CTA or MRA and referral to cardiothoracic surgery if not already obtained. This recommendation follows 2010 ACCF/AHA/AATS/ACR/ASA/SCA/SCAI/SIR/STS/SVM Guidelines for the Diagnosis and Management of Patients With Thoracic Aortic Disease. Circulation. 2010; 121: e266-e36. 2. No aortic dissection. Minimal scattered atherosclerotic calcifications. 3. No acute abdominal/pelvic findings, mass lesions or lymphadenopathy. 4. Suspect fatty infiltration of the liver. 5. Simple bilateral renal cysts. 6. Aortic atherosclerosis.

## 2020-04-27 NOTE — ED Triage Notes (Signed)
Pt arrrived via [facility] from [facility] with a STEMI per [facility] but normal rhythm per [facility]. PT c/o chest pain since yesterday. Vitals wnl.

## 2020-04-27 NOTE — ED Provider Notes (Addendum)
[facility] Emergency Department Provider Note  ____________________________________________   First MD Initiated Contact with Patient 04/27/20 1613     (approximate)  I have reviewed the triage vital signs and the nursing notes.   HISTORY  Chief Complaint Chest Pain    HPI Alan Shepherd is a 59 y.o. male with kidney stones, hypertension who comes in with dizzy episode and chest pain.  Patient states that yesterday he had a dizzy episode that resolved.  Then today he had another episode where after he ate some toast he felt some chest tightness that was moderate, constant and felt really dizzy and had a tightness to the back of his head, nothing made it better, nothing made it worse.  Chest tightness is slightly improved but still having tightness to the back of his head.  States he has a history of [facility] spotted fever and Lyme disease and has had some headaches since then with some.  He also reports after he eats sugar he gets headaches.  According to urgent care there was concern that patient had a STEMI but upon review of EKG did not meet STEMI criteria.  Also EMS on their arrival did repeat EKG that continued to not meet STEMI criteria.  Patient was already given a full dose of aspirin by urgent care although patient does report having a allergy of itching to aspirin.  Patient does report having a history of migraines since his diagnosis of [place] spotted fever and Lyme disease.  He was previously followed by infectious disease.  Denies any current treatment for these.           Past Medical History:  Diagnosis Date  . Complication of anesthesia    excessive memory loss with versed  . Environmental allergies   . Family history of premature coronary artery disease   . Hematuria   . History of exercise intolerance    11-10-2013   ETT normal   . History of kidney stones   . Hypertension    primary cardiolgoist-  dr Eden Emms (lov in epic  12-15-2015)---  11-10-2013 ETT normal   . Nephrolithiasis    per pt CT at dr Ronne Binning showed bilateral renal stones  . Renal calculus, left   . White coat syndrome with hypertension     Patient Active Problem List   Diagnosis Date Noted  . Anemia 10/23/2017  . Elevated LDL cholesterol level 10/23/2017  . Ureteral calculus, left 10/04/2017  . HTN (hypertension) 11/06/2013  . Chest pain 11/06/2013  . Anxiety 11/06/2013  . Family history of early CAD 11/06/2013    Past Surgical History:  Procedure Laterality Date  . CYSTOSCOPY W/ URETERAL STENT PLACEMENT Left 10/04/2017   Procedure: CYSTOSCOPY WITH LEFT  RETROGRADE PYELOGRAM/URETERAL STENT PLACEMENT;  Surgeon: Ihor Gully, MD;  Location: [facility];  Service: Urology;  Laterality: Left;  . CYSTOSCOPY WITH RETROGRADE PYELOGRAM, URETEROSCOPY AND STENT PLACEMENT Left 12/09/2017   Procedure: CYSTOSCOPY WITH RETROGRADE PYELOGRAM, URETEROSCOPY AND STENT EXCHANGE, BASKET STONE RETRIVAL;  Surgeon: Malen Gauze, MD;  Location: [facility];  Service: Urology;  Laterality: Left;  . CYSTOSCOPY/RETROGRADE/URETEROSCOPY/STONE EXTRACTION WITH BASKET Bilateral left 04-06-2003;  right 10-08-2007   dr Stark Bray  [facility]  . HOLMIUM LASER APPLICATION Left 12/09/2017   Procedure: HOLMIUM LASER APPLICATION;  Surgeon: Malen Gauze, MD;  Location: [facility];  Service: Urology;  Laterality: Left;    Prior to Admission medications   Not on File  Allergies Bee venom, Fire ant, Aspirin, Onion, and Versed [midazolam]  Family History  Family history unknown: Yes    Social History Social History   Tobacco Use  . Smoking status: Never Smoker  . Smokeless tobacco: Never Used  Substance Use Topics  . Alcohol use: No  . Drug use: No      Review of Systems Constitutional: No fever/chills Eyes: No visual changes. ENT: No sore throat. Cardiovascular: Positive chest pain Respiratory: Denies shortness of  breath. Gastrointestinal: No abdominal pain.  No nausea, no vomiting.  No diarrhea.  No constipation. Genitourinary: Negative for dysuria. Musculoskeletal: Negative for back pain. Skin: Negative for rash. Neurological: Positive dizziness, positive headache All other ROS negative ____________________________________________   PHYSICAL EXAM:  VITAL SIGNS:Blood pressure (!) 195/89, pulse 60, resp. rate 17, height 5\' 10"  (1.778 m), weight 89.8 kg, SpO2 94 %.   Constitutional: Alert and oriented. Well appearing and in no acute distress. Eyes: Conjunctivae are normal. EOMI. Head: Atraumatic. Nose: No congestion/rhinnorhea. Mouth/Throat: Mucous membranes are moist.   Neck: No stridor. Trachea Midline. FROM Cardiovascular: Normal rate, regular rhythm. Grossly normal heart sounds.  Good peripheral circulation. Respiratory: Normal respiratory effort.  No retractions. Lungs CTAB. Gastrointestinal: Soft and nontender. No distention. No abdominal bruits.  Musculoskeletal: No lower extremity tenderness nor edema.  No joint effusions. Neurologic:  Normal speech and language. No gross focal neurologic deficits are appreciated.  Equal strength in arms and legs.  Sensation intact in arms and legs.  Finger-to-nose intact.  Able to walk without any ataxia Skin:  Skin is warm, dry and intact. No rash noted. Psychiatric: Mood and affect are normal. Speech and behavior are normal. GU: Deferred   ____________________________________________   LABS (all labs ordered are listed, but only abnormal results are displayed)  Labs Reviewed  CBC - Abnormal; Notable for the following components:      Result Value   RBC 6.11 (*)    Hemoglobin 12.8 (*)    MCV 66.0 (*)    MCH 20.9 (*)    RDW 15.8 (*)    All other components within normal limits  URINALYSIS, COMPLETE (UACMP) WITH MICROSCOPIC - Abnormal; Notable for the following components:   Color, Urine STRAW (*)    APPearance CLEAR (*)    Specific  Gravity, Urine >1.046 (*)    All other components within normal limits  BASIC METABOLIC PANEL  HEPATIC FUNCTION PANEL  ETHANOL  URINE DRUG SCREEN, QUALITATIVE ([facility] ONLY)  TROPONIN I (HIGH SENSITIVITY)  TROPONIN I (HIGH SENSITIVITY)   ____________________________________________   ED ECG REPORT I, Vanessa Witt, the attending physician, personally viewed and interpreted this ECG.  I reviewed patient's EKG from urgent care and there is 1/2 mm ST elevation in V2 without any reciprocal changes, no T wave inversions.  Intervals are normal.  This does not meet STEMI criteria.  repeat EKG here also was sinus rate of 78 without any ST elevation or T wave inversion and normal intervals  ____________________________________________  RADIOLOGY Loreto Bellow, personally viewed and evaluated these images (plain radiographs) as part of my medical decision making, as well as reviewing the written report by the radiologist.  ED MD interpretation: No pneumonia or widened mediastinum on chest x-ray  Official radiology report(s): CT Angio Head W or Wo Contrast  Result Date: 04/27/2020 CLINICAL DATA:  59 year old male with pain since yesterday. Query subarachnoid hemorrhage. EXAM: CT ANGIOGRAPHY HEAD AND NECK TECHNIQUE: Multidetector CT imaging of the head and neck was performed using  the standard protocol during bolus administration of intravenous contrast. Multiplanar CT image reconstructions and MIPs were obtained to evaluate the vascular anatomy. Carotid stenosis measurements (when applicable) are obtained utilizing NASCET criteria, using the distal internal carotid diameter as the denominator. CONTRAST:  24mL OMNIPAQUE IOHEXOL 350 MG/ML SOLN, OMNIPAQUE IOHEXOL 350 MG/ML SOLN in conjunction with contrast enhanced imaging of the chest abdomen and pelvis reported separately. COMPARISON:  Orbit radiographs 01/15/2012. FINDINGS: CT HEAD Brain: Cerebral volume is within normal limits for age. No  midline shift, ventriculomegaly, mass effect, evidence of mass lesion, intracranial hemorrhage or evidence of cortically based acute infarction. Gray-white matter differentiation is within normal limits throughout the brain. No encephalomalacia identified. Probable small developmental venous anomaly in the right cerebellum (normal variant). Calvarium and skull base: Negative. Paranasal sinuses: Visualized paranasal sinuses and mastoids are clear. Orbits: Visualized orbits and scalp soft tissues are within normal limits. CTA NECK Skeleton: Heterogeneous bone mineralization in the cervical spine although probably degenerative sclerosis. No destructive osseous lesion identified. More normal appearing upper thoracic bone mineralization. Upper chest: CT Chest, Abdomen, and Pelvis today are reported separately. Other neck: Negative. Aortic arch: 4 vessel arch configuration, the left vertebral arises directly from the arch. No arch atherosclerosis. Right carotid system: Negative aside from mild tortuosity. Left carotid system: Minimal soft plaque in the left CCA without stenosis on series 8, image 133. Negative cervical left ICA. Vertebral arteries: Left vertebral artery arises directly from the arch and is mildly dominant with no plaque or stenosis. Negative proximal right subclavian artery, non dominant right vertebral artery. CTA HEAD Posterior circulation: The right vertebral functionally terminates in PICA. Left vertebral supplies the basilar without plaque or stenosis. Normal left PICA origin. Tortuous basilar artery is patent without stenosis. Fetal type PCA origins more so the left. Patent SCA origins. Bilateral PCA branches are within normal limits. Anterior circulation: Both ICA siphons are patent without plaque or stenosis. Normal ophthalmic and posterior communicating artery origins. Patent carotid termini. Normal MCA and ACA origins. Diminutive anterior communicating artery. Bilateral ACA branches are within  normal limits. Left MCA M1 segment and bifurcation are patent without stenosis. Left MCA branches are within normal limits. Right MCA bifurcates early. Right M1 segment and bifurcation are patent without stenosis. Right MCA branches are within normal limits. Venous sinuses: Patent. Left transverse and sigmoid sinuses appear dominant. Anatomic variants: Dominant left vertebral artery arises directly from the arch. The right vertebral functionally terminates in PICA. Fetal type PCA origins. Review of the MIP images confirms the above findings IMPRESSION: 1. Normal for age CTA Head and Neck; minimal atherosclerosis with no arterial stenosis. 2. Normal for age CT appearance of the brain. No explanation for headache. 3. CTA chest, Abdomen, and Pelvis today are reported separately. Electronically Signed   By: Odessa Fleming M.D.   On: 04/27/2020 17:40   DG Chest 2 View  Result Date: 04/27/2020 CLINICAL DATA:  Chest pain since yesterday, myocardial infarction EXAM: CHEST - 2 VIEW COMPARISON:  01/09/2012 FINDINGS: The heart size and mediastinal contours are within normal limits. Both lungs are clear. The visualized skeletal structures are unremarkable. IMPRESSION: No active cardiopulmonary disease. Electronically Signed   By: Sharlet Salina M.D.   On: 04/27/2020 16:48   CT Angio Neck W and/or Wo Contrast  Result Date: 04/27/2020 CLINICAL DATA:  59 year old male with pain since yesterday. Query subarachnoid hemorrhage. EXAM: CT ANGIOGRAPHY HEAD AND NECK TECHNIQUE: Multidetector CT imaging of the head and neck was performed using the standard protocol  during bolus administration of intravenous contrast. Multiplanar CT image reconstructions and MIPs were obtained to evaluate the vascular anatomy. Carotid stenosis measurements (when applicable) are obtained utilizing NASCET criteria, using the distal internal carotid diameter as the denominator. CONTRAST:  45mL OMNIPAQUE IOHEXOL 350 MG/ML SOLN, OMNIPAQUE IOHEXOL 350 MG/ML  SOLN in conjunction with contrast enhanced imaging of the chest abdomen and pelvis reported separately. COMPARISON:  Orbit radiographs 01/15/2012. FINDINGS: CT HEAD Brain: Cerebral volume is within normal limits for age. No midline shift, ventriculomegaly, mass effect, evidence of mass lesion, intracranial hemorrhage or evidence of cortically based acute infarction. Gray-white matter differentiation is within normal limits throughout the brain. No encephalomalacia identified. Probable small developmental venous anomaly in the right cerebellum (normal variant). Calvarium and skull base: Negative. Paranasal sinuses: Visualized paranasal sinuses and mastoids are clear. Orbits: Visualized orbits and scalp soft tissues are within normal limits. CTA NECK Skeleton: Heterogeneous bone mineralization in the cervical spine although probably degenerative sclerosis. No destructive osseous lesion identified. More normal appearing upper thoracic bone mineralization. Upper chest: CT Chest, Abdomen, and Pelvis today are reported separately. Other neck: Negative. Aortic arch: 4 vessel arch configuration, the left vertebral arises directly from the arch. No arch atherosclerosis. Right carotid system: Negative aside from mild tortuosity. Left carotid system: Minimal soft plaque in the left CCA without stenosis on series 8, image 133. Negative cervical left ICA. Vertebral arteries: Left vertebral artery arises directly from the arch and is mildly dominant with no plaque or stenosis. Negative proximal right subclavian artery, non dominant right vertebral artery. CTA HEAD Posterior circulation: The right vertebral functionally terminates in PICA. Left vertebral supplies the basilar without plaque or stenosis. Normal left PICA origin. Tortuous basilar artery is patent without stenosis. Fetal type PCA origins more so the left. Patent SCA origins. Bilateral PCA branches are within normal limits. Anterior circulation: Both ICA siphons are  patent without plaque or stenosis. Normal ophthalmic and posterior communicating artery origins. Patent carotid termini. Normal MCA and ACA origins. Diminutive anterior communicating artery. Bilateral ACA branches are within normal limits. Left MCA M1 segment and bifurcation are patent without stenosis. Left MCA branches are within normal limits. Right MCA bifurcates early. Right M1 segment and bifurcation are patent without stenosis. Right MCA branches are within normal limits. Venous sinuses: Patent. Left transverse and sigmoid sinuses appear dominant. Anatomic variants: Dominant left vertebral artery arises directly from the arch. The right vertebral functionally terminates in PICA. Fetal type PCA origins. Review of the MIP images confirms the above findings IMPRESSION: 1. Normal for age CTA Head and Neck; minimal atherosclerosis with no arterial stenosis. 2. Normal for age CT appearance of the brain. No explanation for headache. 3. CTA chest, Abdomen, and Pelvis today are reported separately. Electronically Signed   By: Odessa Fleming M.D.   On: 04/27/2020 17:40   CT Angio Chest/Abd/Pel for Dissection W and/or Wo Contrast  Result Date: 04/27/2020 CLINICAL DATA:  Chest and abdominal pain. EXAM: CT ANGIOGRAPHY CHEST, ABDOMEN AND PELVIS TECHNIQUE: Non-contrast CT of the chest was initially obtained. Multidetector CT imaging through the chest, abdomen and pelvis was performed using the standard protocol during bolus administration of intravenous contrast. Multiplanar reconstructed images and MIPs were obtained and reviewed to evaluate the vascular anatomy. CONTRAST:  25mL OMNIPAQUE IOHEXOL 350 MG/ML SOLN, OMNIPAQUE IOHEXOL 350 MG/ML SOLN COMPARISON:  CT abdomen 08/27/2017 and CT of the heart from 2014 FINDINGS: CTA CHEST FINDINGS Cardiovascular: The heart is normal in size. No pericardial effusion. There is fusiform  aneurysmal dilatation of the ascending thoracic aorta with maximum measurement of 4.8 cm. This  measured approximately 4.4 cm back in 2014. No dissection. Scattered atherosclerotic calcifications and at the aortic arch involving the descending thoracic aorta. The branch vessels are patent. The left vertebral artery originates directly from the aorta. Suspect scattered coronary artery calcifications. The pulmonary arteries appear normal. Mediastinum/Nodes: No mediastinal or hilar mass or adenopathy. The esophagus is grossly normal. The thyroid gland is unremarkable. Lungs/Pleura: No acute pulmonary findings. No worrisome pulmonary lesions. A few small scattered subpleural pulmonary nodules are likely lymph nodes. No pleural effusions. No interstitial lung disease or bronchiectasis. Musculoskeletal: No chest wall mass, supraclavicular or axillary adenopathy. Review of the MIP images confirms the above findings. CTA ABDOMEN AND PELVIS FINDINGS VASCULAR Aorta: Normal caliber. No dissection. Minimal distal aortic atherosclerotic calcifications. Celiac: Normal SMA: Normal Renals: Normal IMA: Normal Inflow: Normal Veins: Grossly unremarkable. Review of the MIP images confirms the above findings. NON-VASCULAR Hepatobiliary: Suspect fatty infiltration of the liver. No hepatic lesions. The gallbladder is normal. No common bile duct dilatation. Pancreas: No mass, inflammation or ductal dilatation. Spleen: Normal size.  No focal lesions. Adrenals/Urinary Tract: The adrenal glands are normal. Simple bilateral renal cysts. No worrisome renal lesions or hydronephrosis. Both ureters are normal. Bladder is unremarkable. Stomach/Bowel: The stomach, duodenum, small bowel and colon are grossly normal without oral contrast. No inflammatory changes, mass lesions or obstructive findings. The appendix is normal. Lymphatic: No mesenteric or retroperitoneal mass or adenopathy. Reproductive: The prostate gland is mildly enlarged. There is median lobe hypertrophy impressing on the base of the bladder. The seminal vesicles appear normal.  Other: No pelvic mass or adenopathy. No free pelvic fluid collections. No inguinal mass or adenopathy. No abdominal wall hernia or subcutaneous lesions. Musculoskeletal: No significant bony findings. Review of the MIP images confirms the above findings. IMPRESSION: 1. 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 No dissection. Recommend semi-annual imaging followup by CTA or MRA and referral to cardiothoracic surgery if not already obtained. This recommendation follows 2010 ACCF/AHA/AATS/ACR/ASA/SCA/SCAI/SIR/STS/SVM Guidelines for the Diagnosis and Management of Patients With Thoracic Aortic Disease. Circulation. 2010; 121: e266-e36. 2. No aortic dissection. Minimal scattered atherosclerotic calcifications. 3. No acute abdominal/pelvic findings, mass lesions or lymphadenopathy. 4. Suspect fatty infiltration of the liver. 5. Simple bilateral renal cysts. 6. Aortic atherosclerosis. Aortic Atherosclerosis (ICD10-I70.0). Electronically Signed   By: Rudie Meyer M.D.   On: 04/27/2020 18:06    ____________________________________________   PROCEDURES  Procedure(s) performed (including Critical Care):  Procedures   ____________________________________________   INITIAL IMPRESSION / ASSESSMENT AND PLAN / ED COURSE   AUTRY PRUST was evaluated in Emergency Department on 04/27/2020 for the symptoms described in the history of present illness. He was evaluated in the context of the global COVID-19 pandemic, which necessitated consideration that the patient might be at risk for infection with the SARS-CoV-2 virus that causes COVID-19. Institutional protocols and algorithms that pertain to the evaluation of patients at risk for COVID-19 are in a state of rapid change based on information released by regulatory bodies including the CDC and federal and state organizations. These policies and algorithms were followed during the patient's care in the ED.     Most Likely DDx:  -MSK (atypical chest pain) but will get cardiac markers to evaluate for ACS given risk factors/age -Given patient's chest pain and patient was significantly hypertensive to the 220s upon arrival also consider dissection.  Will get CT dissection  to further evaluate. -Given patient's headache and dizziness will get CTA of the head and neck to make sure no evidence of aneurysm, dissection of the neck in the setting of significant hypertension. -Consider has been a complex migraine as well given patient does report headache dizziiness and will give some medications to help treat his headache.  DDx that was also considered d/t potential to cause harm, but was found less likely based on history and physical (as detailed above): -PNA (no fevers, cough but CXR to evaluate) -PNX (reassured with equal b/l breath sounds, CXR to evaluate) -Symptomatic anemia (will get H&H) -Pulmonary embolism as no sob at rest, not pleuritic in nature, no hypoxia -Pericarditis no rub on exam, EKG changes or hx to suggest dx -Tamponade (no notable SOB, tachycardic, hypotensive) -Esophageal rupture (no h/o diffuse vomitting/no crepitus)  Labs are reassuring.  No evidence of anemia.  Initial troponin was 8 but will get repeat  Tropes negative x2 and EKG is without evidence of ST elevation or ST depression or T wave inversions.  CT scan does show incidentally noted dilation of his ascending thoracic aorta.  Patient provided a copy of results to the follow-up.  Patient expressed understanding of needing follow-up.  Also discussed with patient given CT surgeon follow-up no signs of dissection.  His CT angio neck was also reassuring.  Reevaluated patient and his symptoms have since resolved.  Patient is ambulatory around the room without any dizziness.  Discussed with patient about his elevated blood pressures.  he does report a history of whitecoat syndrome.  Patient's blood pressures come down to 156/96  without any other interventions.  D/w with patient that he needs to follow-up with a primary care doctor to have this rechecked to see if he needs to be started on blood pressure medication.  He reports a history of taking amlodipine previously but not feeling like he reacted well to it. Denies having PCP.  Expressed importance of getting one.  Will give small dose HCTZ to start if having difficulty getting into doctors.   Patient expressed understanding.  His urine is without evidence of UTI.  Patient requesting Covid test which we will do.  Patient given a cardiology for follow-up given his risk factors for heart disease and this chest pain that he had earlier.  Although this time do not think he has signs of a heart attack given reassuring EKG and cardiac markers.  At this time patient denies any symptoms is ambulating in the room and feels comfortable going home with follow-up outpatient  I discussed the provisional nature of ED diagnosis, the treatment so far, the ongoing plan of care, follow up appointments and return precautions with the patient and any family or support people present. They expressed understanding and agreed with the plan, discharged home.      ____________________________________________   FINAL CLINICAL IMPRESSION(S) / ED DIAGNOSES   Final diagnoses:  Hypertension, unspecified type  Atypical chest pain  Dizziness     MEDICATIONS GIVEN DURING THIS VISIT:  Medications  diphenhydrAMINE (BENADRYL) injection 12.5 mg (12.5 mg Intravenous Given 04/27/20 1636)  metoCLOPramide (REGLAN) injection 10 mg (10 mg Intravenous Given 04/27/20 1636)  iohexol (OMNIPAQUE) 350 MG/ML injection 75 mL (75 mLs Intravenous Contrast Given 04/27/20 1701)  iohexol (OMNIPAQUE) 350 MG/ML injection 100 mL (100 mLs Intravenous Contrast Given 04/27/20 1702)     ED Discharge Orders    None       Note:  This document was prepared using Dragon voice recognition  software and may include  unintentional dictation errors.   Concha Se, MD 04/27/20 2025    Concha Se, MD 04/27/20 [phone]

## 2020-06-01 ENCOUNTER — Ambulatory Visit (INDEPENDENT_AMBULATORY_CARE_PROVIDER_SITE_OTHER): Payer: Self-pay | Admitting: Otolaryngology

## 2020-06-08 ENCOUNTER — Encounter (INDEPENDENT_AMBULATORY_CARE_PROVIDER_SITE_OTHER): Payer: Self-pay | Admitting: Otolaryngology

## 2020-06-08 ENCOUNTER — Other Ambulatory Visit: Payer: Self-pay

## 2020-06-08 ENCOUNTER — Ambulatory Visit (INDEPENDENT_AMBULATORY_CARE_PROVIDER_SITE_OTHER): Payer: 59 | Admitting: Otolaryngology

## 2020-06-08 VITALS — BP 140/100 | HR 96 | Temp 97.3°F | Ht 72.0 in | Wt 227.3 lb

## 2020-06-08 DIAGNOSIS — J312 Chronic pharyngitis: Secondary | ICD-10-CM

## 2020-06-08 DIAGNOSIS — R490 Dysphonia: Secondary | ICD-10-CM

## 2020-06-08 NOTE — Progress Notes (Signed)
CC: Chronic sore throat.    HPI:  Ricardo Andrade is a 59 y.o. male with a 3 month history of sore throat.  He notes this is worse with talking.  It seemed to begin after typical sore throat, but it persisted.  He has tried allergy medicines without relief.  He has history of Barrett's esophagitis.  He takes a regular antacid.  He was told by co-workers that he seemed to be getting hoarse during the day, but he has not noticed this.   He is changing to a job where he will not have to use his voice as much through the day.  He describes soreness in the low anterior midline neck. He denies neck tenderness, or alleviating factors except for brief improvement if drinking whiskey.  No change in swallowing or breathing difficulties.  He clears phlegm at times.    Past Medical History:   Diagnosis Date    Allergic rhinitis     Diabetes mellitus (CMS HCC)     Hearing loss     HTN     Thyroid disease          Current Outpatient Medications   Medication Sig    APPLE CIDER VINEGAR ORAL Take by mouth    ascorbic acid (VITAMIN C) 1,000 mg Oral Tablet Take 1,000 mg by mouth Once a day    benazepril (LOTENSIN) 5 mg Oral Tablet take 40 mg by mouth Once a day.      cinnamon bark (CINNAMON ORAL) Take by mouth    clotrimazole (LOTRIMIN) 1 % Apply externally Cream Apply  topically Twice daily.    doxycycline hyclate (VIBRAMYCIN) 100 mg Oral Capsule take 100 mg by mouth Twice daily.      lansoprazole (PREVACID SOLUTAB) 15 mg Oral Tablet,Rapid Dissolve, DR Take 15 mg by mouth Once a day    levothyroxine (SYNTHROID) 25 mcg Oral Tablet Take 25 mcg by mouth Every morning    MetFORMIN (GLUCOPHAGE) 1,000 mg Oral Tablet Take 1,000 mg by mouth Twice daily with food    Omega-3 Fatty Acids-Vitamin E (FISH OIL) 1,000 mg Oral Capsule take 1,000 mg by mouth Twice daily.      omega-3s/dha/epa/fish oil/D3 (VITAMIN-D + OMEGA-3 ORAL) Take by mouth    TURMERIC ORAL Take by mouth       No Known Allergies    Family Medical History:        Problem Relation (Age of Onset)    Diabetes Mother, Brother    Heart Disease Brother    Hypertension (High Blood Pressure) Father              Social History     Socioeconomic History    Marital status: Married     Spouse name: Not on file    Number of children: Not on file    Years of education: Not on file    Highest education level: Not on file   Occupational History    Not on file   Tobacco Use    Smoking status: Never Smoker    Smokeless tobacco: Never Used   Substance and Sexual Activity    Alcohol use: Yes    Drug use: Not on file    Sexual activity: Not on file   Other Topics Concern    Not on file   Social History Narrative    Not on file     Social Determinants of Health     Financial Resource Strain:     Difficulty  of Paying Living Expenses:    Food Insecurity:     Worried About Programme researcher, broadcasting/film/video in the Last Year:     Barista in the Last Year:    Transportation Needs:     Freight forwarder (Medical):     Lack of Transportation (Non-Medical):    Physical Activity:     Days of Exercise per Week:     Minutes of Exercise per Session:    Stress:     Feeling of Stress :    Intimate Partner Violence:     Fear of Current or Ex-Partner:     Emotionally Abused:     Physically Abused:     Sexually Abused:        Review of Systems - Do you have any fevers: no   Any weight change: no   Change in your vision: no    Chest Pain: no   Shortness of Breath: no   Stomach pain: no   Urinary difficulity: no   Joint Pain: no   Skin Problems: no   Weakness or Numbness: no   Easy Bruising or Bleeding: no   Excessive Thirst: no   Seasonal Allergies: no        Physical Examination:    BP (!) 140/100    Pulse 96    Temp 36.3 C (97.3 F)    Ht 1.829 m (6')    Wt 103 kg (227 lb 4.7 oz)    BMI 30.83 kg/m         General:  Age appropriate male seated and in no acute distress  Neurologic:   Cranial nerves: grossly intact.   Psychiatric:   Alert and oriented x 3.  Lungs:   No apparent stridorous  breathing. No acute distress.   Skin:    Skin warm and dry.   Cardiovascular:  Good perfusion of upper extremities. No cyanosis of the hands or fingers.   Head:   Atraumatic, normocephalic, no lesions  Eyes:   Palpebral fissures equal bilaterally, sclerae anicteric.  Right Ear:     External ear normally formed, canal patent.  The tympanic membrane is intact and there is no middle ear effusion.  Left Ear:  The same.   Nose:   No external lesions.  The nares are patent.  The septum is intact.  No polyps or purulence noted.   Nasopharynx: The eustachian tube orifices are unobstructed.  No lesions of the nasopharyngeal walls or posterior surface of the soft palate.   Oral Cavity:  No lesions of the gingiva, floor of mouth, tongue or palate.    Oropharynx:  No lesions of the tongue base, soft palate, or pharyngeal walls.  Hypopharynx: Mucous membranes moist.  No lesions of the piriforms or pharyngeal walls.  Larynx:  The airway is patent.  The vocal cords are mobile and adduct to midline bilaterally.  Type II muscle tension dysphonia, which is near complete toward the end of breath.  No lesions of the epiglottis, aryepiglottic folds, false vocal cords, interaryntenoid mucosa.  Heme/Lymph:  No cervical adenopathy.   Neck:    Supple, non-tender, no lymphadenopathy.  Trachea midline.  Thyroid without masses or nodules.     Impression:      1. Muscle tension dysphonia.  2. Sore throat.     Recommendations:   Exam findings were reviewed with the patient.  Suggested consideration of voice therapy.  He is changing to a job where he would be using  his voice less, so I recommend observation for now.  If he sees improvement with decreased voice use then no further intervention, but if no improvement or worsening then would recommend voice therapy.  He will call to advise.  Recommend trial of Alkaline water in the event he is experiencing non-acid reflux symptoms.    OTOLARYGOLOGY PROCEDURE NOTE      DATE OF SERVICE:   06/09/2020  PATIENT NAME:  Ricardo Andrade  DATE OF BIRTH:   November 03, 1961      Procedure:     Fiberoptic Trans-nasal Laryngoscopy  Operator:   Hassell Done  Anesthesia:    Topical    Findings:    Visualization of the hypopharynx/larynx with mirror not adequate for examination due to anatomy and active gag reflex and fiberoptic examination performed.  The flexible laryngoscope was passed after topical application of phenylephnine and lidocaine    Nasopharynx:  Normal mucosa and no lesions of the nasopharyngeal walls.  The Eustachian tube orifices were normal.     Hypopharynx:    No lesions of the epiglottis, posterior pharyngeal walls or piriform mucosa.  There is no pooling of secretions.    Larynx:     The airway is patent.  The vocal cords are mobile and adduct to midline bilaterally.  Type II muscle tension dysphonia, which is near complete toward the end of breath.  No lesions of the epiglottis, aryepiglottic folds, false vocal cords, interaryntenoid mucosa.    Patient tolerated the procedure well with no immediate complications.    Festus Holts, MD      Alecia Lemming, MD  [address]  [place] [address]  Alecia Lemming, MD  [address]  [place],  [place] [address]    SDR  7.22.21

## 2020-06-08 NOTE — Procedures (Signed)
See dictated note.    Festus Holts, MD  06/08/2020, 13:59

## 2020-06-09 ENCOUNTER — Encounter (INDEPENDENT_AMBULATORY_CARE_PROVIDER_SITE_OTHER): Payer: Self-pay | Admitting: Otolaryngology

## 2020-07-01 ENCOUNTER — Other Ambulatory Visit (HOSPITAL_COMMUNITY): Payer: Self-pay | Admitting: FAMILY PRACTICE

## 2020-07-01 DIAGNOSIS — R7989 Other specified abnormal findings of blood chemistry: Secondary | ICD-10-CM

## 2020-09-28 ENCOUNTER — Ambulatory Visit: Payer: BLUE CROSS/BLUE SHIELD | Admitting: Urology

## 2020-10-04 NOTE — Progress Notes (Signed)
Department of Endocrinology  History & Physical     Name/MRN: Ricardo Andrade, [id number] Date of service: 10/05/2020   Age/DOB: 59 y.o., [birth year] Chief Complaint: Hypothyroidism     HISTORY OF PRESENTING ILLNESS:     Ricardo Pingree) is a 59 y.o. male with PMH of hypothyroidism, GERD, allergic rhinitis, hearing loss, and type II DM who presents to Endocrinology Clinic as a referral from his PCP for evaluation of subclinical hypothyroidism secondary to Hashimoto's thyroiditis.    Reviewed referral notes from PCP, Dr. Fidela Salisbury: Patient had a TSH in 06/2020 which was mildly elevated to 4.86. He had positive TPO antibodies in 11/2012 and 117 in 05/2020.     Pertinent History:  -Most recent thyroid labs: 06/2020- TSH 4.86; free T4 0.85 on 05/25/2020 and TSH 5.38  -Prior known thyroid disease: Hypothyroidism diagnosed a few months ago  -History of thyroid cancer:  No  -History of radiation to the head or neck:  No  -History of thyroid surgery: No  -Taking any iodine supplementation: No  -Recent IV contrast administration (within past 6 months): No  -Recent upper respiratory or GI tract infection (within past 6 months):  No  -Family history of thyroid disease:  Mother- hypothyroidism  -Family history of thyroid cancer:  No  -Family history of autoimmune diseases: No  -Biotin or herbal supplements: No biotin; takes turmeric (joints, plays basketball), vitamin D, apple cider vinegar and cinnamon (diabetes)  -Steroid use (PO, injection, cream): No  -Taking Synthroid, Armour Thyroid, Nature Thyroid, Tirosint, any other thyroid med: Levothyroxine 25 mcg qAM-- was started a few months ago   -Takes thyroid medication on an empty stomach/first thing in the morning: Yes   -Missing thyroid medication doses: No    Symptoms:  -Fatigue/Energy level:  Yes, comes and goes  -Mood changes:  No  -Weight changes:  Stable  -Tremors:  No  -Palpitations:  No  -Skin/Hair/Nail changes:  No  -Constipation or Diarrhea: Some diarrhea  if takes metformin on an empty stomach  -Abdominal Pain:  No  -Hot/cold intolerance:  No  -Sweating: No  -Increased neck size:  No  -Shortness of breath, choking, difficulty swallowing:  No  -Anterior neck pain:  No  -Voice changes:  No  -Dry or gritty sensation in the eyes:  No  -Increased prominence of the eyes:  No       MEDICAL HISTORY:   PAST MEDICAL & SURGICAL HISTORIES:   Past Medical History:   Diagnosis Date    Allergic rhinitis     GERD (gastroesophageal reflux disease)     Hearing loss     HTN     Hypothyroidism     Type II diabetes mellitus (CMS HCC)         Past Surgical History:   Procedure Laterality Date    HX OTHER      Scrotal blood vessel removal for rupture    HX TENDON REPAIR Right     Right arm             HOME MEDICATIONS:  Current Outpatient Medications   Medication Sig    APPLE CIDER VINEGAR ORAL Take by mouth    benazepril (LOTENSIN) 5 mg Oral Tablet take 40 mg by mouth Once a day.      cholecalciferol, vitamin D3, 25 mcg (1,000 unit) Oral Tablet Take 1,000 Units by mouth Once a day    cinnamon bark (CINNAMON ORAL) Take by mouth    lansoprazole (PREVACID SOLUTAB) 15 mg  Oral Tablet,Rapid Dissolve, DR Take 15 mg by mouth Once a day    levothyroxine (SYNTHROID) 25 mcg Oral Tablet Take 25 mcg by mouth Every morning    MetFORMIN (GLUCOPHAGE) 1,000 mg Oral Tablet Take 1,000 mg by mouth Twice daily with food    TURMERIC ORAL Take by mouth         ALLERGIES:  No Known Allergies     FAMILY HISTORY:  Family Medical History:     Problem Relation (Age of Onset)    Breast Cancer Sister    Diabetes type II Mother, Brother    Heart Disease Brother    Hypertension (High Blood Pressure) Father    Hypothyroidism Mother          SOCIAL HISTORY:  Social History     Tobacco Use    Smoking status: Never Smoker    Smokeless tobacco: Former Neurosurgeon     Types: Snuff   Substance Use Topics    Alcohol use: Yes     Alcohol/week: 14.0 standard drinks     Types: 14 Cans of beer per week    Drug use:  Never   Works as a Emergency planning/management officer in [employer].  Lives in [place], [place] with his wife.     REVIEW OF SYSTEMS:     ROS: All systems reviewed and negative except for as above.    EXAMINATION:   Vitals: BP (!) 142/88    Pulse 87    Temp 36.6 C (97.8 F)    Ht 1.829 m (6')    Wt 105 kg (231 lb 0.7 oz)    SpO2 98%    BMI 31.33 kg/m    Physical Exam:  General: Well appearing male in no acute distress.  Psychiatric: Normal mood and affect  Neuro: Alert and oriented x 3. Speech fluent. Cranial nerves II-XII intact. No focal deficits noted.  HEENT: Head normocephalic, atraumatic. PERRLA, EOMI, conjunctiva clear. Wearing a face mask.   Thyroid/Neck: Trachea midline  Lungs: Normal respiratory effort.   Cardiac: Regular rate and rhythm  Abdomen: Non-distended  Extremities: No edema noted bilaterally  Skin: No visible rashes.    DATA REVIEWED:   I have reviewed previous labs, tests, imaging, and notes.    Component   Ref Range & Units 12/18/2012   THYROPEROXIDASE (TPO) ANTIBODIES, SERUM   <51 IU/mL 120     Reviewed outside labs:  -TSH 4.86 in 06/2020, 5.85 in 05/2020    ASSESSMENT & PLAN:     Orders Placed This Encounter    THYROID STIMULATING HORMONE (SENSITIVE TSH)    THYROID STIMULATING HORMONE (SENSITIVE TSH)       [name] is a 59 y.o. male with PMH of hypothyroidism, GERD, allergic rhinitis, hearing loss, and type II DM who presents to Endocrinology Clinic as a referral from his PCP for evaluation of subclinical hypothyroidism secondary to Hashimoto's thyroiditis.    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 Discussed that TPO antibodies do not need to be trended as the level does not correlate with thyroid function. Discussed that TPO antibodies themselves do not cause symptoms unless they are causing hypothyroidism  -Most recent TSH slightly elevated to 4.86. Discussed that most people  feel best with TSH on the faster side of normal (goal TSH around 1-2 rather than 3-4)  -Will repeat TSH and adjust levothyroxine as needed. Continue levothyroxine 25 mcg qAM for now  Return to clinic in 1 year    Gordy Levan, MD, MPH 10/05/2020, 09:41

## 2020-10-05 ENCOUNTER — Ambulatory Visit (HOSPITAL_BASED_OUTPATIENT_CLINIC_OR_DEPARTMENT_OTHER): Payer: 59

## 2020-10-05 ENCOUNTER — Encounter (HOSPITAL_BASED_OUTPATIENT_CLINIC_OR_DEPARTMENT_OTHER): Payer: Self-pay | Admitting: "Endocrinology

## 2020-10-05 ENCOUNTER — Ambulatory Visit: Payer: 59 | Attending: "Endocrinology | Admitting: "Endocrinology

## 2020-10-05 ENCOUNTER — Telehealth (HOSPITAL_BASED_OUTPATIENT_CLINIC_OR_DEPARTMENT_OTHER): Payer: Self-pay | Admitting: "Endocrinology

## 2020-10-05 VITALS — BP 142/88 | HR 87 | Temp 97.8°F | Ht 72.0 in | Wt 231.0 lb

## 2020-10-05 DIAGNOSIS — E038 Other specified hypothyroidism: Secondary | ICD-10-CM

## 2020-10-05 DIAGNOSIS — E063 Autoimmune thyroiditis: Secondary | ICD-10-CM

## 2020-10-05 DIAGNOSIS — R7989 Other specified abnormal findings of blood chemistry: Secondary | ICD-10-CM | POA: Insufficient documentation

## 2020-10-05 LAB — THYROID STIMULATING HORMONE (SENSITIVE TSH): TSH: 2.298 u[IU]/mL (ref 0.430–3.550)

## 2020-10-05 NOTE — Telephone Encounter (Signed)
Attempted patient contact regarding provider's below message, left message with family member to call clinic back. Sent letter to address on file.  Symphani Eckstrom, RN  10/05/2020, 14:56

## 2020-10-05 NOTE — Telephone Encounter (Signed)
-----   Message from Zadie Rhine, MD sent at 10/05/2020  2:24 PM EST -----  Nurses, please call patient and let him know that his TSH is normal but a bit on the slower side of normal. We will try to get it closer to 1s to see if that helps his symptoms. I recommend he continue taking 25 mcg daily but add an extra pill on Saturday and Sunday.    So he will take:  Monday through Friday: 25 mcg (1 pill)  Saturday and Sunday: 50 mcg (2 pills)    I recommend he get his TSH level repeated in 6-8 weeks to see if we need to adjust the dose further (I gave him a lab slip at his appointment today).    Gordy Levan, MD, MPH 10/05/2020, 14:24

## 2020-10-05 NOTE — Telephone Encounter (Signed)
Message from Hezzie Bump sent at 10/05/2020 3:11 PM EST    Summary: pt returning call    Barghouthi, Costella Hatcher, MD     Patient returning call. Please call at your convenience. Thank you!               Called, no answer. Left message. Christie Nottingham, RN  10/05/2020, 15:39

## 2020-10-06 NOTE — Telephone Encounter (Signed)
Message from [facility] sent at 10/06/2020 8:26 AM EST    Summary: ret call    Barghouthi, Dorian Furnace, MD   Pt returned the call to go over his labs- please call back                     Spoke with patient regarding provider's below message, patient verbalized understanding. Alger Kerstein, RN  10/06/2020, 08:57

## 2020-10-07 ENCOUNTER — Ambulatory Visit (HOSPITAL_BASED_OUTPATIENT_CLINIC_OR_DEPARTMENT_OTHER): Payer: Self-pay | Admitting: INTERNAL MEDICINE-ENDOCRINOLOGY-DIABETES AND METABOLISM

## 2020-10-17 ENCOUNTER — Other Ambulatory Visit: Payer: Self-pay

## 2020-10-17 ENCOUNTER — Ambulatory Visit (HOSPITAL_COMMUNITY)
Admission: RE | Admit: 2020-10-17 | Discharge: 2020-10-17 | Disposition: A | Discharge status: Home / Self Care | Payer: 59 | Source: Ambulatory Visit | Attending: Urology | Admitting: Urology

## 2020-10-17 DIAGNOSIS — N200 Calculus of kidney: Secondary | ICD-10-CM | POA: Insufficient documentation

## 2020-10-21 ENCOUNTER — Encounter: Payer: Self-pay | Admitting: Urology

## 2020-10-21 ENCOUNTER — Other Ambulatory Visit: Payer: Self-pay

## 2020-10-21 ENCOUNTER — Ambulatory Visit (INDEPENDENT_AMBULATORY_CARE_PROVIDER_SITE_OTHER): Payer: 59 | Admitting: Urology

## 2020-10-21 VITALS — Temp 98.3°F | Ht 71.0 in | Wt 195.0 lb

## 2020-10-21 DIAGNOSIS — I714 Abdominal aortic aneurysm, without rupture, unspecified: Secondary | ICD-10-CM

## 2020-10-21 DIAGNOSIS — N200 Calculus of kidney: Secondary | ICD-10-CM

## 2020-10-21 LAB — URINALYSIS, ROUTINE W REFLEX MICROSCOPIC
Bilirubin, UA: NEGATIVE
Glucose, UA: NEGATIVE
Ketones, UA: NEGATIVE
Leukocytes,UA: NEGATIVE
Nitrite, UA: NEGATIVE
Protein,UA: NEGATIVE
Specific Gravity, UA: 1.02 (ref 1.005–1.030)
Urobilinogen, Ur: 0.2 mg/dL (ref 0.2–1.0)
pH, UA: 5 (ref 5.0–7.5)

## 2020-10-21 LAB — MICROSCOPIC EXAMINATION
Bacteria, UA: NONE SEEN
Epithelial Cells (non renal): NONE SEEN /hpf (ref 0–10)
Renal Epithel, UA: NONE SEEN /hpf
WBC, UA: NONE SEEN /hpf (ref 0–5)

## 2020-10-21 NOTE — Patient Instructions (Signed)

## 2020-10-21 NOTE — Progress Notes (Signed)
Urological Symptom Review  Patient is experiencing the following symptoms: Kidney stones (passed yesterday)   Review of Systems  Gastrointestinal (upper)  : Negative for upper GI symptoms  Gastrointestinal (lower) : Negative for lower GI symptoms  Constitutional : Negative for symptoms  Skin: Negative for skin symptoms  Eyes: Negative for eye symptoms  Ear/Nose/Throat : Negative for Ear/Nose/Throat symptoms  Hematologic/Lymphatic: Negative for Hematologic/Lymphatic symptoms  Cardiovascular : Negative for cardiovascular symptoms  Respiratory : Negative for respiratory symptoms  Endocrine: Negative for endocrine symptoms  Musculoskeletal: Negative for musculoskeletal symptoms  Neurological: Negative for neurological symptoms  Psychologic: Negative for psychiatric symptoms

## 2020-10-21 NOTE — Addendum Note (Signed)
Addended by: Dalia Heading on: 10/21/2020 01:17 PM   Modules accepted: Orders

## 2020-10-21 NOTE — Progress Notes (Signed)
10/21/2020 12:17 PM   Lora Havensobert W Wollschlager Mar 01, 1961 [id number]  Referring provider: No referring provider defined for this encounter.  Nephrolithiasis  HPI: Mr Josetta HuddleGrimm is a 59yo here for followup for nephrolithiasis. He had a renal US on 11/29 which showed a 5890m left renal calculus. He developed left flank pain yesterday and passed a 7-538mm calculus yesterday. No flank pain. NO significant LUTS.   His records from [facility] are as follows: I have kidney stones.  HPI: Niel HummerRobert Mannor is a 59 year-old male established patient who is here for renal calculi.  He first stated noticing pain on approximately 07/08/2017. This is not his first kidney stone. His first stone was approximately 06/19/1997. He has had 4 stones prior to getting this one. He is not currently having flank pain, back pain, groin pain, nausea, vomiting, fever or chills.   He has had ureteral stent and ureteroscopy for treatment of his stones in the past.   07/11/2017: 3 days ago he developed right flank pain with associated chills. He has multiple stone events with his last event in 01/2016. Renal US from today shows right hydronephrosis. He has multiple bilateral renal calculi   08/06/17: Patient returns today for follow up. He continues to have some intermittent generalized lower back pain. He also has some RLQ and groin pain. He denies any dysuria, gross hematuria, urgency, frequency, nausea, or vomiting. He denies fever.   08/27/17: Patient returns today for follow up. He did pass a stone in the interval since his most recent visit. However, he continues to complain of back pain/flank pain bilaterally today. He is very anxious regarding this. He denies any significant changes in baseline voiding habits. No gross hematuria, dysuria, or fever. Past stone composition shows mix of uric acid and calcium oxalate.   10/03/17: Most recent CT imaging showed small, non obstructing stones bilaterally. Today, he presents with acute left flank pain that  began early this morning. Pain radiates to his LLQ. He also has some associated nausea. He denies any exacerbation of his voiding symptoms. He denies gross hematuria or fevers.   10/04/2017: Pain started this morning and he is taking ketoralac. He felt flushed after and felt his throat was tightening so he stopped the medication. He feels flushed and checked his temperature which was 99.5 this morning. ((.2 in our office. UA and micro show bacteria   10/17/2017: S/p L stent placement. Pt is very anxious about a repeat procedure.   12/16/2017: s/p L URS for multiple lower pole and mid pole calculi. He removed his stent 3 days ago without incident. NO flank pain. No LUTS   01/22/18: He returns today with complaints of general malaise, flushing sensation, and chills. He states that this has been ongoing for the past several days. He is concerned given his history. He denies any unilateral flank pain or abdominal pain today. He denies exacerbation of lower urinary tract symptoms. He denies gross hematuria, dysuria, nausea, vomiting, or fever. He does have generalized lower back pain today, which is chronic for him.   03/24/2020: Since Saturday he has had left flank pain. He passed 2 stones in March 2021. NO worsening LUTS. no hematuria     PMH: Past Medical History:  Diagnosis Date  . Complication of anesthesia    excessive memory loss with versed  . Environmental allergies   . Family history of premature coronary artery disease   . Hematuria   . History of exercise intolerance    11-10-2013  ETT normal   . History of kidney stones   . Hypertension    primary cardiolgoist-  dr Eden Emms (lov in epic 12-15-2015)---  11-10-2013 ETT normal   . Nephrolithiasis    per pt CT at dr Ronne Binning showed bilateral renal stones  . Renal calculus, left   . White coat syndrome with hypertension     Surgical History: Past Surgical History:  Procedure Laterality Date  . CYSTOSCOPY W/ URETERAL STENT PLACEMENT  Left 10/04/2017   Procedure: CYSTOSCOPY WITH LEFT  RETROGRADE PYELOGRAM/URETERAL STENT PLACEMENT;  Surgeon: Ihor Gully, MD;  Location: [facility];  Service: Urology;  Laterality: Left;  . CYSTOSCOPY WITH RETROGRADE PYELOGRAM, URETEROSCOPY AND STENT PLACEMENT Left 12/09/2017   Procedure: CYSTOSCOPY WITH RETROGRADE PYELOGRAM, URETEROSCOPY AND STENT EXCHANGE, BASKET STONE RETRIVAL;  Surgeon: Malen Gauze, MD;  Location: [facility];  Service: Urology;  Laterality: Left;  . CYSTOSCOPY/RETROGRADE/URETEROSCOPY/STONE EXTRACTION WITH BASKET Bilateral left 04-06-2003;  right 10-08-2007   dr Stark Bray  [facility]  . HOLMIUM LASER APPLICATION Left 12/09/2017   Procedure: HOLMIUM LASER APPLICATION;  Surgeon: Malen Gauze, MD;  Location: [facility];  Service: Urology;  Laterality: Left;    Home Medications:  Allergies as of 10/21/2020      Reactions   Bee Venom Anaphylaxis   Fire Ant Anaphylaxis   Aspirin Itching   Onion Itching   Versed [midazolam] Other (See Comments)   "Excessive memory loss for about six months"      Medication List       Accurate as of October 21, 2020 12:17 PM. If you have any questions, ask your nurse or doctor.        STOP taking these medications   hydrochlorothiazide 12.5 MG tablet Commonly known as: HYDRODIURIL Stopped by: Wilkie Aye, MD       Allergies:  Allergies  Allergen Reactions  . Bee Venom Anaphylaxis  . Fire [employer] Anaphylaxis  . Aspirin Itching  . Onion Itching  . Versed [Midazolam] Other (See Comments)    "Excessive memory loss for about six months"    Family History: Family History  Family history unknown: Yes    Social History:  reports that he has never smoked. He has never used smokeless tobacco. He reports that he does not drink alcohol and does not use drugs.  ROS: All other review of systems were reviewed and are negative except what is noted above in HPI  Physical Exam: Temp 98.3 F  (36.8 C)   Ht 5\' 11"  (1.803 m)   Wt 195 lb (88.5 kg)   BMI 27.20 kg/m   Constitutional:  Alert and oriented, No acute distress. HEENT: [place] AT, moist mucus membranes.  Trachea midline, no masses. Cardiovascular: No clubbing, cyanosis, or edema. Respiratory: Normal respiratory effort, no increased work of breathing. GI: Abdomen is soft, nontender, nondistended, no abdominal masses GU: No CVA tenderness.  Lymph: No cervical or inguinal lymphadenopathy. Skin: No rashes, bruises or suspicious lesions. Neurologic: Grossly intact, no focal deficits, moving all 4 extremities. Psychiatric: Normal mood and affect.  Laboratory Data: Lab Results  Component Value Date   WBC 6.5 04/27/2020   HGB 12.8 (L) 04/27/2020   HCT 40.3 04/27/2020   MCV 66.0 (L) 04/27/2020   PLT 182 04/27/2020    Lab Results  Component Value Date   CREATININE 1.21 04/27/2020    Lab Results  Component Value Date   PSA 0.8 04/04/2017    No results found for: TESTOSTERONE  No results found  for: HGBA1C  Urinalysis    Component Value Date/Time   COLORURINE STRAW (A) 04/27/2020 1835   APPEARANCEUR CLEAR (A) 04/27/2020 1835   LABSPEC >1.046 (H) 04/27/2020 1835   PHURINE 5.0 04/27/2020 1835   GLUCOSEU NEGATIVE 04/27/2020 1835   GLUCOSEU NEGATIVE 11/06/2017 1151   HGBUR NEGATIVE 04/27/2020 1835   BILIRUBINUR NEGATIVE 04/27/2020 1835   BILIRUBINUR neg 06/28/2012 1802   KETONESUR NEGATIVE 04/27/2020 1835   PROTEINUR NEGATIVE 04/27/2020 1835   UROBILINOGEN 0.2 11/06/2017 1151   NITRITE NEGATIVE 04/27/2020 1835   LEUKOCYTESUR NEGATIVE 04/27/2020 1835    Lab Results  Component Value Date   MUCUS pos 06/28/2012   BACTERIA NONE SEEN 04/27/2020    Pertinent Imaging: Renal US 11/29: Images reviewed and discussed with the patient  Results for orders placed in visit on 01/05/03  DG Abd 1 View  Narrative FINDINGS CLINICAL DATA:  HX URINARY TRACT CALCULI, NOW W/LUQ & LT. FLANK PAIN. ABDOMEN - AP VIEW NO  COMPARISONS.  NO INTRARENAL CALCULI ARE IDENTIFIED.  THERE IS A TINY CALCIFICATION IN THE LEFT SIDE OF THE PELVIS WHICH COULD REPRESENT A SMALL UPJ CALCULUS.  I DO NOT IDENTIFY SIMILAR FINDINGS IN THE RIGHT SIDE OF THE PELVIS.  THE BOWEL GAS PATTERN IS NORMAL.  PSOAS MARGINS ARE INTACT. IMPRESSION POSSIBLE 2-3 MM LEFT DISTAL URETERAL CALCULUS.  No results found for this or any previous visit.  No results found for this or any previous visit.  No results found for this or any previous visit.  Results for orders placed during the hospital encounter of 10/17/20  US RENAL  Narrative CLINICAL DATA:  Follow-up kidney stone  EXAM: RENAL / URINARY TRACT ULTRASOUND COMPLETE  COMPARISON:  04/27/2020 abdominal CT  FINDINGS: Right Kidney:  Renal measurements: 11 x 5 x 5 cm = volume: 146 mL. Two simple appearing cysts measuring up to 3 cm in the interpolar region. No hydronephrosis, mass, or visible calculus.  Left Kidney:  Renal measurements: 11 x 5 x 5 cm = volume: 146 mL. No hydronephrosis. Three echogenic foci with no arterial calcification on recent CT (which had excretory phase imaging such that calculi would be obscured). There is a shadowing stone at the hilum measuring 11 mm. Simple interpolar cyst measuring 3.9 cm.  Bladder:  Appears normal for degree of bladder distention.  Other:  Probable prostate calcification.  IMPRESSION: 1. 11 mm left renal calculus. There may be smaller nonshadowing calculi at the left upper and lower poles. 2. No hydronephrosis. 3. Simple renal cysts.   Electronically Signed By: Marnee Spring M.D. On: 10/18/2020 07:47  No results found for this or any previous visit.  No results found for this or any previous visit.  No results found for this or any previous visit.   Assessment & Plan:    1. Nephrolithiasis --We discussed the management of kidney stones. These options include observation, ureteroscopy, shockwave lithotripsy  ([facility]) and percutaneous nephrolithotomy (PCNL). We discussed which options are relevant to the patient's stone(s). We discussed the natural history of kidney stones as well as the complications of untreated stones and the impact on quality of life without treatment as well as with each of the above listed treatments. We also discussed the efficacy of each treatment in its ability to clear the stone burden. With any of these management options I discussed the signs and symptoms of infection and the need for emergent treatment should these be experienced. For each option we discussed the ability of each procedure to clear  the patient of their stone burden.   For observation I described the risks which include but are not limited to silent renal damage, life-threatening infection, need for emergent surgery, failure to pass stone and pain.   For ureteroscopy I described the risks which include bleeding, infection, damage to contiguous structures, positioning injury, ureteral stricture, ureteral avulsion, ureteral injury, need for prolonged ureteral stent, inability to perform ureteroscopy, need for an interval procedure, inability to clear stone burden, stent discomfort/pain, heart attack, stroke, pulmonary embolus and the inherent risks with general anesthesia.   For shockwave lithotripsy I described the risks which include arrhythmia, kidney contusion, kidney hemorrhage, need for transfusion, pain, inability to adequately break up stone, inability to pass stone fragments, Steinstrasse, infection associated with obstructing stones, need for alternate surgical procedure, need for repeat shockwave lithotripsy, MI, CVA, PE and the inherent risks with anesthesia/conscious sedation.   For PCNL I described the risks including positioning injury, pneumothorax, hydrothorax, need for chest tube, inability to clear stone burden, renal laceration, arterial venous fistula or malformation, need for embolization of kidney,  loss of kidney or renal function, need for repeat procedure, need for prolonged nephrostomy tube, ureteral avulsion, MI, CVA, PE and the inherent risks of general anesthesia.   - The patient would like to proceed with observation. RTc 6 months with renal US and KUB    No follow-ups on file.  Wilkie Aye, MD  [facility] [place]

## 2020-10-24 ENCOUNTER — Telehealth: Payer: Self-pay | Admitting: Urology

## 2020-10-24 NOTE — Telephone Encounter (Signed)
Pt called wanting to get the results of his urine test from 10/21/20.

## 2020-10-27 NOTE — Telephone Encounter (Signed)
Spoke with patient and reviewed urine test from office. Pt voiced understanding. Also talked about ultrasound. Pt voiced if he developed any pain from remaining stone he would reach out to our office.

## 2020-10-28 ENCOUNTER — Other Ambulatory Visit: Payer: Self-pay

## 2020-10-28 DIAGNOSIS — I714 Abdominal aortic aneurysm, without rupture, unspecified: Secondary | ICD-10-CM

## 2020-11-07 ENCOUNTER — Encounter: Payer: Self-pay | Admitting: Surgery

## 2020-11-07 ENCOUNTER — Institutional Professional Consult (permissible substitution): Payer: 59 | Admitting: Surgery

## 2020-11-07 ENCOUNTER — Other Ambulatory Visit: Payer: Self-pay

## 2020-11-07 VITALS — BP 167/116 | HR 99 | Resp 18 | Ht 71.0 in | Wt 199.8 lb

## 2020-11-07 DIAGNOSIS — I712 Thoracic aortic aneurysm, without rupture, unspecified: Secondary | ICD-10-CM

## 2020-11-07 NOTE — Progress Notes (Signed)
Cardiothoracic Surgery Consultation   PCP is Patient, No Pcp Per Referring Provider is Alan Shepherd  Chief Complaint  Patient presents with  . Thoracic Aortic Aneurysm    Initial surgical consult, CTA chest 04/27/2020    HPI:  The patient is a 59 year old gentleman with a history of hypertension, kidney stones, and a family history of premature coronary disease who was referred by his urologist for evaluation of a 4.8 cm fusiform ascending aortic aneurysm.  He had a CTA of the chest, abdomen, and pelvis on 04/27/2020 when he presented acutely to the emergency department with chest and abdominal pain.  BP at that time was 195/89.  CTA showed a 4.8 cm fusiform ascending aortic aneurysm.  There is no aortic dissection.  He was evaluated in 10/2013 by Alan Shepherd for hypertension and chest discomfort.  He was having atypical left shoulder and chest discomfort which he still complains about.  He had a CT cardiac scoring scan on 11/06/2013 which showed a calcium score of 0.  An ETT on 11/10/2013 was reportedly normal except for hypertensive response.  He was tried on losartan in the past which he stopped taking due to dizziness and lethargy.  He was also started on Lipitor due to high LDL but developed myalgias.  He has also been on Norvasc and Tenormin in the past but is not sure why those were discontinued.  He has not seen Alan Shepherd since 2017 and says that he did not have any follow-up arranged.  He does not have a PCP.  More recently he was started on telmisartan 40 mg daily but he is not sure who started that.  He has only been taking it for about 3 days and said that it is making him feel a little tired and he has not been sleeping as well.  He is here today by himself.  He is divorced from his wife who was bipolar.  He has 1 son.  He owns a [employer].  He does not smoke, drink alcohol, or use any kind of drugs.  There is no family history of aortic aneurysm,  aortic dissection, or connective tissue disorder. Past Medical History:  Diagnosis Date  . Complication of anesthesia    excessive memory loss with versed  . Environmental allergies   . Family history of premature coronary artery disease   . Hematuria   . History of exercise intolerance    11-10-2013   ETT normal   . History of kidney stones   . Hypertension    primary cardiolgoist-  Alan Shepherd (lov in epic 12-15-2015)---  11-10-2013 ETT normal   . Nephrolithiasis    per pt CT at Alan Shepherd showed bilateral renal stones  . Renal calculus, left   . White coat syndrome with hypertension     Past Surgical History:  Procedure Laterality Date  . CYSTOSCOPY W/ URETERAL STENT PLACEMENT Left 10/04/2017   Procedure: CYSTOSCOPY WITH LEFT  RETROGRADE PYELOGRAM/URETERAL STENT PLACEMENT;  Surgeon: Alan Shepherd, Mark, Shepherd;  Location: [facility];  Service: Urology;  Laterality: Left;  . CYSTOSCOPY WITH RETROGRADE PYELOGRAM, URETEROSCOPY AND STENT PLACEMENT Left 12/09/2017   Procedure: CYSTOSCOPY WITH RETROGRADE PYELOGRAM, URETEROSCOPY AND STENT EXCHANGE, BASKET STONE RETRIVAL;  Surgeon: Alan Shepherd, Alan Shepherd;  [facility];  Service: Urology;  Laterality: Left;  . CYSTOSCOPY/RETROGRADE/URETEROSCOPY/STONE EXTRACTION WITH BASKET Bilateral left 04-06-2003;  right 10-08-2007   [facility] Shepherd  [facility]  . HOLMIUM LASER APPLICATION Left 12/09/2017  Procedure: HOLMIUM LASER APPLICATION;  Surgeon: Alan Gauze, Shepherd;  [facility];  Service: Urology;  Laterality: Left;    Family History  Family history unknown: Yes    Social History Social History   Tobacco Use  . Smoking status: Never Smoker  . Smokeless tobacco: Never Used  Vaping Use  . Vaping Use: Never used  Substance Use Topics  . Alcohol use: No  . Drug use: No    No current outpatient medications on file.   No current facility-administered medications for this visit.    Allergies  Allergen  Reactions  . Bee Venom Anaphylaxis  . Fire [employer] Anaphylaxis  . Aspirin Itching  . Onion Itching  . Versed [Midazolam] Other (See Comments)    "Excessive memory loss for about six months"    Review of Systems  Constitutional: Negative for fatigue.  HENT: Negative.   Eyes: Negative.   Respiratory: Negative for shortness of breath.   Cardiovascular: Negative for palpitations.       Occasional episodes of left upper chest and left shoulder pain  Gastrointestinal: Negative.   Endocrine: Negative.   Genitourinary:       Kidney stone  Musculoskeletal: Negative.   Allergic/Immunologic: Negative.   Neurological: Positive for dizziness and headaches. Negative for syncope.  Hematological: Negative.   Psychiatric/Behavioral:       History of whitecoat hypertension    BP (!) 167/116 (BP Location: Left Arm, Patient Position: Sitting)   Pulse 99   Resp 18   Ht 5\' 11"  (1.803 m)   Wt 199 lb 12.8 oz (90.6 kg)   SpO2 97% Comment: RA with mask on  BMI 27.87 kg/m  Physical Exam Constitutional:      Appearance: Normal appearance. He is normal weight.  HENT:     Head: Normocephalic and atraumatic.     Mouth/Throat:     Mouth: Mucous membranes are moist.     Pharynx: Oropharynx is clear.  Eyes:     Extraocular Movements: Extraocular movements intact.     Pupils: Pupils are equal, round, and reactive to light.  Cardiovascular:     Rate and Rhythm: Normal rate and regular rhythm.     Pulses: Normal pulses.     Heart sounds: Normal heart sounds. No murmur heard.   Pulmonary:     Effort: Pulmonary effort is normal.     Breath sounds: Normal breath sounds.  Abdominal:     General: Abdomen is flat. Bowel sounds are normal.     Palpations: Abdomen is soft.  Musculoskeletal:        General: No swelling. Normal range of motion.     Cervical back: Normal range of motion and neck supple.  Skin:    General: Skin is warm and dry.  Neurological:     General: No focal deficit present.      Mental Status: He is alert and oriented to person, place, and time.  Psychiatric:        Mood and Affect: Mood normal.        Behavior: Behavior normal.      Diagnostic Tests:  Narrative & Impression  CLINICAL DATA:  Chest and abdominal pain.  EXAM: CT ANGIOGRAPHY CHEST, ABDOMEN AND PELVIS  TECHNIQUE: Non-contrast CT of the chest was initially obtained.  Multidetector CT imaging through the chest, abdomen and pelvis was performed using the standard protocol during bolus administration of intravenous contrast. Multiplanar reconstructed images and MIPs were obtained and reviewed to evaluate the  vascular anatomy.  CONTRAST:  27mL OMNIPAQUE IOHEXOL 350 MG/ML SOLN, OMNIPAQUE IOHEXOL 350 MG/ML SOLN  COMPARISON:  CT abdomen 08/27/2017 and CT of the heart from 2014  FINDINGS: CTA CHEST FINDINGS  Cardiovascular: The heart is normal in size. No pericardial effusion. There is fusiform aneurysmal dilatation of the ascending thoracic aorta with maximum measurement of 4.8 cm. This measured approximately 4.4 cm back in 2014. No dissection. Scattered atherosclerotic calcifications and at the aortic arch involving the descending thoracic aorta. The branch vessels are patent. The left vertebral artery originates directly from the aorta. Suspect scattered coronary artery calcifications.  The pulmonary arteries appear normal.  Mediastinum/Nodes: No mediastinal or hilar mass or adenopathy. The esophagus is grossly normal. The thyroid gland is unremarkable.  Lungs/Pleura: No acute pulmonary findings. No worrisome pulmonary lesions. A few small scattered subpleural pulmonary nodules are likely lymph nodes. No pleural effusions. No interstitial lung disease or bronchiectasis.  Musculoskeletal: No chest wall mass, supraclavicular or axillary adenopathy.  Review of the MIP images confirms the above findings.  CTA ABDOMEN AND PELVIS FINDINGS  VASCULAR  Aorta:  Normal caliber. No dissection. Minimal distal aortic atherosclerotic calcifications.  Celiac: Normal  SMA: Normal  Renals: Normal  IMA: Normal  Inflow: Normal  Veins: Grossly unremarkable.  Review of the MIP images confirms the above findings.  NON-VASCULAR  Hepatobiliary: Suspect fatty infiltration of the liver. No hepatic lesions. The gallbladder is normal. No common bile duct dilatation.  Pancreas: No mass, inflammation or ductal dilatation.  Spleen: Normal size.  No focal lesions.  Adrenals/Urinary Tract: The adrenal glands are normal.  Simple bilateral renal cysts. No worrisome renal lesions or hydronephrosis. Both ureters are normal. Bladder is unremarkable.  Stomach/Bowel: The stomach, duodenum, small bowel and colon are grossly normal without oral contrast. No inflammatory changes, mass lesions or obstructive findings. The appendix is normal.  Lymphatic: No mesenteric or retroperitoneal mass or adenopathy.  Reproductive: The prostate gland is mildly enlarged. There is median lobe hypertrophy impressing on the base of the bladder. The seminal vesicles appear normal.  Other: No pelvic mass or adenopathy. No free pelvic fluid collections. No inguinal mass or adenopathy. No abdominal wall hernia or subcutaneous lesions.  Musculoskeletal: No significant bony findings.  Review of the MIP images confirms the above findings.  IMPRESSION: 1. 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 No dissection. Recommend semi-annual imaging followup by CTA or MRA and referral to cardiothoracic surgery if not already obtained. This recommendation follows 2010 ACCF/AHA/AATS/ACR/ASA/SCA/SCAI/SIR/STS/SVM Guidelines for the Diagnosis and Management of Patients With Thoracic Aortic Disease. Circulation. 2010; 121: e266-e36. 2. No aortic dissection. Minimal scattered atherosclerotic calcifications. 3. No  acute abdominal/pelvic findings, mass lesions or lymphadenopathy. 4. Suspect fatty infiltration of the liver. 5. Simple bilateral renal cysts. 6. Aortic atherosclerosis.  Aortic Atherosclerosis (ICD10-I70.0).   Electronically Signed   By: Rudie Meyer M.D.   On: 04/27/2020 18:06     Impression:  This 59 year old gentleman has a 4.8 cm fusiform ascending aortic aneurysm with severe uncontrolled hypertension.  I have personally reviewed his CTA from June 2021 and compared to his previous cardiac scoring CT from 2014.  His previous CT did not have contrast but it appears that the diameter of the mid ascending aorta was about 4.8 cm at that time.  His aneurysm is below the surgical threshold of 5.5 cm.  I reviewed the CT images with the patient and answered all of his questions.  I stressed the  importance of good blood pressure control in preventing further enlargement and acute aortic dissection as well as other problems such as hypertensive cardiomyopathy, renal failure, and stroke.  He said that he occasionally checks his blood pressure at home but is vague about the results.  He said that he has been taking telmisartan 40 mg daily for the past 3 days but his blood pressure is still markedly elevated in the office today.  I told him that I thought controlling his blood pressure was the single most important healthcare issue for him at this time.  He is an intelligent, hard-working gentleman but I do not think he has much of a social safety net.  I advised him to get back in to see cardiology to work on tight blood pressure control and he is in agreement but would like to see a different cardiologist.  I will try to get him into see Alan. Duke Salvia if possible.  If this gentleman does not keep his blood pressure under good control he will be at high risk for aortic dissection in his lifetime.  I think he understands the importance of this.  Plan:  I will try to get him into see Alan. Duke Salvia for  evaluation of his hypertension.  He will return to see me in June 2022 with a CTA of the chest to follow-up on his ascending aortic aneurysm.  I spent 45 minutes performing this consultation and > 50% of this time was spent face to face counseling and coordinating the care of this patient's ascending aortic aneurysm.   Alleen Borne, [facility] [phone]

## 2020-11-23 ENCOUNTER — Encounter: Payer: 59 | Admitting: Surgery

## 2020-11-29 ENCOUNTER — Telehealth: Payer: Self-pay | Admitting: Cardiovascular Disease

## 2020-11-29 NOTE — Telephone Encounter (Signed)
Attempted to call patient, left message for patient to call back to office.   

## 2020-11-29 NOTE — Telephone Encounter (Signed)
New Message;     Pt asked me to send a message stating that he needs to see Dr Duke Salvia before 12-15-19. I explained that there were no openings for HTN Clinic before that date. He said he was having all kinds of problems with the medicine he is taking, he needs to be seen asap.

## 2020-11-30 NOTE — Telephone Encounter (Signed)
Spoke with pt, he has a new patient appointment in the HTN clinic 12-14-20 and he is currently not tolerating his current medication and would like to be seen sooner. He is currently taking telmisartan 40 mg once daily and reports it makes him just feel bad. He reports having bad dreams and feels sluggish on this medication. He has been taking it for 3 weeks now. His bp is running 134/90 to 147/86 on the medication. He is quit anxious and has a lot of things going on. He reports a kidney stone and a thoracic aneurysm and is anxious about getting his bp under better control. Will forward to dr Duke Salvia to review and advise

## 2020-11-30 NOTE — Telephone Encounter (Addendum)
Spoke with patient for over 24 minutes  Explained multiple times to him Dr Duke Salvia could not change any of his medications since she has never seen him before and that she had no available appointments Strongly recommended patient follow up with prescribing physician regarding how he is feeling taking this medication

## 2020-12-14 ENCOUNTER — Other Ambulatory Visit: Payer: Self-pay

## 2020-12-14 ENCOUNTER — Encounter: Payer: Self-pay | Admitting: Cardiovascular Disease

## 2020-12-14 ENCOUNTER — Ambulatory Visit (INDEPENDENT_AMBULATORY_CARE_PROVIDER_SITE_OTHER): Payer: 59 | Admitting: Cardiovascular Disease

## 2020-12-14 VITALS — BP 216/132 | HR 72 | Ht 71.0 in | Wt 202.0 lb

## 2020-12-14 DIAGNOSIS — R079 Chest pain, unspecified: Secondary | ICD-10-CM | POA: Diagnosis not present

## 2020-12-14 DIAGNOSIS — I712 Thoracic aortic aneurysm, without rupture, unspecified: Secondary | ICD-10-CM

## 2020-12-14 DIAGNOSIS — I719 Aortic aneurysm of unspecified site, without rupture: Secondary | ICD-10-CM | POA: Insufficient documentation

## 2020-12-14 DIAGNOSIS — I10 Essential (primary) hypertension: Secondary | ICD-10-CM | POA: Diagnosis not present

## 2020-12-14 DIAGNOSIS — I7121 Aneurysm of the ascending aorta, without rupture: Secondary | ICD-10-CM

## 2020-12-14 HISTORY — DX: Aneurysm of the ascending aorta, without rupture: I71.21

## 2020-12-14 HISTORY — DX: Thoracic aortic aneurysm, without rupture: I71.2

## 2020-12-14 MED ORDER — NEBIVOLOL HCL 5 MG PO TABS
5.0000 mg | ORAL_TABLET | Freq: Every day | ORAL | 1 refills | Status: DC
Start: 2020-12-14 — End: 2020-12-14

## 2020-12-14 MED ORDER — NEBIVOLOL HCL 10 MG PO TABS
10.0000 mg | ORAL_TABLET | Freq: Every day | ORAL | 1 refills | Status: DC
Start: 2020-12-14 — End: 2021-01-30

## 2020-12-14 NOTE — Patient Instructions (Addendum)
Medication Instructions:   STOP TELMISARTAN   START NEBIVILOL 10 MG DAILY   Labwork: YOU WILL NEED A COVID SCREENING 3 DAYS PRIOR TO YOUR STRESS MYOVIEW. ONCE YOU HAVE THE SCREENING YOU WILL NEED TO QUARANTINE UNTIL AFTER TEST   Testing/Procedures: Your physician has requested that you have en exercise stress myoview. For further information please visit [email or web address] Please follow instruction sheet, as given.  DO NOT TAKE YOUR NEBIVILOL  MORNING OF STRESS TEST    Follow-Up:  01/17/2021 AT 10:30 AM WITH PHARM D    Special Instructions:   MONITOR YOUR BLOOD PRESSURE TWICE A DAY, LOG IN THE BOOK PROVIDED. BRING THE BOOK AND YOUR BLOOD PRESSURE MACHINE TO YOUR FOLLOW UP IN 1 MONTH

## 2020-12-14 NOTE — Progress Notes (Signed)
Hypertension Clinic Initial Assessment:    Date:  12/14/2020   ID:  Alan Shepherd, DOB 08-May-1961, MRN [id number]  PCP:  Patient, No Pcp Per  Cardiologist:  No primary care provider on file.  Nephrologist:  Referring MD: Alleen Borne, MD   CC: Hypertension  History of Present Illness:    Alan Shepherd is a 60 y.o. male with  ascending aortic aneurysm, hypertension, bipolar disorder who is being seen today for the evaluation of hypertension at the request of Alleen Borne, MD.  Alan Shepherd saw Dr. Laneta Simmers on 10/2020.  He has been referred for an ascending aortic aneurysm 4.8 cm.  He has had multiple emergency department visits for poorly controlled blood pressure. He was last seen by [facility] 11/2015.  At that time he had been going through divorce and his blood pressure was running even higher than usual.  He reported atypical chest pain and there was some concern he may be going through manic episode.  He was on amlodipine and atenolol.  It was thought that anxiety was also contributing to his poorly controlled pressure.  Since that time both his antihypertensives were discontinued for unclear reasons.  He reports intolerances but is unsure the problem was.  He was subsequently started on losartan which was stopped due to dizziness.  When he saw Dr. Laneta Simmers his BP was 167/116 on telmisartan.  He started taking it 10/2020.  He finished the bottle but doesn't like how it feels.  He was taking it 1/2 tablet in the am and 1/2 in the evening.  It gives him spikes in the back of his head.  It also seems to be contributing to ED. 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  He tries to have a healthy diet but struggles because of being on the road so much.  He does avoid red meat and tries to limit salt as much as he can.  He reports that he is active but does not get much formal exercise.   He has no exertional chest pain or shortness of breath.  He  complains of pain in his left shoulder that is not particularly exertional.  It is worse when he presses on it.  He has no LE edema, orthopnea or PND. At home he has been checking his BP and it has been in the 120s-140s/90s.  He notes that it is always much higher in the doctor's office and he has whitecoat hypertension.  He does struggle with anxiety but denies any depression lately.  He sings in the choir and plays often.  He has various other non-cardiac complaints including not feeling like himself, odd sensations in his throat, and difficulty eating.  Previous antihypertensives: Losartan-  Amlodipine- tired atenolol  Past Medical History:  Diagnosis Date  . Aneurysm of ascending aorta (HCC) 12/14/2020  . Complication of anesthesia    excessive memory loss with versed  . Environmental allergies   . Family history of premature coronary artery disease   . Hematuria   . History of exercise intolerance    11-10-2013   ETT normal   . History of kidney stones   . Hypertension    primary cardiolgoist-  dr Eden Emms (lov in epic 12-15-2015)---  11-10-2013 ETT normal   . Nephrolithiasis    per pt CT at dr Ronne Binning showed bilateral renal stones  . Renal calculus, left   . White coat syndrome with hypertension  Past Surgical History:  Procedure Laterality Date  . CYSTOSCOPY W/ URETERAL STENT PLACEMENT Left 10/04/2017   Procedure: CYSTOSCOPY WITH LEFT  RETROGRADE PYELOGRAM/URETERAL STENT PLACEMENT;  Surgeon: Ihor Gully, MD;  Location: [facility];  Service: Urology;  Laterality: Left;  . CYSTOSCOPY WITH RETROGRADE PYELOGRAM, URETEROSCOPY AND STENT PLACEMENT Left 12/09/2017   Procedure: CYSTOSCOPY WITH RETROGRADE PYELOGRAM, URETEROSCOPY AND STENT EXCHANGE, BASKET STONE RETRIVAL;  Surgeon: Malen Gauze, MD;  Location[facility];  Service: Urology;  Laterality: Left;  . CYSTOSCOPY/RETROGRADE/URETEROSCOPY/STONE EXTRACTION WITH BASKET Bilateral left 04-06-2003;  right  10-08-2007   dr Stark Bray  [facility]  . HOLMIUM LASER APPLICATION Left 12/09/2017   Procedure: HOLMIUM LASER APPLICATION;  Surgeon: Malen Gauze, MD;  Location[facility];  Service: Urology;  Laterality: Left;    Current Medications: Current Meds  Medication Sig  . [DISCONTINUED] nebivolol (BYSTOLIC) 5 MG tablet Take 1 tablet (5 mg total) by mouth daily.  . [DISCONTINUED] telmisartan (MICARDIS) 40 MG tablet Take 40 mg by mouth daily.     Allergies:   Bee venom, Animator, Animator (solenopsis [place]) allergy skin test, Gramineae pollens, Aspirin, Onion, and Versed [midazolam]   Social History   Socioeconomic History  . Marital status: Legally Separated    Spouse name: Not on file  . Number of children: Not on file  . Years of education: Not on file  . Highest education level: Not on file  Occupational History  . Not on file  Tobacco Use  . Smoking status: Never Smoker  . Smokeless tobacco: Never Used  Vaping Use  . Vaping Use: Never used  Substance and Sexual Activity  . Alcohol use: No  . Drug use: No  . Sexual activity: Not on file  Other Topics Concern  . Not on file  Social History Narrative  . Not on file   Social Determinants of Health   Financial Resource Strain: Not on file  Food Insecurity: Not on file  Transportation Needs: Not on file  Physical Activity: Not on file  Stress: Not on file  Social Connections: Not on file     Family History: The patient's family history includes Hypertension in his father, sister, and sister.  ROS:   Please see the history of present illness.    All other systems reviewed and are negative.  EKGs/Labs/Other Studies Reviewed:    EKG:  EKG is ordered today.  The ekg ordered today demonstrates sinus rhythm.  Rate 72 bpm.    Recent Labs: 04/27/2020: ALT 22; BUN 17; Creatinine, Ser 1.21; Hemoglobin 12.8; Platelets 182; Potassium 3.6; Sodium 139   Recent Lipid Panel    Component Value Date/Time    CHOL 221 (H) 11/06/2017 1151   TRIG 145.0 11/06/2017 1151   HDL 45.10 11/06/2017 1151   CHOLHDL 5 11/06/2017 1151   VLDL 29.0 11/06/2017 1151   LDLCALC 147 (H) 11/06/2017 1151   LDLDIRECT 169.4 11/06/2013 1221    Physical Exam:    VS:  BP (!) 216/132 (BP Location: Left Arm)   Pulse 72   Ht 5\' 11"  (1.803 m)   Wt 202 lb (91.6 kg)   SpO2 99%   BMI 28.17 kg/m  , BMI Body mass index is 28.17 kg/m. GENERAL:  Well appearing.  Exremely anxious HEENT: Pupils equal round and reactive, fundi not visualized, oral mucosa unremarkable NECK:  No jugular venous distention, waveform within normal limits, carotid upstroke brisk and symmetric LUNGS:  Clear to auscultation bilaterally HEART:  RRR.  PMI not displaced or sustained,S1 and S2 within normal limits, no S3, no S4, no clicks, no rubs,  murmurs ABD:  Flat, positive bowel sounds normal in frequency in pitch, no bruits, no rebound, no guarding, no midline pulsatile mass, no hepatomegaly, no splenomegaly EXT:  2 plus pulses throughout, no edema, no cyanosis no clubbing SKIN:  No rashes no nodules NEURO:  Cranial nerves II through XII grossly intact, motor grossly intact throughout PSYCH:  Cognitively intact, oriented to person place and time.  Pressured speech and tangential.    ASSESSMENT:    1. Essential hypertension   2. Chest pain of uncertain etiology   3. Primary hypertension   4. Thoracic aortic aneurysm without rupture (HCC)   5. Aneurysm of ascending aorta (HCC)     PLAN:    # Essential hypertension:  # Ascending aorta aneurysm: Blood pressure is poorly controlled.  His blood pressure is much higher today in the office than it has been at home.  Of asked him to bring his machine to his follow-up appointment.  He has intolerances to multiple medications.  Telmisartan does seem to be working well but he is not tolerating it or losartan.  We discussed importance of trying to get him on a beta-blocker possible to reduce the shear  stress with his lisinopril aortic aneurysm.  He has not tolerated metoprolol or atenolol in the past.  We will try nebivolol 10 mg daily.  I suspect he will need more than this.  However, due to his multiple intolerances we need to go slow and had only one medicine at once.  I did suggest that we just reduce the dose of his telmisartan, but he is unwilling to continue it.  Discussed limiting sodium intake and try to get at least 150 minutes of exercise weekly.  Recommended referral to care guide for stress management and he declined.  Sher  # L shoulder pain:  Symptoms are not exertional, but he is having much formal exercise.  We discussed the fact that this is likely musculoskeletal.  However he is very anxious.  Recommend getting an exercise Myoview to rule out ischemia.  Time spent: 55 minutes-Greater than 50% of this time was spent in counseling, explanation of diagnosis, planning of further management, and coordination of care.   Disposition:    FU with MD/PharmD in 1 month   .tc  Medication Adjustments/Labs and Tests Ordered: Current medicines are reviewed at length with the patient today.  Concerns regarding medicines are outlined above.  Orders Placed This Encounter  Procedures  . MYOCARDIAL PERFUSION IMAGING  . EKG 12-Lead   Meds ordered this encounter  Medications  . DISCONTD: nebivolol (BYSTOLIC) 5 MG tablet    Sig: Take 1 tablet (5 mg total) by mouth daily.    Dispense:  90 tablet    Refill:  1  . nebivolol (BYSTOLIC) 10 MG tablet    Sig: Take 1 tablet (10 mg total) by mouth daily.    Dispense:  90 tablet    Refill:  1    D/C 5 MG RX     Signed, Chilton Si, MD  12/14/2020 1:53 PM    [facility]

## 2020-12-15 ENCOUNTER — Other Ambulatory Visit: Payer: Self-pay | Admitting: *Deleted

## 2020-12-15 DIAGNOSIS — R072 Precordial pain: Secondary | ICD-10-CM

## 2020-12-19 ENCOUNTER — Other Ambulatory Visit: Payer: Self-pay | Admitting: *Deleted

## 2020-12-19 DIAGNOSIS — R0789 Other chest pain: Secondary | ICD-10-CM

## 2020-12-20 ENCOUNTER — Telehealth: Payer: Self-pay | Admitting: Cardiovascular Disease

## 2020-12-20 NOTE — Telephone Encounter (Signed)
Called pharmacy to inquire about the PA, no PA required for generic only NB patient requested Spoke with patient regarding the Bystolic Patient stated when he went to pick up Bystolic pharmacy did not have generic and did not request NB Called [facility], spoke with Shanda Bumps and they had Nebivolol patient did request NB Explained to patient unless he tries and fails Nebivolol PA will not be approved  Advised patient to take the Bystolic since he has paid for it and then get generic next refill, verbalized understanding

## 2020-12-20 NOTE — Telephone Encounter (Signed)
Pt c/o medication issue:  1. Name of Medication:   nebivolol (BYSTOLIC) 10 MG tablet   2. How are you currently taking this medication (dosage and times per day)? Hasn't started medication   3. Are you having a reaction (difficulty breathing--STAT)? No   4. What is your medication issue? Abe is calling stating this medication cost him $180, but he is wanting to continue taking it and does not wish to switch to an alternative. He states the pharmacy advised him he is needing prior authorization for the medication and he is requesting that process be started. He did pick up the prescription, but has not yet started it. Please advise.

## 2020-12-23 ENCOUNTER — Telehealth: Payer: Self-pay | Admitting: Cardiovascular Disease

## 2020-12-23 ENCOUNTER — Other Ambulatory Visit (HOSPITAL_COMMUNITY)
Admission: RE | Admit: 2020-12-23 | Discharge: 2020-12-23 | Disposition: A | Discharge status: Home / Self Care | Payer: 59 | Source: Ambulatory Visit | Attending: Cardiovascular Disease | Admitting: Cardiovascular Disease

## 2020-12-23 DIAGNOSIS — Z20822 Contact with and (suspected) exposure to covid-19: Secondary | ICD-10-CM | POA: Diagnosis not present

## 2020-12-23 DIAGNOSIS — Z01812 Encounter for preprocedural laboratory examination: Secondary | ICD-10-CM | POA: Insufficient documentation

## 2020-12-23 NOTE — Telephone Encounter (Signed)
Left message for [facility] to call and confirm what medication needs PA.

## 2020-12-23 NOTE — Telephone Encounter (Signed)
New Message:   Aram Beecham called from [facility] and said they need a prior authorization on pt's Nebivolol.( she thinks it is this medicine, was not quite sure)

## 2020-12-24 LAB — SARS CORONAVIRUS 2 (TAT 6-24 HRS): SARS Coronavirus 2: NEGATIVE

## 2020-12-27 ENCOUNTER — Other Ambulatory Visit: Payer: Self-pay

## 2020-12-27 ENCOUNTER — Ambulatory Visit (HOSPITAL_COMMUNITY)
Admission: RE | Admit: 2020-12-27 | Discharge: 2020-12-27 | Disposition: A | Discharge status: Home / Self Care | Payer: 59 | Source: Ambulatory Visit | Attending: Cardiovascular Disease | Admitting: Cardiovascular Disease

## 2020-12-27 DIAGNOSIS — I10 Essential (primary) hypertension: Secondary | ICD-10-CM | POA: Diagnosis not present

## 2020-12-27 DIAGNOSIS — R079 Chest pain, unspecified: Secondary | ICD-10-CM | POA: Diagnosis present

## 2020-12-27 LAB — MYOCARDIAL PERFUSION IMAGING
Estimated workload: 10.1 METS
Exercise duration (min): 10 min
Exercise duration (sec): 0 s
LV dias vol: 145 mL (ref 62–150)
LV sys vol: 75 mL
MPHR: 161 {beats}/min
Peak HR: 146 {beats}/min
Percent HR: 90 %
Rest HR: 60 {beats}/min
SDS: 3
SRS: 1
SSS: 4
TID: 0.97

## 2020-12-27 MED ORDER — TECHNETIUM TC 99M TETROFOSMIN IV KIT
10.4000 | PACK | Freq: Once | INTRAVENOUS | Status: AC | PRN
  Administered 2020-12-27: 10.4 via INTRAVENOUS
  Filled 2020-12-27: qty 11

## 2020-12-27 MED ORDER — TECHNETIUM TC 99M TETROFOSMIN IV KIT
28.2000 | PACK | Freq: Once | INTRAVENOUS | Status: AC | PRN
  Administered 2020-12-27: 28.2 via INTRAVENOUS
  Filled 2020-12-27: qty 29

## 2021-01-03 ENCOUNTER — Ambulatory Visit: Payer: 59

## 2021-01-03 NOTE — Progress Notes (Deleted)
.  hc2

## 2021-01-17 ENCOUNTER — Ambulatory Visit: Payer: 59

## 2021-01-19 ENCOUNTER — Ambulatory Visit: Payer: 59

## 2021-01-19 ENCOUNTER — Ambulatory Visit: Payer: 59 | Admitting: Cardiovascular Disease

## 2021-01-30 ENCOUNTER — Encounter: Payer: Self-pay | Admitting: Cardiovascular Disease

## 2021-01-30 ENCOUNTER — Telehealth: Payer: Self-pay | Admitting: Urology

## 2021-01-30 ENCOUNTER — Telehealth (INDEPENDENT_AMBULATORY_CARE_PROVIDER_SITE_OTHER): Payer: 59 | Admitting: Cardiovascular Disease

## 2021-01-30 DIAGNOSIS — I712 Thoracic aortic aneurysm, without rupture: Secondary | ICD-10-CM

## 2021-01-30 DIAGNOSIS — I7121 Aneurysm of the ascending aorta, without rupture: Secondary | ICD-10-CM

## 2021-01-30 DIAGNOSIS — I10 Essential (primary) hypertension: Secondary | ICD-10-CM | POA: Diagnosis not present

## 2021-01-30 DIAGNOSIS — R931 Abnormal findings on diagnostic imaging of heart and coronary circulation: Secondary | ICD-10-CM

## 2021-01-30 MED ORDER — HYDRALAZINE HCL 25 MG PO TABS: 25.0000 mg | ORAL_TABLET | Freq: Two times a day (BID) | ORAL | 5 refills | Status: AC

## 2021-01-30 NOTE — Progress Notes (Signed)
Virtual Visit via Telephone Note   This visit type was conducted due to national recommendations for restrictions regarding the COVID-19 Pandemic (e.g. social distancing) in an effort to limit this patient's exposure and mitigate transmission in our community.  Due to his co-morbid illnesses, this patient is at least at moderate risk for complications without adequate follow up.  This format is felt to be most appropriate for this patient at this time.  The patient did not have access to video technology/had technical difficulties with video requiring transitioning to audio format only (telephone).  All issues noted in this document were discussed and addressed.  No physical exam could be performed with this format.  Please refer to the patient's chart for his  consent to telehealth for [facility]   The patient was identified using 2 identifiers.  Date:  01/30/2021   ID:  Alan Shepherd, DOB [birth year], MRN [id number]  Patient Location: Home Provider Location: Home Office  PCP:  Patient, No Pcp Per  Cardiologist:  No primary care provider on file.  Electrophysiologist:  None   Evaluation Performed:  Follow-Up Visit  Chief Complaint:  hypertension  History of Present Illness:    The patient does not have symptoms concerning for COVID-19 infection (fever, chills, cough, or new shortness of breath).   Hypertension Clinic Initial Assessment:    Date:  01/30/2021   Alan Shepherd is a 60 y.o. male with ascending aortic aneurysm, and hypertension here for follow up.  Alan Shepherd saw Dr. Laneta Simmers on 10/2020.  He has been referred for an ascending aortic aneurysm 4.8 cm.  He has had multiple emergency department visits for poorly controlled blood pressure.  He was last seen by [facility] 11/2015.  At that time he had been going through divorce and his blood pressure was running even higher than usual.  He reported atypical chest pain and there was some concern he may be going through manic  episode.  He was on amlodipine and atenolol.  It was thought that anxiety was also contributing to his poorly controlled pressure.  Since that time both his antihypertensives were discontinued for unclear reasons.  He reports intolerances but is unsure the problem was.  He was subsequently started on losartan which was stopped due to dizziness.  When he saw Dr. Laneta Simmers his BP was 167/116 on telmisartan.  He started taking it 10/2020.  He finished the bottle but doesn't like how it feels.  He was taking it 1/2 tablet in the am and 1/2 in the evening.  It gives him spikes in the back of his head.  It also seems to be contributing to ED. 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  He tries to have a healthy diet but struggles because of being on the road so much.  He does avoid red meat and tries to limit salt as much as he can.  He reports that he is active but does not get much formal exercise.   He has no exertional chest pain or shortness of breath.  He complains of pain in his left shoulder that is not particularly exertional.  It is worse when he presses on it.  He has no LE edema, orthopnea or PND. At home he has been checking his BP and it has been in the 120s-140s/90s.  He notes that it is always much higher in the doctor's office and he has whitecoat hypertension.  He does struggle  with anxiety but denies any depression lately.  He sings in the choir and plays often.  He has various other non-cardiac complaints including not feeling like himself, odd sensations in his throat, and difficulty eating.  At his last appointment he reported atypical chest pain that seemed musculoskeletal.  However he was anxious about it so we got all exercise Myoview.  Stress test on 2/22 revealed LVEF 48% and no ischemia.  It was recommended that he have an echocardiogram to follow-up.  He was started on nebivolol but there are cost issues.  He has been struggling with kidney  stone.  He thinks that he will need lithotripsy.  He has been in more pain and thinks it makes his BP high.  He took nebivolol and noted that his heart rate was in the 40s and he felt very tired.  He reduced the dose to 5mg  but still didn't like the way it made him feel.  His BP at home has been in the 130s/80s but he thinks it gets higher in the day time.  He can't give specifics about his numbers but knows that it gets high.  Previous antihypertensives: Losartan Amlodipine- tired Atenolol Nebivolol-feels poorly telmisartan Diuretics-don't work with lifestyle   Past Medical History:  Diagnosis Date  . Aneurysm of ascending aorta (HCC) 12/14/2020  . Complication of anesthesia    excessive memory loss with versed  . Environmental allergies   . Family history of premature coronary artery disease   . Hematuria   . History of exercise intolerance    11-10-2013   ETT normal   . History of kidney stones   . Hypertension    primary cardiolgoist-  dr 11-12-2013 (lov in epic 12-15-2015)---  11-10-2013 ETT normal   . Nephrolithiasis    per pt CT at dr 11-12-2013 showed bilateral renal stones  . Renal calculus, left   . White coat syndrome with hypertension     Past Surgical History:  Procedure Laterality Date  . CYSTOSCOPY W/ URETERAL STENT PLACEMENT Left 10/04/2017   Procedure: CYSTOSCOPY WITH LEFT  RETROGRADE PYELOGRAM/URETERAL STENT PLACEMENT;  Surgeon: 10/06/2017, MD;  Location: [facility];  Service: Urology;  Laterality: Left;  . CYSTOSCOPY WITH RETROGRADE PYELOGRAM, URETEROSCOPY AND STENT PLACEMENT Left 12/09/2017   Procedure: CYSTOSCOPY WITH RETROGRADE PYELOGRAM, URETEROSCOPY AND STENT EXCHANGE, BASKET STONE RETRIVAL;  Surgeon: 12/11/2017, MD;  Location: [facility];  Service: Urology;  Laterality: Left;  . CYSTOSCOPY/RETROGRADE/URETEROSCOPY/STONE EXTRACTION WITH BASKET Bilateral left 04-06-2003;  right 10-08-2007   dr 10-10-2007  [facility]  . HOLMIUM LASER APPLICATION  Left 12/09/2017   Procedure: HOLMIUM LASER APPLICATION;  Surgeon: 12/11/2017, MD;  Location: [facility];  Service: Urology;  Laterality: Left;    Current Medications: Current Meds  Medication Sig  . hydrALAZINE (APRESOLINE) 25 MG tablet Take 1 tablet (25 mg total) by mouth in the morning and at bedtime.     Allergies:   Bee venom, [facility] Animator (solenopsis Animator) allergy skin test, Gramineae pollens, Aspirin, Bystolic [nebivolol hcl], Onion, and Versed [midazolam]   Social History   Socioeconomic History  . Marital status: Legally Separated    Spouse name: Not on file  . Number of children: Not on file  . Years of education: Not on file  . Highest education level: Not on file  Occupational History  . Not on file  Tobacco Use  . Smoking status: Never Smoker  . Smokeless tobacco: Never Used  Vaping  Use  . Vaping Use: Never used  Substance and Sexual Activity  . Alcohol use: No  . Drug use: No  . Sexual activity: Not on file  Other Topics Concern  . Not on file  Social History Narrative  . Not on file   Social Determinants of Health   Financial Resource Strain: Not on file  Food Insecurity: Not on file  Transportation Needs: Not on file  Physical Activity: Not on file  Stress: Not on file  Social Connections: Not on file     Family History: The patient's family history includes Hypertension in his father, sister, and sister.  ROS:   Please see the history of present illness.    All other systems reviewed and are negative.  EKGs/Labs/Other Studies Reviewed:    EKG:  EKG is no ordered today.  The ekg ordered today demonstrates sinus rhythm.  Rate 72 bpm.    Exercise Myoview 12/27/20:  Nuclear stress EF: 48%.  The left ventricular ejection fraction is mildly decreased (45-54%).  The study is normal.  This is a low risk study.  No evidence of ischemia.  Systolic function is mildly reduced. Consider echocardiogram to correlate  findings as wall motion appears normal visually.   Recent Labs: 04/27/2020: ALT 22; BUN 17; Creatinine, Ser 1.21; Hemoglobin 12.8; Platelets 182; Potassium 3.6; Sodium 139   Recent Lipid Panel    Component Value Date/Time   CHOL 221 (H) 11/06/2017 1151   TRIG 145.0 11/06/2017 1151   HDL 45.10 11/06/2017 1151   CHOLHDL 5 11/06/2017 1151   VLDL 29.0 11/06/2017 1151   LDLCALC 147 (H) 11/06/2017 1151   LDLDIRECT 169.4 11/06/2013 1221    Physical Exam:    There were no vitals taken for this visit. GENERAL: Well-appearing.  No acute distress. HEENT: Pupils equal round.  Oral mucosa unremarkable NECK:  No jugular venous distention, no visible thyromegaly EXT:  No edema, no cyanosis no clubbing SKIN:  No rashes no nodules NEURO:  Speech fluent.  Cranial nerves grossly intact.  Moves all 4 extremities freely PSYCH:  Cognitively intact, oriented to person place and time  ASSESSMENT:    1. Primary hypertension   2. Aneurysm of ascending aorta (HCC)   3. Decreased cardiac ejection fraction     PLAN:    # Essential hypertension:  # Ascending aorta aneurysm: Blood pressure is poorly controlled.  His blood pressures are much higher today in the office than it has been at home.  He has been intolerant to many medications in the past.  He gets extremely anxious when checking his blood pressure, though he reports he is not anxious in general.  We have stopped nebivolol.  We will try hydralazine 25 mg twice daily.  Bring blood pressure log and machine to follow-up.  Consider ambulatory testing.  # L shoulder pain:   Exercise Myoview was negative for ischemia.  # Reduced systolic function:   Stress test revealed reduced systolic function.  We will get an echo to better assess.  Clinically does not have heart failure.  Time spent: 45 minutes-Greater than 50% of this time was spent in counseling, explanation of diagnosis, planning of further management, and coordination of  care.   Disposition:    FU with MD/PharmD in 4-6 weeks  COVID-19 Education: The signs and symptoms of COVID-19 were discussed with the patient and how to seek care for testing (follow up with PCP or arrange E-visit).  The importance of social distancing was discussed today.  Time:   Today, I have spent 35 minutes with the patient with telehealth technology discussing the above problems.  Medication Adjustments/Labs and Tests Ordered: Current medicines are reviewed at length with the patient today.  Concerns regarding medicines are outlined above.  Orders Placed This Encounter  Procedures  . ECHOCARDIOGRAM COMPLETE   Meds ordered this encounter  Medications  . hydrALAZINE (APRESOLINE) 25 MG tablet    Sig: Take 1 tablet (25 mg total) by mouth in the morning and at bedtime.    Dispense:  60 tablet    Refill:  5     Signed, Chilton Si, MD  01/30/2021 4:49 PM    [facility]

## 2021-01-30 NOTE — Patient Instructions (Addendum)
Medication Instructions:  START HYDRALAZINE 25 MG TWICE A DAY   *If you need a refill on your cardiac medications before your next appointment, please call your pharmacy*  Lab Work: NONE  Testing/Procedures: Your physician has requested that you have an echocardiogram. Echocardiography is a painless test that uses sound waves to create images of your heart. It provides your doctor with information about the size and shape of your heart and how well your heart's chambers and valves are working. This procedure takes approximately one hour. There are no restrictions for this procedure. [facility] AT [address]   Follow-Up: At [facility], you and your health needs are our priority.  As part of our continuing mission to provide you with exceptional heart care, we have created designated Provider Care Teams.  These Care Teams include your primary Cardiologist (physician) and Advanced Practice Providers (APPs -  Physician Assistants and Nurse Practitioners) who all work together to provide you with the care you need, when you need it.  We recommend signing up for the patient portal called "MyChart".  Sign up information is provided on this After Visit Summary.  MyChart is used to connect with patients for Virtual Visits (Telemedicine).  Patients are able to view lab/test results, encounter notes, upcoming appointments, etc.  Non-urgent messages can be sent to your provider as well.   To learn more about what you can do with MyChart, go to [email or web address].    Your next appointment:   03/15/2021 AT 9:30 AM WITH DR [facility]

## 2021-01-31 ENCOUNTER — Other Ambulatory Visit: Payer: Self-pay

## 2021-01-31 ENCOUNTER — Ambulatory Visit (HOSPITAL_BASED_OUTPATIENT_CLINIC_OR_DEPARTMENT_OTHER)
Admission: RE | Admit: 2021-01-31 | Discharge: 2021-01-31 | Disposition: A | Discharge status: Home / Self Care | Payer: 59 | Source: Ambulatory Visit | Attending: Urology | Admitting: Urology

## 2021-01-31 DIAGNOSIS — N401 Enlarged prostate with lower urinary tract symptoms: Secondary | ICD-10-CM | POA: Insufficient documentation

## 2021-01-31 DIAGNOSIS — N200 Calculus of kidney: Secondary | ICD-10-CM

## 2021-01-31 DIAGNOSIS — I878 Other specified disorders of veins: Secondary | ICD-10-CM | POA: Insufficient documentation

## 2021-01-31 DIAGNOSIS — N281 Cyst of kidney, acquired: Secondary | ICD-10-CM | POA: Diagnosis not present

## 2021-01-31 NOTE — Telephone Encounter (Signed)
Opened in error

## 2021-02-01 ENCOUNTER — Ambulatory Visit (INDEPENDENT_AMBULATORY_CARE_PROVIDER_SITE_OTHER): Payer: 59 | Admitting: Urology

## 2021-02-01 ENCOUNTER — Other Ambulatory Visit: Payer: Self-pay

## 2021-02-01 ENCOUNTER — Encounter: Payer: Self-pay | Admitting: Urology

## 2021-02-01 ENCOUNTER — Ambulatory Visit (HOSPITAL_COMMUNITY)
Admission: RE | Admit: 2021-02-01 | Discharge: 2021-02-01 | Disposition: A | Discharge status: Home / Self Care | Payer: 59 | Source: Ambulatory Visit | Attending: Urology | Admitting: Urology

## 2021-02-01 VITALS — Temp 97.9°F | Ht 71.0 in | Wt 200.0 lb

## 2021-02-01 DIAGNOSIS — N200 Calculus of kidney: Secondary | ICD-10-CM

## 2021-02-01 LAB — URINALYSIS, ROUTINE W REFLEX MICROSCOPIC
Bilirubin, UA: NEGATIVE
Glucose, UA: NEGATIVE
Ketones, UA: NEGATIVE
Leukocytes,UA: NEGATIVE
Nitrite, UA: NEGATIVE
Protein,UA: NEGATIVE
RBC, UA: NEGATIVE
Specific Gravity, UA: 1.015 (ref 1.005–1.030)
Urobilinogen, Ur: 0.2 mg/dL (ref 0.2–1.0)
pH, UA: 5.5 (ref 5.0–7.5)

## 2021-02-01 NOTE — Progress Notes (Signed)
Urological Symptom Review  Patient is experiencing the following symptoms: Get up at night to urinate  Kidney stones  Review of Systems  Gastrointestinal (upper)  : Negative for upper GI symptoms  Gastrointestinal (lower) : Negative for lower GI symptoms  Constitutional : Negative for symptoms  Skin: Negative for skin symptoms  Eyes: Negative for eye symptoms  Ear/Nose/Throat : Negative for Ear/Nose/Throat symptoms  Hematologic/Lymphatic: Negative for Hematologic/Lymphatic symptoms  Cardiovascular : Negative for cardiovascular symptoms  Respiratory : Negative for respiratory symptoms  Endocrine: Negative for endocrine symptoms  Musculoskeletal: Back pain Joint pain  Neurological: Negative for neurological symptoms  Psychologic: Negative for psychiatric symptoms

## 2021-02-01 NOTE — Patient Instructions (Signed)

## 2021-02-01 NOTE — Progress Notes (Signed)
02/01/2021 9:35 AM   Alan Shepherd 04-29-1961 [id number]  Referring provider: No referring provider defined for this encounter.  Right flank pain  HPI: Alan Shepherd is a 59yo here with new onset right flank pain. His pain started 1 week ago and is sharp, intermittent severe and nonraditing. Renal US does not show a calculus but KUB shows a possible mid ureteral 16mm calculus. CT showed this was a pelvic calcification. He has a 59mm left lower pole calculus.  He has associated nausea.  He has intermittent urinary frequency and urgency. He has intermittent dysuria.     PMH: Past Medical History:  Diagnosis Date  . Aneurysm of ascending aorta (HCC) 12/14/2020  . Complication of anesthesia    excessive memory loss with versed  . Environmental allergies   . Family history of premature coronary artery disease   . Hematuria   . History of exercise intolerance    11-10-2013   ETT normal   . History of kidney stones   . Hypertension    primary cardiolgoist-  dr Eden Emms (lov in epic 12-15-2015)---  11-10-2013 ETT normal   . Nephrolithiasis    per pt CT at dr Ronne Binning showed bilateral renal stones  . Renal calculus, left   . White coat syndrome with hypertension     Surgical History: Past Surgical History:  Procedure Laterality Date  . CYSTOSCOPY W/ URETERAL STENT PLACEMENT Left 10/04/2017   Procedure: CYSTOSCOPY WITH LEFT  RETROGRADE PYELOGRAM/URETERAL STENT PLACEMENT;  Surgeon: Ihor Gully, MD;  Location: [facility];  Service: Urology;  Laterality: Left;  . CYSTOSCOPY WITH RETROGRADE PYELOGRAM, URETEROSCOPY AND STENT PLACEMENT Left 12/09/2017   Procedure: CYSTOSCOPY WITH RETROGRADE PYELOGRAM, URETEROSCOPY AND STENT EXCHANGE, BASKET STONE RETRIVAL;  Surgeon: Malen Gauze, MD;  Location: [facility];  Service: Urology;  Laterality: Left;  . CYSTOSCOPY/RETROGRADE/URETEROSCOPY/STONE EXTRACTION WITH BASKET Bilateral left 04-06-2003;  right 10-08-2007   dr Stark Bray  [facility]   . HOLMIUM LASER APPLICATION Left 12/09/2017   Procedure: HOLMIUM LASER APPLICATION;  Surgeon: Malen Gauze, MD;  Location: [facility];  Service: Urology;  Laterality: Left;    Home Medications:  Allergies as of 02/01/2021      Reactions   Bee Venom Anaphylaxis   Fire Ant Anaphylaxis   Fire Ant (solenopsis [place]) Allergy Skin Test Anaphylaxis   Gramineae Pollens Anaphylaxis   Aspirin Itching   Bystolic [nebivolol Hcl]    Low HR, forgetful    Onion Itching   Versed [midazolam] Other (See Comments)   "Excessive memory loss for about six months"      Medication List       Accurate as of February 01, 2021  9:35 AM. If you have any questions, ask your nurse or doctor.        hydrALAZINE 25 MG tablet Commonly known as: APRESOLINE Take 1 tablet (25 mg total) by mouth in the morning and at bedtime.       Allergies:  Allergies  Allergen Reactions  . Bee Venom Anaphylaxis  . Fire [employer] Anaphylaxis  . Fire [employer] (Solenopsis [place]) Allergy Skin Test Anaphylaxis  . Gramineae Pollens Anaphylaxis  . Aspirin Itching  . Bystolic [Nebivolol Hcl]     Low HR, forgetful   . Onion Itching  . Versed [Midazolam] Other (See Comments)    "Excessive memory loss for about six months"    Family History: Family History  Problem Relation Age of Onset  . Hypertension Father   . Hypertension Sister   .  Hypertension Sister     Social History:  reports that he has never smoked. He has never used smokeless tobacco. He reports that he does not drink alcohol and does not use drugs.  ROS: All other review of systems were reviewed and are negative except what is noted above in HPI  Physical Exam: Temp 97.9 F (36.6 C)   Ht 5\' 11"  (1.803 m)   Wt 200 lb (90.7 kg)   BMI 27.89 kg/m   Constitutional:  Alert and oriented, No acute distress. HEENT: [place] AT, moist mucus membranes.  Trachea midline, no masses. Cardiovascular: No clubbing, cyanosis, or edema. Respiratory: Normal  respiratory effort, no increased work of breathing. GI: Abdomen is soft, nontender, nondistended, no abdominal masses GU: No CVA tenderness.  Lymph: No cervical or inguinal lymphadenopathy. Skin: No rashes, bruises or suspicious lesions. Neurologic: Grossly intact, no focal deficits, moving all 4 extremities. Psychiatric: Normal mood and affect.  Laboratory Data: Lab Results  Component Value Date   WBC 6.5 04/27/2020   HGB 12.8 (L) 04/27/2020   HCT 40.3 04/27/2020   MCV 66.0 (L) 04/27/2020   PLT 182 04/27/2020    Lab Results  Component Value Date   CREATININE 1.21 04/27/2020    Lab Results  Component Value Date   PSA 0.8 04/04/2017    No results found for: TESTOSTERONE  No results found for: HGBA1C  Urinalysis    Component Value Date/Time   COLORURINE STRAW (A) 04/27/2020 1835   APPEARANCEUR Clear 10/21/2020 1319   LABSPEC >1.046 (H) 04/27/2020 1835   PHURINE 5.0 04/27/2020 1835   GLUCOSEU Negative 10/21/2020 1319   GLUCOSEU NEGATIVE 11/06/2017 1151   HGBUR NEGATIVE 04/27/2020 1835   BILIRUBINUR Negative 10/21/2020 1319   KETONESUR NEGATIVE 04/27/2020 1835   PROTEINUR Negative 10/21/2020 1319   PROTEINUR NEGATIVE 04/27/2020 1835   UROBILINOGEN 0.2 11/06/2017 1151   NITRITE Negative 10/21/2020 1319   NITRITE NEGATIVE 04/27/2020 1835   LEUKOCYTESUR Negative 10/21/2020 1319   LEUKOCYTESUR NEGATIVE 04/27/2020 1835    Lab Results  Component Value Date   LABMICR See below: 10/21/2020   WBCUA None seen 10/21/2020   LABEPIT None seen 10/21/2020   MUCUS pos 06/28/2012   BACTERIA None seen 10/21/2020    Pertinent Imaging: KUb and renal 14/01/2020 yesterday: Images reviewed and discussed with the patient Results for orders placed in visit on 01/05/03  DG Abd 1 View  Narrative FINDINGS CLINICAL DATA:  HX URINARY TRACT CALCULI, NOW W/LUQ & LT. FLANK PAIN. ABDOMEN - AP VIEW NO COMPARISONS.  NO INTRARENAL CALCULI ARE IDENTIFIED.  THERE IS A TINY CALCIFICATION IN THE  LEFT SIDE OF THE PELVIS WHICH COULD REPRESENT A SMALL UPJ CALCULUS.  I DO NOT IDENTIFY SIMILAR FINDINGS IN THE RIGHT SIDE OF THE PELVIS.  THE BOWEL GAS PATTERN IS NORMAL.  PSOAS MARGINS ARE INTACT. IMPRESSION POSSIBLE 2-3 MM LEFT DISTAL URETERAL CALCULUS.  No results found for this or any previous visit.  No results found for this or any previous visit.  No results found for this or any previous visit.  Results for orders placed during the hospital encounter of 10/17/20  10/19/20 RENAL  Narrative CLINICAL DATA:  Follow-up kidney stone  EXAM: RENAL / URINARY TRACT ULTRASOUND COMPLETE  COMPARISON:  04/27/2020 abdominal CT  FINDINGS: Right Kidney:  Renal measurements: 11 x 5 x 5 cm = volume: 146 mL. Two simple appearing cysts measuring up to 3 cm in the interpolar region. No hydronephrosis, mass, or visible calculus.  Left Kidney:  Renal measurements: 11 x 5 x 5 cm = volume: 146 mL. No hydronephrosis. Three echogenic foci with no arterial calcification on recent CT (which had excretory phase imaging such that calculi would be obscured). There is a shadowing stone at the hilum measuring 11 mm. Simple interpolar cyst measuring 3.9 cm.  Bladder:  Appears normal for degree of bladder distention.  Other:  Probable prostate calcification.  IMPRESSION: 1. 11 mm left renal calculus. There may be smaller nonshadowing calculi at the left upper and lower poles. 2. No hydronephrosis. 3. Simple renal cysts.   Electronically Signed By: Marnee Spring M.D. On: 10/18/2020 07:47  No results found for this or any previous visit.  No results found for this or any previous visit.  No results found for this or any previous visit.   Assessment & Plan:    1. Nephrolithiasis --We discussed the management of kidney stones. These options include observation, ureteroscopy, shockwave lithotripsy (ESWL) and percutaneous nephrolithotomy (PCNL). We discussed which options are relevant to  the patient's stone(s). We discussed the natural history of kidney stones as well as the complications of untreated stones and the impact on quality of life without treatment as well as with each of the above listed treatments. We also discussed the efficacy of each treatment in its ability to clear the stone burden. With any of these management options I discussed the signs and symptoms of infection and the need for emergent treatment should these be experienced. For each option we discussed the ability of each procedure to clear the patient of their stone burden.   For observation I described the risks which include but are not limited to silent renal damage, life-threatening infection, need for emergent surgery, failure to pass stone and pain.   For ureteroscopy I described the risks which include bleeding, infection, damage to contiguous structures, positioning injury, ureteral stricture, ureteral avulsion, ureteral injury, need for prolonged ureteral stent, inability to perform ureteroscopy, need for an interval procedure, inability to clear stone burden, stent discomfort/pain, heart attack, stroke, pulmonary embolus and the inherent risks with general anesthesia.   For shockwave lithotripsy I described the risks which include arrhythmia, kidney contusion, kidney hemorrhage, need for transfusion, pain, inability to adequately break up stone, inability to pass stone fragments, Steinstrasse, infection associated with obstructing stones, need for alternate surgical procedure, need for repeat shockwave lithotripsy, MI, CVA, PE and the inherent risks with anesthesia/conscious sedation.   For PCNL I described the risks including positioning injury, pneumothorax, hydrothorax, need for chest tube, inability to clear stone burden, renal laceration, arterial venous fistula or malformation, need for embolization of kidney, loss of kidney or renal function, need for repeat procedure, need for prolonged nephrostomy  tube, ureteral avulsion, MI, CVA, PE and the inherent risks of general anesthesia.   - The patient would like to proceed with Left ESWL - Urinalysis, Routine w reflex microscopic   No follow-ups on file.  Wilkie Aye, MD  [facility] [place]

## 2021-02-02 ENCOUNTER — Encounter: Payer: Self-pay | Admitting: Urology

## 2021-02-08 NOTE — Pre-Procedure Instructions (Signed)
   RG     1        [name] "[name]" Male, 60 y.o., May 26, 1961  MRN:  [id number] Phone:  [phone] Judie Petit)       PCP:  Patient, No Pcp Per Coverage:  [facility]  Next Appt With AP-DOIBP PAT 1 02/09/2021 at 4:15 PM           FW: litho valium Received: 2 days ago Ferdinand Lango, RN  Elsie Amis, RN  Just FYI        Previous Messages   ----- Message -----  From: Malen Gauze, MD  Sent: 02/06/2021  8:31 AM EDT  To: Ferdinand Lango, RN  Subject: RE: litho valium                 Ok to proceed with valium  ----- Message -----  From: Ferdinand Lango, RN  Sent: 02/01/2021  1:16 PM EDT  To: Malen Gauze, MD, Elsie Amis, RN  Subject: litho valium                   Patient is scheduled for litho 03/29   Midazolam- allergy per pt- severe memory loss. Do you want to proceed with valium or give something else?

## 2021-02-09 ENCOUNTER — Other Ambulatory Visit: Payer: Self-pay

## 2021-02-09 ENCOUNTER — Encounter (HOSPITAL_COMMUNITY)
Admission: RE | Admit: 2021-02-09 | Discharge: 2021-02-09 | Disposition: A | Discharge status: Home / Self Care | Payer: 59 | Source: Ambulatory Visit | Attending: Urology | Admitting: Urology

## 2021-02-13 ENCOUNTER — Other Ambulatory Visit: Payer: Self-pay

## 2021-02-13 ENCOUNTER — Telehealth: Payer: Self-pay

## 2021-02-13 ENCOUNTER — Other Ambulatory Visit (HOSPITAL_COMMUNITY)
Admission: RE | Admit: 2021-02-13 | Discharge: 2021-02-13 | Disposition: A | Discharge status: Home / Self Care | Payer: 59 | Source: Ambulatory Visit | Attending: Urology | Admitting: Urology

## 2021-02-13 DIAGNOSIS — Z20822 Contact with and (suspected) exposure to covid-19: Secondary | ICD-10-CM | POA: Diagnosis not present

## 2021-02-13 DIAGNOSIS — Z01812 Encounter for preprocedural laboratory examination: Secondary | ICD-10-CM | POA: Diagnosis present

## 2021-02-13 NOTE — Telephone Encounter (Signed)
Pt called saying he had not gotten in touch with anyone about the time or further instructions for his lithotripsy. I gave him number to scheduler and went over pre instructions with pt again.

## 2021-02-14 ENCOUNTER — Encounter (HOSPITAL_COMMUNITY): Admission: RE | Payer: Self-pay | Source: Ambulatory Visit

## 2021-02-14 ENCOUNTER — Ambulatory Visit: Payer: 59

## 2021-02-14 ENCOUNTER — Ambulatory Visit (HOSPITAL_COMMUNITY): Admission: RE | Admit: 2021-02-14 | Payer: 59 | Source: Ambulatory Visit | Admitting: Urology

## 2021-02-14 LAB — SARS CORONAVIRUS 2 (TAT 6-24 HRS): SARS Coronavirus 2: NEGATIVE

## 2021-02-14 SURGERY — LITHOTRIPSY, ESWL
Anesthesia: LOCAL | Laterality: Left

## 2021-02-14 MED ORDER — DIAZEPAM 5 MG PO TABS: 10.0000 mg | ORAL_TABLET | Freq: Once | ORAL | Status: AC

## 2021-02-14 MED ORDER — SODIUM CHLORIDE 0.9 % IV SOLN: INTRAVENOUS | Status: AC

## 2021-02-14 NOTE — Progress Notes (Signed)
Talked with pt. Stated that he would be coming for procedure tomorrow. Stated that he had a driver to take him home. Pre-op instructions reviewed. Voiced understanding.

## 2021-02-14 NOTE — OR Nursing (Signed)
Procedure cancelled due to insurance denial.

## 2021-02-21 ENCOUNTER — Telehealth (HOSPITAL_COMMUNITY): Payer: Self-pay | Admitting: Cardiovascular Disease

## 2021-02-21 NOTE — Telephone Encounter (Signed)
pt cancelled his echocardiogram due to he  had knee injury and unable to walk and he also has a concern not sure about what insurance is going to do. I gave him the number to the billing department to reach out to them for help. Patient states he has a lot going on and will call us back to reschedule. Order will be removed from the WQ and when he calls back we will reinstate the order. Thank you.

## 2021-02-23 ENCOUNTER — Other Ambulatory Visit (HOSPITAL_COMMUNITY): Payer: 59

## 2021-03-07 ENCOUNTER — Ambulatory Visit: Payer: 59 | Admitting: Urology

## 2021-03-07 DIAGNOSIS — N200 Calculus of kidney: Secondary | ICD-10-CM

## 2021-03-15 ENCOUNTER — Ambulatory Visit: Payer: 59 | Admitting: Cardiovascular Disease

## 2021-03-15 NOTE — Progress Notes (Deleted)
Advanced Hypertension Follow up Note   Date:  03/15/2021   ID:  Alan Shepherd, DOB 05/05/1961, MRN [id number]  PCP:  Patient, No Pcp Per (Inactive)  Cardiologist:  No primary care provider on file.  Electrophysiologist:  None   Evaluation Performed:  Follow-Up Visit  Chief Complaint:  hypertension  History of Present Illness:     Alan Shepherd is a 60 y.o. male with ascending aortic aneurysm, and hypertension here for follow up.  Mr. Alan Shepherd saw Dr. Laneta Simmers on 10/2020.  He has been referred for an ascending aortic aneurysm 4.8 cm.  He has had multiple emergency department visits for poorly controlled blood pressure.  He was last seen by [facility] 11/2015.  At that time he had been going through divorce and his blood pressure was running even higher than usual.  He reported atypical chest pain and there was some concern he may be going through manic episode.  He was on amlodipine and atenolol.  It was thought that anxiety was also contributing to his poorly controlled pressure.  Since that time both his antihypertensives were discontinued for unclear reasons.  He reports intolerances but is unsure the problem was.  He was subsequently started on losartan which was stopped due to dizziness.  When he saw Dr. Laneta Simmers his BP was 167/116 on telmisartan.  He started taking it 10/2020.  He finished the bottle but doesn't like how it feels.  He was taking it 1/2 tablet in the am and 1/2 in the evening.  It gives him spikes in the back of his head.  It also seems to be contributing to ED. 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  He tries to have a healthy diet but struggles because of being on the road so much.  He does avoid red meat and tries to limit salt as much as he can.  He reports that he is active but does not get much formal exercise.   He has no exertional chest pain or shortness of breath.  He complains of pain in his left shoulder that is not  particularly exertional.  It is worse when he presses on it.  He has no LE edema, orthopnea or PND. At home he has been checking his BP and it has been in the 120s-140s/90s.  He notes that it is always much higher in the doctor's office and he has whitecoat hypertension.  He does struggle with anxiety but denies any depression lately.  He sings in the choir and plays often.  He has various other non-cardiac complaints including not feeling like himself, odd sensations in his throat, and difficulty eating.  Mr. Alan Shepherd reported atypical chest pain that seemed musculoskeletal.  However he was anxious about it so we got an exercise Myoview.  Stress test on 2/22 revealed LVEF 48% and no ischemia.  It was recommended that he have an echocardiogram to follow-up.  He was started on nebivolol but there are cost issues.  He has been struggling with kidney stone.  He was seen by urology and received shockwave therapy.   He took nebivolol and noted that his heart rate was in the 40s and he felt very tired.  He reduced the dose to 5mg  but still didn't like the way it made him feel.  His BP at home has been in the 130s/80s but he thinks it gets higher in the day time.  He can't give specifics about  his numbers but knows that it gets high.  At his last appointment he was struggling with blood pressures that were high in the office but not as high at home.  He was started on hydralazine and asked to bring his blood pressure cuff to follow-up.  Previous antihypertensives: Losartan Amlodipine- tired Atenolol Nebivolol-feels poorly telmisartan Diuretics-don't work with lifestyle   Past Medical History:  Diagnosis Date  . Aneurysm of ascending aorta (HCC) 12/14/2020  . Complication of anesthesia    excessive memory loss with versed  . Environmental allergies   . Family history of premature coronary artery disease   . Hematuria   . History of exercise intolerance    11-10-2013   ETT normal   . History of kidney  stones   . Hypertension    primary cardiolgoist-  dr Eden Emms (lov in epic 12-15-2015)---  11-10-2013 ETT normal   . Nephrolithiasis    per pt CT at dr Ronne Binning showed bilateral renal stones  . Renal calculus, left   . White coat syndrome with hypertension     Past Surgical History:  Procedure Laterality Date  . CYSTOSCOPY W/ URETERAL STENT PLACEMENT Left 10/04/2017   Procedure: CYSTOSCOPY WITH LEFT  RETROGRADE PYELOGRAM/URETERAL STENT PLACEMENT;  Surgeon: Ihor Gully, MD;  Location: [facility];  Service: Urology;  Laterality: Left;  . CYSTOSCOPY WITH RETROGRADE PYELOGRAM, URETEROSCOPY AND STENT PLACEMENT Left 12/09/2017   Procedure: CYSTOSCOPY WITH RETROGRADE PYELOGRAM, URETEROSCOPY AND STENT EXCHANGE, BASKET STONE RETRIVAL;  Surgeon: Malen Gauze, MD;  Location: [facility];  Service: Urology;  Laterality: Left;  . CYSTOSCOPY/RETROGRADE/URETEROSCOPY/STONE EXTRACTION WITH BASKET Bilateral left 04-06-2003;  right 10-08-2007   dr Stark Bray  [facility]  . HOLMIUM LASER APPLICATION Left 12/09/2017   Procedure: HOLMIUM LASER APPLICATION;  Surgeon: Malen Gauze, MD;  Location: [facility];  Service: Urology;  Laterality: Left;    Current Medications: No outpatient medications have been marked as taking for the 03/15/21 encounter (Appointment) with Chilton Si, MD.     Allergies:   Bee venom, Fire ant, Fire ant (solenopsis [place]) allergy skin test, Gramineae pollens, Aspirin, Bystolic [nebivolol hcl], Onion, and Versed [midazolam]   Social History   Socioeconomic History  . Marital status: Divorced    Spouse name: Not on file  . Number of children: Not on file  . Years of education: Not on file  . Highest education level: Not on file  Occupational History  . Not on file  Tobacco Use  . Smoking status: Never Smoker  . Smokeless tobacco: Never Used  Vaping Use  . Vaping Use: Never used  Substance and Sexual Activity  . Alcohol use: No  .  Drug use: No  . Sexual activity: Not on file  Other Topics Concern  . Not on file  Social History Narrative  . Not on file   Social Determinants of Health   Financial Resource Strain: Not on file  Food Insecurity: Not on file  Transportation Needs: Not on file  Physical Activity: Not on file  Stress: Not on file  Social Connections: Not on file     Family History: The patient's family history includes Hypertension in his father, sister, and sister.  ROS:   Please see the history of present illness.    All other systems reviewed and are negative.  EKGs/Labs/Other Studies Reviewed:    EKG:  EKG is no ordered today.  The ekg ordered today demonstrates sinus rhythm.  Rate 72 bpm.  Exercise Myoview 12/27/20:  Nuclear stress EF: 48%.  The left ventricular ejection fraction is mildly decreased (45-54%).  The study is normal.  This is a low risk study.  No evidence of ischemia.  Systolic function is mildly reduced. Consider echocardiogram to correlate findings as wall motion appears normal visually.   Recent Labs: 04/27/2020: ALT 22; BUN 17; Creatinine, Ser 1.21; Hemoglobin 12.8; Platelets 182; Potassium 3.6; Sodium 139   Recent Lipid Panel    Component Value Date/Time   CHOL 221 (H) 11/06/2017 1151   TRIG 145.0 11/06/2017 1151   HDL 45.10 11/06/2017 1151   CHOLHDL 5 11/06/2017 1151   VLDL 29.0 11/06/2017 1151   LDLCALC 147 (H) 11/06/2017 1151   LDLDIRECT 169.4 11/06/2013 1221    Physical Exam:    VS:  There were no vitals taken for this visit. , BMI There is no height or weight on file to calculate BMI. GENERAL:  Well appearing HEENT: Pupils equal round and reactive, fundi not visualized, oral mucosa unremarkable NECK:  No jugular venous distention, waveform within normal limits, carotid upstroke brisk and symmetric, no bruits, no thyromegaly LYMPHATICS:  No cervical adenopathy LUNGS:  Clear to auscultation bilaterally HEART:  RRR.  PMI not displaced or  sustained,S1 and S2 within normal limits, no S3, no S4, no clicks, no rubs, *** murmurs ABD:  Flat, positive bowel sounds normal in frequency in pitch, no bruits, no rebound, no guarding, no midline pulsatile mass, no hepatomegaly, no splenomegaly EXT:  2 plus pulses throughout, no edema, no cyanosis no clubbing SKIN:  No rashes no nodules NEURO:  Cranial nerves II through XII grossly intact, motor grossly intact throughout PSYCH:  Cognitively intact, oriented to person place and time   ASSESSMENT:    No diagnosis found.  PLAN:    # Essential hypertension:  # Ascending aorta aneurysm: Blood pressure is poorly controlled.  His blood pressures are much higher today in the office than it has been at home.  He has been intolerant to many medications in the past.  He gets extremely anxious when checking his blood pressure, though he reports he is not anxious in general.  We have stopped nebivolol.  We will try hydralazine 25 mg twice daily.  Bring blood pressure log and machine to follow-up.  Consider ambulatory testing.  # L shoulder pain:   Exercise Myoview was negative for ischemia.  # Reduced systolic function:   Stress test revealed reduced systolic function.  We will get an echo to better assess.  Clinically does not have heart failure.  Time spent: 45 minutes-Greater than 50% of this time was spent in counseling, explanation of diagnosis, planning of further management, and coordination of care.   Disposition:    FU with MD/PharmD in 4-6 weeks  COVID-19 Education: The signs and symptoms of COVID-19 were discussed with the patient and how to seek care for testing (follow up with PCP or arrange E-visit).  The importance of social distancing was discussed today.  Time:   Today, I have spent 35 minutes with the patient with telehealth technology discussing the above problems.  Medication Adjustments/Labs and Tests Ordered: Current medicines are reviewed at length with the patient  today.  Concerns regarding medicines are outlined above.  No orders of the defined types were placed in this encounter.  No orders of the defined types were placed in this encounter.    Signed, Chilton Si, MD  03/15/2021 8:09 AM    [facility]

## 2021-04-11 ENCOUNTER — Other Ambulatory Visit: Payer: Self-pay | Admitting: Surgery

## 2021-04-11 DIAGNOSIS — I712 Thoracic aortic aneurysm, without rupture, unspecified: Secondary | ICD-10-CM

## 2021-04-13 ENCOUNTER — Other Ambulatory Visit: Payer: Self-pay

## 2021-04-13 DIAGNOSIS — N200 Calculus of kidney: Secondary | ICD-10-CM

## 2021-04-19 ENCOUNTER — Ambulatory Visit (HOSPITAL_COMMUNITY): Payer: 59

## 2021-04-21 ENCOUNTER — Ambulatory Visit: Payer: 59 | Admitting: Urology

## 2021-04-21 DIAGNOSIS — N200 Calculus of kidney: Secondary | ICD-10-CM

## 2021-05-24 ENCOUNTER — Inpatient Hospital Stay: Admission: RE | Admit: 2021-05-24 | Payer: 59 | Source: Ambulatory Visit

## 2021-05-24 ENCOUNTER — Ambulatory Visit: Payer: 59 | Admitting: Surgery

## 2021-09-27 NOTE — Progress Notes (Unsigned)
Department of Endocrinology  History & Physical     Name/MRN: Ricardo Andrade, [id number] Date of service: 10/02/2021   Age/DOB: 60 y.o., 02/24/1961 Chief Complaint: No chief complaint on file.     HISTORY OF PRESENTING ILLNESS:     Ricardo Andrade) is a 60 y.o. male with PMH of hypothyroidism, GERD, allergic rhinitis, hearing loss, and type II DM who presents to Endocrinology Clinic as a referral from his PCP for evaluation of subclinical hypothyroidism secondary to Hashimoto's thyroiditis.    Reviewed referral notes from PCP, Dr. Reuel Derby: Patient had a TSH in 06/2020 which was mildly elevated to 4.86. He had positive TPO antibodies in 11/2012 and 117 in 05/2020.     Pertinent History:  -Most recent thyroid labs: 06/2020- TSH 4.86; free T4 0.85 on 05/25/2020 and TSH 5.38  -Prior known thyroid disease: Hypothyroidism diagnosed a few months ago  -History of thyroid cancer:  No  -History of radiation to the head or neck:  No  -History of thyroid surgery: No  -Taking any iodine supplementation: No  -Recent IV contrast administration (within past 6 months): No  -Recent upper respiratory or GI tract infection (within past 6 months):  No  -Family history of thyroid disease:  Mother- hypothyroidism  -Family history of thyroid cancer:  No  -Family history of autoimmune diseases: No  -Biotin or herbal supplements: No biotin; takes turmeric (joints, plays basketball), vitamin D, apple cider vinegar and cinnamon (diabetes)  -Steroid use (PO, injection, cream): No  -Taking Synthroid, Armour Thyroid, Nature Thyroid, Tirosint, any other thyroid med: Levothyroxine 25 mcg qAM-- was started a few months ago   -Takes thyroid medication on an empty stomach/first thing in the morning: Yes   -Missing thyroid medication doses: No    Symptoms:  -Fatigue/Energy level:  Yes, comes and goes  -Mood changes:  No  -Weight changes:  Stable  -Tremors:  No  -Palpitations:  No  -Skin/Hair/Nail changes:  No  -Constipation or Diarrhea:  Some diarrhea if takes metformin on an empty stomach  -Abdominal Pain:  No  -Hot/cold intolerance:  No  -Sweating: No  -Increased neck size:  No  -Shortness of breath, choking, difficulty swallowing:  No  -Anterior neck pain:  No  -Voice changes:  No  -Dry or gritty sensation in the eyes:  No  -Increased prominence of the eyes:  No       MEDICAL HISTORY:   PAST MEDICAL & SURGICAL HISTORIES:   Past Medical History:   Diagnosis Date   . Allergic rhinitis    . GERD (gastroesophageal reflux disease)    . Hearing loss    . HTN    . Hypothyroidism    . Type II diabetes mellitus (CMS HCC)         Past Surgical History:   Procedure Laterality Date   . HX OTHER      Scrotal blood vessel removal for rupture   . HX TENDON REPAIR Right     Right arm             HOME MEDICATIONS:  Current Outpatient Medications   Medication Sig   . APPLE CIDER VINEGAR ORAL Take by mouth   . benazepril (LOTENSIN) 5 mg Oral Tablet take 40 mg by mouth Once a day.     . cholecalciferol, vitamin D3, 25 mcg (1,000 unit) Oral Tablet Take 1,000 Units by mouth Once a day   . cinnamon bark (CINNAMON ORAL) Take by mouth   . lansoprazole (  PREVACID SOLUTAB) 15 mg Oral Tablet,Rapid Dissolve, DR Take 15 mg by mouth Once a day   . levothyroxine (SYNTHROID) 25 mcg Oral Tablet Take 25 mcg by mouth Every morning   . MetFORMIN (GLUCOPHAGE) 1,000 mg Oral Tablet Take 1,000 mg by mouth Twice daily with food   . TURMERIC ORAL Take by mouth         ALLERGIES:  No Known Allergies     FAMILY HISTORY:  Family Medical History:     Problem Relation (Age of Onset)    Breast Cancer Sister    Diabetes type II Mother, Brother    Heart Disease Brother    Hypertension (High Blood Pressure) Father    Hypothyroidism Mother          SOCIAL HISTORY:  Social History     Tobacco Use   . Smoking status: Never   . Smokeless tobacco: Former     Types: Snuff     Quit date: 1997   Substance Use Topics   . Alcohol use: Yes     Alcohol/week: 14.0 standard drinks     Types: 14 Cans of beer per  week   . Drug use: Never   Works as a Emergency planning/management officer in [employer].  Lives in [place], [place] with his wife.     REVIEW OF SYSTEMS:     ROS: All systems reviewed and negative except for as above.    EXAMINATION:   Vitals: There were no vitals taken for this visit.   Physical Exam:  General: Well appearing male in no acute distress.  Psychiatric: Normal mood and affect  Neuro: Alert and oriented x 3. Speech fluent. Cranial nerves II-XII intact. No focal deficits noted.  HEENT: Head normocephalic, atraumatic. PERRLA, EOMI, conjunctiva clear. Wearing a face mask.   Thyroid/Neck: Trachea midline  Lungs: Normal respiratory effort.   Cardiac: Regular rate and rhythm  Abdomen: Non-distended  Extremities: No edema noted bilaterally  Skin: No visible rashes.    DATA REVIEWED:   I have reviewed previous labs, tests, imaging, and notes.    Component   Ref Range & Units 12/18/2012   THYROPEROXIDASE (TPO) ANTIBODIES, SERUM   <51 IU/mL 120     Reviewed outside labs:  -TSH 4.86 in 06/2020, 5.85 in 05/2020    ASSESSMENT & PLAN:     No orders of the defined types were placed in this encounter.      Ricardo Andrade) is a 60 y.o. male with PMH of hypothyroidism, GERD, allergic rhinitis, hearing loss, and type II DM who presents to Endocrinology Clinic as a referral from his PCP for evaluation of subclinical hypothyroidism secondary to Hashimoto's thyroiditis.    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 Discussed that TPO antibodies do not need to be trended as the level does not correlate with thyroid function. Discussed that TPO antibodies themselves do not cause symptoms unless they are causing hypothyroidism  -Most recent TSH slightly elevated to 4.86. Discussed that most people feel best with TSH on the faster side of normal (goal TSH around 1-2 rather than 3-4)  -Will repeat TSH and adjust levothyroxine as  needed. Continue levothyroxine 25 mcg qAM for now    Return to clinic in 1 year    Ricardo Levan, MD, MPH 09/27/2021, 14:42

## 2021-10-02 ENCOUNTER — Ambulatory Visit (HOSPITAL_BASED_OUTPATIENT_CLINIC_OR_DEPARTMENT_OTHER): Payer: 59 | Admitting: "Endocrinology

## 2021-12-27 ENCOUNTER — Other Ambulatory Visit: Payer: Self-pay | Admitting: Urology

## 2021-12-27 DIAGNOSIS — N200 Calculus of kidney: Secondary | ICD-10-CM

## 2021-12-29 ENCOUNTER — Ambulatory Visit (HOSPITAL_COMMUNITY)
Admission: RE | Admit: 2021-12-29 | Discharge: 2021-12-29 | Disposition: A | Discharge status: Home / Self Care | Payer: Managed Care, Other (non HMO) | Source: Ambulatory Visit | Attending: Urology | Admitting: Urology

## 2021-12-29 ENCOUNTER — Other Ambulatory Visit: Payer: Self-pay

## 2021-12-29 ENCOUNTER — Encounter: Payer: Self-pay | Admitting: Urology

## 2021-12-29 ENCOUNTER — Ambulatory Visit (INDEPENDENT_AMBULATORY_CARE_PROVIDER_SITE_OTHER): Payer: Managed Care, Other (non HMO) | Admitting: Urology

## 2021-12-29 VITALS — Wt 202.0 lb

## 2021-12-29 DIAGNOSIS — N200 Calculus of kidney: Secondary | ICD-10-CM | POA: Diagnosis present

## 2021-12-29 DIAGNOSIS — Z125 Encounter for screening for malignant neoplasm of prostate: Secondary | ICD-10-CM | POA: Diagnosis not present

## 2021-12-29 LAB — URINALYSIS, ROUTINE W REFLEX MICROSCOPIC
Bilirubin, UA: NEGATIVE
Glucose, UA: NEGATIVE
Ketones, UA: NEGATIVE
Leukocytes,UA: NEGATIVE
Nitrite, UA: NEGATIVE
Protein,UA: NEGATIVE
RBC, UA: NEGATIVE
Specific Gravity, UA: 1.015 (ref 1.005–1.030)
Urobilinogen, Ur: 0.2 mg/dL (ref 0.2–1.0)
pH, UA: 5.5 (ref 5.0–7.5)

## 2021-12-29 MED ORDER — TAMSULOSIN HCL 0.4 MG PO CAPS: 0.4000 mg | ORAL_CAPSULE | Freq: Every day | ORAL | 1 refills | Status: DC

## 2021-12-29 MED ORDER — OXYCODONE-ACETAMINOPHEN 5-325 MG PO TABS: 1.0000 | ORAL_TABLET | ORAL | 0 refills | Status: DC | PRN

## 2021-12-29 MED ORDER — TAMSULOSIN HCL 0.4 MG PO CAPS: 0.4000 mg | ORAL_CAPSULE | Freq: Every day | ORAL | 11 refills | Status: DC

## 2021-12-29 MED ORDER — ONDANSETRON HCL 4 MG PO TABS: 4.0000 mg | ORAL_TABLET | Freq: Three times a day (TID) | ORAL | 0 refills | Status: DC | PRN

## 2021-12-29 NOTE — Progress Notes (Signed)
12/29/2021 10:07 AM   Alan Shepherd 04/21/1961 [id number]  Referring provider: No referring provider defined for this encounter.  nephrolithiasis   HPI: [name] is a 61yo here for evaluation of nephrolithiasis. Starting 2 weeks ago he developed right flank pain. The pain is sharp, intermittent, moderate to severe right flank pain. He has intermittent urinary frequency, urgency, and urinary hesitancy. UA today is normal. KUB today shows 74mm right mid ureteral calculus.. He has intermittent nausea and no vomiting. No other complaints today   PMH: Past Medical History:  Diagnosis Date   Aneurysm of ascending aorta (HCC) 12/14/2020   Complication of anesthesia    excessive memory loss with versed   Environmental allergies    Family history of premature coronary artery disease    Hematuria    History of exercise intolerance    11-10-2013   ETT normal    History of kidney stones    Hypertension    primary cardiolgoist-  dr Eden Emms (lov in epic 12-15-2015)---  11-10-2013 ETT normal    Nephrolithiasis    per pt CT at dr Ronne Binning showed bilateral renal stones   Renal calculus, left    White coat syndrome with hypertension     Surgical History: Past Surgical History:  Procedure Laterality Date   CYSTOSCOPY W/ URETERAL STENT PLACEMENT Left 10/04/2017   Procedure: CYSTOSCOPY WITH LEFT  RETROGRADE PYELOGRAM/URETERAL STENT PLACEMENT;  Surgeon: Ihor Gully, MD;  Location: [facility];  Service: Urology;  Laterality: Left;   CYSTOSCOPY WITH RETROGRADE PYELOGRAM, URETEROSCOPY AND STENT PLACEMENT Left 12/09/2017   Procedure: CYSTOSCOPY WITH RETROGRADE PYELOGRAM, URETEROSCOPY AND STENT EXCHANGE, BASKET STONE RETRIVAL;  Surgeon: Malen Gauze, MD;  Location[facility];  Service: Urology;  Laterality: Left;   CYSTOSCOPY/RETROGRADE/URETEROSCOPY/STONE EXTRACTION WITH BASKET Bilateral left 04-06-2003;  right 10-08-2007   dr Stark Bray  [facility]   HOLMIUM LASER APPLICATION Left  12/09/2017   Procedure: HOLMIUM LASER APPLICATION;  Surgeon: Malen Gauze, MD;  Location: [facility];  Service: Urology;  Laterality: Left;    Home Medications:  Allergies as of 12/29/2021       Reactions   Bee Venom Anaphylaxis   Fire Ant Anaphylaxis   Fire Ant (solenopsis [place]) Allergy Skin Test Anaphylaxis   Gramineae Pollens Anaphylaxis   Aspirin Itching   Bystolic [nebivolol Hcl]    Low HR, forgetful    Onion Itching   Versed [midazolam] Other (See Comments)   "Excessive memory loss for about six months"        Medication List        Accurate as of December 29, 2021 10:07 AM. If you have any questions, ask your nurse or doctor.          ATHLETES FOOT EX Apply 1 spray topically daily as needed (atheletes foot).   hydrALAZINE 25 MG tablet Commonly known as: APRESOLINE Take 1 tablet (25 mg total) by mouth in the morning and at bedtime.   hydrocortisone cream 1 % Apply 1 application topically daily as needed for itching.        Allergies:  Allergies  Allergen Reactions   Bee Venom Anaphylaxis   Fire Ant Anaphylaxis   Animator (Solenopsis [place]) Allergy Skin Test Anaphylaxis   Gramineae Pollens Anaphylaxis   Aspirin Itching   Bystolic [Nebivolol Hcl]     Low HR, forgetful    Onion Itching   Versed [Midazolam] Other (See Comments)    "Excessive memory loss for about six months"  Family History: Family History  Problem Relation Age of Onset   Hypertension Father    Hypertension Sister    Hypertension Sister     Social History:  reports that he has never smoked. He has never used smokeless tobacco. He reports that he does not drink alcohol and does not use drugs.  ROS: All other review of systems were reviewed and are negative except what is noted above in HPI  Physical Exam: Wt 202 lb (91.6 kg)    BMI 28.17 kg/m   Constitutional:  Alert and oriented, No acute distress. HEENT: [place] AT, moist mucus membranes.  Trachea  midline, no masses. Cardiovascular: No clubbing, cyanosis, or edema. Respiratory: Normal respiratory effort, no increased work of breathing. GI: Abdomen is soft, nontender, nondistended, no abdominal masses GU: No CVA tenderness.  Lymph: No cervical or inguinal lymphadenopathy. Skin: No rashes, bruises or suspicious lesions. Neurologic: Grossly intact, no focal deficits, moving all 4 extremities. Psychiatric: Normal mood and affect.  Laboratory Data: Lab Results  Component Value Date   WBC 6.5 04/27/2020   HGB 12.8 (L) 04/27/2020   HCT 40.3 04/27/2020   MCV 66.0 (L) 04/27/2020   PLT 182 04/27/2020    Lab Results  Component Value Date   CREATININE 1.21 04/27/2020    Lab Results  Component Value Date   PSA 0.8 04/04/2017    No results found for: TESTOSTERONE  No results found for: HGBA1C  Urinalysis    Component Value Date/Time   COLORURINE STRAW (A) 04/27/2020 1835   APPEARANCEUR Clear 02/01/2021 1025   LABSPEC >1.046 (H) 04/27/2020 1835   PHURINE 5.0 04/27/2020 1835   GLUCOSEU Negative 02/01/2021 1025   GLUCOSEU NEGATIVE 11/06/2017 [place] 04/27/2020 1835   BILIRUBINUR Negative 02/01/2021 1025   [place] 04/27/2020 1835   PROTEINUR Negative 02/01/2021 1025   PROTEINUR NEGATIVE 04/27/2020 1835   UROBILINOGEN 0.2 11/06/2017 1151   NITRITE Negative 02/01/2021 1025   NITRITE NEGATIVE 04/27/2020 1835   LEUKOCYTESUR Negative 02/01/2021 1025   LEUKOCYTESUR NEGATIVE 04/27/2020 1835    Lab Results  Component Value Date   LABMICR Comment 02/01/2021   WBCUA None seen 10/21/2020   LABEPIT None seen 10/21/2020   MUCUS pos 06/28/2012   BACTERIA None seen 10/21/2020    Pertinent Imaging: KUB today: Images reviewed and discussed with the patient  Results for orders placed during the hospital encounter of 01/31/21  Abdomen 1 view (KUB)  Narrative CLINICAL DATA:  Right-sided abdominal pain  EXAM: ABDOMEN - 1 VIEW  COMPARISON:  Mar 24, 2020  FINDINGS: Pelvic calcifications appear stable, consistent with phleboliths. No other abnormal calcifications are evident. There is moderate stool in the colon. There is no bowel dilatation or air-fluid level to suggest bowel obstruction. No free air.  IMPRESSION: Apparent phleboliths in the pelvis, not appreciably changed from prior study. No bowel obstruction or free air.   Electronically Signed By: Lowella Grip III M.D. On: 02/01/2021 10:51  No results found for this or any previous visit.  No results found for this or any previous visit.  No results found for this or any previous visit.  Results for orders placed during the hospital encounter of 01/31/21  Ultrasound renal complete  Narrative CLINICAL DATA:  Flank pain  EXAM: RENAL / URINARY TRACT ULTRASOUND COMPLETE  COMPARISON:  None.  FINDINGS: Right Kidney:  Renal measurements: 12.2 x 6.0 x 5.6 cm = volume: 216.4 mL. Echogenicity and renal cortical thickness are within normal limits. No  perinephric fluid or hydronephrosis visualized. There is a cyst in the lower pole left kidney measuring 1.5 x 1.7 x 1.7 cm. There is a cyst in the mid right kidney measuring 3.2 x 3.2 x 3.2 cm. No sonographically demonstrable calculus or ureterectasis.  Left Kidney:  Renal measurements: 11.2 x 5.4 x 5.9 cm = volume: 189.0 mL. Echogenicity and renal cortical thickness are within normal limits. No perinephric fluid or hydronephrosis visualized. There is a cyst in the lower pole left kidney measuring 2.1 x 1.1 x 1.0 cm. There is a cyst in the mid left kidney measuring 4.2 x 4.2 x 3.4 cm. There is a nonobstructing calculus in the lower pole left kidney measuring 6 mm. No ureterectasis.  Bladder:  No urinary bladder wall thickening. Mild debris noted within the urinary bladder.  Other:  Prostate measures 6.3 x 5.0 x 3.3 cm with areas of suspected prostatic calcification and mild inhomogeneity to the  echotexture.  IMPRESSION: 1.  There are renal cysts bilaterally.  2.  Nonobstructing 6 mm calculus lower pole left kidney.  3. No obstructing focus in either kidney. No hydronephrosis on either side.  4. Mild debris within the bladder. Correlation with urinalysis may be advisable given this appearance. Urinary bladder wall does not appear thickened.  5. Prominent prostate which may warrant correlation with clinical assessment and PSA evaluation.   Electronically Signed By: Lowella Grip III M.D. On: 02/01/2021 10:55  No results found for this or any previous visit.  No results found for this or any previous visit.  Results for orders placed in visit on 02/01/21  CT RENAL STONE STUDY  Narrative CLINICAL DATA:  Right flank pain since 01/25/2021 with chills. History of renal calculi.  EXAM: CT ABDOMEN AND PELVIS WITHOUT CONTRAST  TECHNIQUE: Multidetector CT imaging of the abdomen and pelvis was performed following the standard protocol without IV contrast.  COMPARISON:  Multiple exams, including renal ultrasound from 01/31/2021  FINDINGS: Lower chest: Mild descending thoracic aortic atherosclerotic calcification.  Hepatobiliary: Unremarkable  Pancreas: Unremarkable  Spleen: Unremarkable  Adrenals/Urinary Tract: Both adrenal glands appear normal. Fluid density 3.4 by 3.2 cm right mid kidney cyst, image 27 series 2. Fluid density 3.3 by 3.5 cm left mid kidney cyst anteriorly on image 24 series 2. Hypodense 1.5 by 1.6 cm lesion in the right kidney lower pole anteriorly has fluid density favoring a cyst, although faintly indistinct margins suggesting some minimal complexity which was also suggested by the sonographic appearance on yesterday's ultrasound. Strictly speaking a Bosniak classification cannot be assigned due to the lack of IV contrast, but this is most likely to be a complex cyst.  2 mm right kidney lower pole nonobstructive renal  calculus.  There are bed 8 nonobstructive left renal calculi, the larger calculi include a 5 mm lower pole calculus on image 58 series 5 and a 5 mm upper pole calculus on image 63 series 5.  No hydronephrosis or hydroureter. No ureteral or bladder calculi noted.  Stomach/Bowel: Unremarkable  Vascular/Lymphatic: Aortoiliac atherosclerotic vascular disease.  Reproductive: Unremarkable  Other: No supplemental non-categorized findings.  Musculoskeletal: Chronic sclerosis in the left proximal femoral metaphysis on image 84 of series 2 is unchanged from 2016 and considered benign.  IMPRESSION: 1. Bilateral nonobstructive nephrolithiasis. 2. Bilateral renal cysts. Probable mild complexity of the right kidney lower pole hypodense cystic lesion measuring 1.6 cm in long axis. 3. Aortic atherosclerosis.  Aortic Atherosclerosis (ICD10-I70.0).   Electronically Signed By: Van Clines M.D. On: 02/01/2021 11:39  Assessment & Plan:    1. Nephrolithiasis -We discussed the management of kidney stones. These options include observation, ureteroscopy, shockwave lithotripsy (ESWL) and percutaneous nephrolithotomy (PCNL). We discussed which options are relevant to the patient's stone(s). We discussed the natural history of kidney stones as well as the complications of untreated stones and the impact on quality of life without treatment as well as with each of the above listed treatments. We also discussed the efficacy of each treatment in its ability to clear the stone burden. With any of these management options I discussed the signs and symptoms of infection and the need for emergent treatment should these be experienced. For each option we discussed the ability of each procedure to clear the patient of their stone burden.   For observation I described the risks which include but are not limited to silent renal damage, life-threatening infection, need for emergent surgery, failure to pass  stone and pain.   For ureteroscopy I described the risks which include bleeding, infection, damage to contiguous structures, positioning injury, ureteral stricture, ureteral avulsion, ureteral injury, need for prolonged ureteral stent, inability to perform ureteroscopy, need for an interval procedure, inability to clear stone burden, stent discomfort/pain, heart attack, stroke, pulmonary embolus and the inherent risks with general anesthesia.   For shockwave lithotripsy I described the risks which include arrhythmia, kidney contusion, kidney hemorrhage, need for transfusion, pain, inability to adequately break up stone, inability to pass stone fragments, Steinstrasse, infection associated with obstructing stones, need for alternate surgical procedure, need for repeat shockwave lithotripsy, MI, CVA, PE and the inherent risks with anesthesia/conscious sedation.   For PCNL I described the risks including positioning injury, pneumothorax, hydrothorax, need for chest tube, inability to clear stone burden, renal laceration, arterial venous fistula or malformation, need for embolization of kidney, loss of kidney or renal function, need for repeat procedure, need for prolonged nephrostomy tube, ureteral avulsion, MI, CVA, PE and the inherent risks of general anesthesia.   - The patient would like to proceed with medical expulsive therapy. Rx of flomax, zofran given    No follow-ups on file.  Nicolette Bang, MD  [facility] [place]

## 2021-12-29 NOTE — Patient Instructions (Signed)
Dietary Guidelines to Help Prevent Kidney Stones Kidney stones are deposits of minerals and salts that form inside your kidneys. Your risk of developing kidney stones may be greater depending on your diet, your lifestyle, the medicines you take, and whether you have certain medical conditions. Most people can lower their chances of developing kidney stones by following the instructions below. Your dietitian may give you more specific instructions depending on your overall health and the type of kidney stones you tend to develop. What are tips for following this plan? Reading food labels  Choose foods with "no salt added" or "low-salt" labels. Limit your salt (sodium) intake to less than 1,500 mg a day. Choose foods with calcium for each meal and snack. Try to eat about 300 mg of calcium at each meal. Foods that contain 200-500 mg of calcium a serving include: 8 oz (237 mL) of milk, calcium-fortifiednon-dairy milk, and calcium-fortifiedfruit juice. Calcium-fortified means that calcium has been added to these drinks. 8 oz (237 mL) of kefir, yogurt, and soy yogurt. 4 oz (114 g) of tofu. 1 oz (28 g) of cheese. 1 cup (150 g) of dried figs. 1 cup (91 g) of cooked broccoli. One 3 oz (85 g) can of sardines or mackerel. Most people need 1,000-1,500 mg of calcium a day. Talk to your dietitian about how much calcium is recommended for you. Shopping Buy plenty of fresh fruits and vegetables. Most people do not need to avoid fruits and vegetables, even if these foods contain nutrients that may contribute to kidney stones. When shopping for convenience foods, choose: Whole pieces of fruit. Pre-made salads with dressing on the side. Low-fat fruit and yogurt smoothies. Avoid buying frozen meals or prepared deli foods. These can be high in sodium. Look for foods with live cultures, such as yogurt and kefir. Choose high-fiber grains, such as whole-wheat breads, oat bran, and wheat cereals. Cooking Do not add  salt to food when cooking. Place a salt shaker on the table and allow each person to add his or her own salt to taste. Use vegetable protein, such as beans, textured vegetable protein (TVP), or tofu, instead of meat in pasta, casseroles, and soups. Meal planning Eat less salt, if told by your dietitian. To do this: Avoid eating processed or pre-made food. Avoid eating fast food. Eat less animal protein, including cheese, meat, poultry, or fish, if told by your dietitian. To do this: Limit the number of times you have meat, poultry, fish, or cheese each week. Eat a diet free of meat at least 2 days a week. Eat only one serving each day of meat, poultry, fish, or seafood. When you prepare animal protein, cut pieces into small portion sizes. For most meat and fish, one serving is about the size of the palm of your hand. Eat at least five servings of fresh fruits and vegetables each day. To do this: Keep fruits and vegetables on hand for snacks. Eat one piece of fruit or a handful of berries with breakfast. Have a salad and fruit at lunch. Have two kinds of vegetables at dinner. Limit foods that are high in a substance called oxalate. These include: Spinach (cooked), rhubarb, beets, sweet potatoes, and Swiss chard. Peanuts. Potato chips, french fries, and baked potatoes with skin on. Nuts and nut products. Chocolate. If you regularly take a diuretic medicine, make sure to eat at least 1 or 2 servings of fruits or vegetables that are high in potassium each day. These include: Avocado. Banana. Orange, prune,   carrot, or tomato juice. Baked potato. Cabbage. Beans and split peas. Lifestyle  Drink enough fluid to keep your urine pale yellow. This is the most important thing you can do. Spread your fluid intake throughout the day. If you drink alcohol: Limit how much you use to: 0-1 drink a day for women who are not pregnant. 0-2 drinks a day for men. Be aware of how much alcohol is in your  drink. In the [place] one drink equals one 12 oz bottle of beer (355 mL), one 5 oz glass of wine (148 mL), or one 1 oz glass of hard liquor (44 mL). Lose weight if told by your health care provider. Work with your dietitian to find an eating plan and weight loss strategies that work best for you. General information Talk to your health care provider and dietitian about taking daily supplements. You may be told the following depending on your health and the cause of your kidney stones: Not to take supplements with vitamin C. To take a calcium supplement. To take a daily probiotic supplement. To take other supplements such as magnesium, fish oil, or vitamin B6. Take over-the-counter and prescription medicines only as told by your health care provider. These include supplements. What foods should I limit? Limit your intake of the following foods, or eat them as told by your dietitian. Vegetables Spinach. Rhubarb. Beets. Canned vegetables. Pickles. Olives. Baked potatoes with skin. Grains Wheat bran. Baked goods. Salted crackers. Cereals high in sugar. Meats and other proteins Nuts. Nut butters. Large portions of meat, poultry, or fish. Salted, precooked, or cured meats, such as sausages, meat loaves, and hot dogs. Dairy Cheese. Beverages Regular soft drinks. Regular vegetable juice. Seasonings and condiments Seasoning blends with salt. Salad dressings. Soy sauce. Ketchup. Barbecue sauce. Other foods Canned soups. Canned pasta sauce. Casseroles. Pizza. Lasagna. Frozen meals. Potato chips. French fries. The items listed above may not be a complete list of foods and beverages you should limit. Contact a dietitian for more information. What foods should I avoid? Talk to your dietitian about specific foods you should avoid based on the type of kidney stones you have and your overall health. Fruits Grapefruit. The item listed above may not be a complete list of foods and beverages you should  avoid. Contact a dietitian for more information. Summary Kidney stones are deposits of minerals and salts that form inside your kidneys. You can lower your risk of kidney stones by making changes to your diet. The most important thing you can do is drink enough fluid. Drink enough fluid to keep your urine pale yellow. Talk to your dietitian about how much calcium you should have each day, and eat less salt and animal protein as told by your dietitian. This information is not intended to replace advice given to you by your health care provider. Make sure you discuss any questions you have with your health care provider. Document Revised: 10/29/2019 Document Reviewed: 10/29/2019 [employer] Patient Education  2022 [employer]  

## 2021-12-30 LAB — CBC WITH DIFFERENTIAL/PLATELET
Basophils Absolute: 0 10*3/uL (ref 0.0–0.2)
Basos: 1 %
EOS (ABSOLUTE): 0.1 10*3/uL (ref 0.0–0.4)
Eos: 2 %
Hematocrit: 39.9 % (ref 37.5–51.0)
Hemoglobin: 12.5 g/dL — ABNORMAL LOW (ref 13.0–17.7)
Immature Grans (Abs): 0 10*3/uL (ref 0.0–0.1)
Immature Granulocytes: 0 %
Lymphocytes Absolute: 2 10*3/uL (ref 0.7–3.1)
Lymphs: 43 %
MCH: 20.8 pg — ABNORMAL LOW (ref 26.6–33.0)
MCHC: 31.3 g/dL — ABNORMAL LOW (ref 31.5–35.7)
MCV: 66 fL — ABNORMAL LOW (ref 79–97)
Monocytes Absolute: 0.4 10*3/uL (ref 0.1–0.9)
Monocytes: 7 %
Neutrophils Absolute: 2.3 10*3/uL (ref 1.4–7.0)
Neutrophils: 47 %
Platelets: 179 10*3/uL (ref 150–450)
RBC: 6.01 x10E6/uL — ABNORMAL HIGH (ref 4.14–5.80)
RDW: 18.2 % — ABNORMAL HIGH (ref 11.6–15.4)
WBC: 4.8 10*3/uL (ref 3.4–10.8)

## 2021-12-30 LAB — PSA: Prostate Specific Ag, Serum: 0.8 ng/mL (ref 0.0–4.0)

## 2022-01-12 ENCOUNTER — Ambulatory Visit: Payer: Managed Care, Other (non HMO) | Admitting: Physician Assistant

## 2022-01-17 ENCOUNTER — Ambulatory Visit: Payer: Managed Care, Other (non HMO) | Admitting: Urology

## 2022-01-17 ENCOUNTER — Telehealth: Payer: Self-pay

## 2022-01-17 NOTE — Telephone Encounter (Signed)
Yes, that will be fine.

## 2022-01-17 NOTE — Telephone Encounter (Signed)
Patient is very sick.  Has had his KUB done. ?But cannot come to the visit today.  He rescheduled.  Will this be ok to wait for next appt on March 21? ? ?Please advise. ? ?Thanks, ?Helene Kelp ?

## 2022-02-06 ENCOUNTER — Ambulatory Visit: Payer: Managed Care, Other (non HMO) | Admitting: Urology

## 2022-03-05 ENCOUNTER — Ambulatory Visit: Payer: Managed Care, Other (non HMO) | Admitting: Urology

## 2022-04-02 ENCOUNTER — Ambulatory Visit: Payer: Managed Care, Other (non HMO) | Admitting: Urology

## 2022-04-19 ENCOUNTER — Ambulatory Visit (HOSPITAL_BASED_OUTPATIENT_CLINIC_OR_DEPARTMENT_OTHER): Payer: 59 | Admitting: "Endocrinology

## 2022-04-25 ENCOUNTER — Ambulatory Visit: Payer: Managed Care, Other (non HMO) | Admitting: Urology

## 2022-05-03 ENCOUNTER — Encounter (HOSPITAL_BASED_OUTPATIENT_CLINIC_OR_DEPARTMENT_OTHER): Payer: Self-pay | Admitting: *Deleted

## 2022-05-03 ENCOUNTER — Other Ambulatory Visit: Payer: Self-pay

## 2022-05-03 ENCOUNTER — Emergency Department (HOSPITAL_BASED_OUTPATIENT_CLINIC_OR_DEPARTMENT_OTHER): Payer: Commercial Managed Care - HMO

## 2022-05-03 ENCOUNTER — Emergency Department (HOSPITAL_BASED_OUTPATIENT_CLINIC_OR_DEPARTMENT_OTHER)
Admission: EM | Admit: 2022-05-03 | Discharge: 2022-05-03 | Disposition: A | Discharge status: Home / Self Care | Payer: Commercial Managed Care - HMO | Attending: Emergency Medicine | Admitting: Emergency Medicine

## 2022-05-03 DIAGNOSIS — R519 Headache, unspecified: Secondary | ICD-10-CM | POA: Insufficient documentation

## 2022-05-03 DIAGNOSIS — M545 Low back pain, unspecified: Secondary | ICD-10-CM | POA: Diagnosis not present

## 2022-05-03 DIAGNOSIS — R1031 Right lower quadrant pain: Secondary | ICD-10-CM | POA: Insufficient documentation

## 2022-05-03 DIAGNOSIS — I10 Essential (primary) hypertension: Secondary | ICD-10-CM | POA: Insufficient documentation

## 2022-05-03 DIAGNOSIS — Z79899 Other long term (current) drug therapy: Secondary | ICD-10-CM | POA: Diagnosis not present

## 2022-05-03 DIAGNOSIS — R109 Unspecified abdominal pain: Secondary | ICD-10-CM

## 2022-05-03 DIAGNOSIS — I160 Hypertensive urgency: Secondary | ICD-10-CM | POA: Diagnosis not present

## 2022-05-03 LAB — URINALYSIS, ROUTINE W REFLEX MICROSCOPIC
Bilirubin Urine: NEGATIVE
Glucose, UA: NEGATIVE mg/dL
Hgb urine dipstick: NEGATIVE
Ketones, ur: NEGATIVE mg/dL
Leukocytes,Ua: NEGATIVE
Nitrite: NEGATIVE
Protein, ur: NEGATIVE mg/dL
Specific Gravity, Urine: <1.005 — ABNORMAL LOW (ref 1.005–1.030)
pH: 5.5 (ref 5.0–8.0)

## 2022-05-03 LAB — CBC
HCT: 41.9 % (ref 39.0–52.0)
Hemoglobin: 13 g/dL (ref 13.0–17.0)
MCH: 20.4 pg — ABNORMAL LOW (ref 26.0–34.0)
MCHC: 31 g/dL (ref 30.0–36.0)
MCV: 65.8 fL — ABNORMAL LOW (ref 80.0–100.0)
Platelets: 189 10*3/uL (ref 150–400)
RBC: 6.37 MIL/uL — ABNORMAL HIGH (ref 4.22–5.81)
RDW: 17.2 % — ABNORMAL HIGH (ref 11.5–15.5)
WBC: 6.2 10*3/uL (ref 4.0–10.5)
nRBC: 0 % (ref 0.0–0.2)

## 2022-05-03 LAB — COMPREHENSIVE METABOLIC PANEL
ALT: 19 U/L (ref 0–44)
AST: 21 U/L (ref 15–41)
Albumin: 5.1 g/dL — ABNORMAL HIGH (ref 3.5–5.0)
Alkaline Phosphatase: 55 U/L (ref 38–126)
Anion gap: 10 (ref 5–15)
BUN: 21 mg/dL (ref 8–23)
CO2: 23 mmol/L (ref 22–32)
Calcium: 10.4 mg/dL — ABNORMAL HIGH (ref 8.9–10.3)
Chloride: 105 mmol/L (ref 98–111)
Creatinine, Ser: 1.51 mg/dL — ABNORMAL HIGH (ref 0.61–1.24)
GFR, Estimated: 52 mL/min — ABNORMAL LOW (ref >60)
Glucose, Bld: 99 mg/dL (ref 70–99)
Potassium: 4.2 mmol/L (ref 3.5–5.1)
Sodium: 138 mmol/L (ref 135–145)
Total Bilirubin: 0.6 mg/dL (ref 0.3–1.2)
Total Protein: 8.4 g/dL — ABNORMAL HIGH (ref 6.5–8.1)

## 2022-05-03 LAB — LIPASE, BLOOD: Lipase: 20 U/L (ref 11–51)

## 2022-05-03 MED ORDER — AMLODIPINE BESYLATE 5 MG PO TABS
5.0000 mg | ORAL_TABLET | Freq: Every day | ORAL | 0 refills | Status: DC
Start: 2022-05-03 — End: 2022-05-14

## 2022-05-03 MED ORDER — IOHEXOL 350 MG/ML SOLN
100.0000 mL | Freq: Once | INTRAVENOUS | Status: AC | PRN
  Administered 2022-05-03: 100 mL via INTRAVENOUS

## 2022-05-03 MED ORDER — SODIUM CHLORIDE 0.9 % IV BOLUS
500.0000 mL | Freq: Once | INTRAVENOUS | Status: AC
  Administered 2022-05-03: 500 mL via INTRAVENOUS

## 2022-05-03 MED ORDER — AMLODIPINE BESYLATE 5 MG PO TABS
5.0000 mg | ORAL_TABLET | Freq: Once | ORAL | Status: AC
  Administered 2022-05-03: 5 mg via ORAL
  Filled 2022-05-03: qty 1

## 2022-05-03 MED ORDER — MORPHINE SULFATE (PF) 4 MG/ML IV SOLN
4.0000 mg | Freq: Once | INTRAVENOUS | Status: DC
  Filled 2022-05-03: qty 1

## 2022-05-03 NOTE — ED Triage Notes (Signed)
Pt here for right RUQ pain with some right flank pain.  Pt has hx of kidney stones.   Pt is nervous to be here and states that he does not want to have BP checked due to white coat (he is aware that I have to chart "refused")

## 2022-05-03 NOTE — Discharge Instructions (Addendum)
I prescribed Norvasc to take at home.  Take 1 tablet daily.  Is a 30-day supply so close follow-up with primary care provider is recommended.  I provided information called [facility].  It is good number to contact regarding setting up a primary care provider.  Your blood pressure is elevated at a concerning level and can cause long-term health problems if it stays elevated this high.  It is of utmost important for you to get into a primary care provider to get your blood pressure control.  Please do not hesitate to return to the emergency department if the worrisome signs and symptoms we discussed become apparent.

## 2022-05-03 NOTE — ED Notes (Signed)
ED Provider at bedside. 

## 2022-05-03 NOTE — ED Provider Notes (Signed)
[facility] EMERGENCY DEPT Provider Note   CSN: [id number] Arrival date & time: 05/03/22  8588     History  Chief Complaint  Patient presents with   Abdominal Pain    Alan Shepherd is a 61 y.o. male.   Abdominal Pain Associated symptoms: no chest pain, no chills, no cough, no dysuria, no fever, no hematuria, no shortness of breath, no sore throat and no vomiting    61 year old male presents emergency department with with complaints of abdominal pain.  Patient states that symptoms have been present for the past 2 weeks.  This morning his pain began to worsen and he also noticed some radiation to his right lower back.  Described right lower back pain as "pulsating."  He has a history of recurrent nephrolithiasis but states this pain is different.  Denies association with food or accompanying nausea/vomiting.  Denies association with physical activity.  Occupation includes truck Hospital doctor.  He notes he has a history of "whitecoat hypertension" and states that his blood pressure is always elevated.  He is denied taking blood pressure medication from a previous provider because it caused impotence.  He is not currently on any blood pressure medication.  He also notes history of thoracic aortic aneurysm. Denies fever, chills, night sweats, chest pain, shortness of breath, N/V/D, urinary symptoms, change in bowel habits.  Past Medical History:  Diagnosis Date   Aneurysm of ascending aorta (HCC) 12/14/2020   Complication of anesthesia    excessive memory loss with versed   Environmental allergies    Family history of premature coronary artery disease    Hematuria    History of exercise intolerance    11-10-2013   ETT normal    History of kidney stones    Hypertension    primary cardiolgoist-  dr Eden Emms (lov in epic 12-15-2015)---  11-10-2013 ETT normal    Nephrolithiasis    per pt CT at dr Ronne Binning showed bilateral renal stones   Renal calculus, left    White coat syndrome  with hypertension    Past Surgical History:  Procedure Laterality Date   CYSTOSCOPY W/ URETERAL STENT PLACEMENT Left 10/04/2017   Procedure: CYSTOSCOPY WITH LEFT  RETROGRADE PYELOGRAM/URETERAL STENT PLACEMENT;  Surgeon: Ihor Gully, MD;  Location: [facility];  Service: Urology;  Laterality: Left;   CYSTOSCOPY WITH RETROGRADE PYELOGRAM, URETEROSCOPY AND STENT PLACEMENT Left 12/09/2017   Procedure: CYSTOSCOPY WITH RETROGRADE PYELOGRAM, URETEROSCOPY AND STENT EXCHANGE, BASKET STONE RETRIVAL;  Surgeon: Malen Gauze, MD;  Location: [facility];  Service: Urology;  Laterality: Left;   CYSTOSCOPY/RETROGRADE/URETEROSCOPY/STONE EXTRACTION WITH BASKET Bilateral left 04-06-2003;  right 10-08-2007   dr Stark Bray  [facility]   HOLMIUM LASER APPLICATION Left 12/09/2017   Procedure: HOLMIUM LASER APPLICATION;  Surgeon: Malen Gauze, MD;  Location: [facility];  Service: Urology;  Laterality: Left;    Home Medications Prior to Admission medications   Medication Sig Start Date End Date Taking? Authorizing Provider  amLODipine (NORVASC) 5 MG tablet Take 1 tablet (5 mg total) by mouth daily. 05/03/22  Yes Sherian Maroon A, PA  hydrALAZINE (APRESOLINE) 25 MG tablet Take 1 tablet (25 mg total) by mouth in the morning and at bedtime. 01/30/21 04/30/21  Chilton Si, MD  hydrocortisone cream 1 % Apply 1 application topically daily as needed for itching.    [provider]  Miconazole Nitrate (ATHLETES FOOT EX) Apply 1 spray topically daily as needed (atheletes foot).    [provider]  ondansetron (  ZOFRAN) 4 MG tablet Take 1 tablet (4 mg total) by mouth every 8 (eight) hours as needed for nausea or vomiting. 12/29/21   [name], Mardene Celeste, MD  oxyCODONE-acetaminophen (PERCOCET) 5-325 MG tablet Take 1 tablet by mouth every 4 (four) hours as needed. 12/29/21   [name], Mardene Celeste, MD  tamsulosin (FLOMAX) 0.4 MG CAPS capsule Take 1 capsule (0.4 mg total) by mouth  daily. 12/29/21   [name], Mardene Celeste, MD      Allergies    Bee venom, Fire ant, Fire ant (solenopsis [place]) allergy skin test, Gramineae pollens, Aspirin, Bystolic [nebivolol hcl], Onion, and Versed [midazolam]    Review of Systems   Review of Systems  Constitutional:  Negative for chills and fever.  HENT:  Negative for ear pain and sore throat.   Eyes:  Negative for pain and visual disturbance.  Respiratory:  Negative for cough and shortness of breath.   Cardiovascular:  Negative for chest pain and palpitations.  Gastrointestinal:  Positive for abdominal pain. Negative for vomiting.  Genitourinary:  Negative for dysuria and hematuria.  Musculoskeletal:  Positive for back pain. Negative for arthralgias.  Skin:  Negative for color change and rash.  Neurological:  Negative for seizures and syncope.  All other systems reviewed and are negative.   Physical Exam Updated Vital Signs BP (!) 190/110   Pulse 76   Temp 98.5 F (36.9 C) (Oral)   Resp (!) 21   SpO2 95%  Physical Exam Vitals and nursing note reviewed.  Constitutional:      General: He is not in acute distress.    Appearance: He is well-developed. He is not ill-appearing.  HENT:     Head: Normocephalic and atraumatic.     Mouth/Throat:     Mouth: Mucous membranes are moist.     Pharynx: Oropharynx is clear.  Eyes:     Extraocular Movements: Extraocular movements intact.     Conjunctiva/sclera: Conjunctivae normal.     Pupils: Pupils are equal, round, and reactive to light.  Cardiovascular:     Rate and Rhythm: Normal rate and regular rhythm.     Heart sounds: Normal heart sounds. No murmur heard. Pulmonary:     Effort: Pulmonary effort is normal. No respiratory distress.     Breath sounds: Normal breath sounds.  Abdominal:     General: Abdomen is flat. Bowel sounds are normal.     Palpations: Abdomen is soft.     Tenderness: There is no abdominal tenderness.     Comments: Minimal right lower quadrant  discomfort upon palpation.  No midline tenderness of lumbar spine.  No right or left-sided CVA tenderness.  No overlying skin abnormality noted.  McBurney sign negative.  Murphy sign negative.  Rebound negative.  Musculoskeletal:        General: No swelling.     Cervical back: Neck supple.  Skin:    General: Skin is warm and dry.     Capillary Refill: Capillary refill takes less than 2 seconds.  Neurological:     General: No focal deficit present.     Mental Status: He is alert and oriented to person, place, and time.  Psychiatric:        Mood and Affect: Mood normal.     ED Results / Procedures / Treatments   Labs (all labs ordered are listed, but only abnormal results are displayed) Labs Reviewed  COMPREHENSIVE METABOLIC PANEL - Abnormal; Notable for the following components:      Result Value  Creatinine, Ser 1.51 (*)    Calcium 10.4 (*)    Total Protein 8.4 (*)    Albumin 5.1 (*)    GFR, Estimated 52 (*)    All other components within normal limits  CBC - Abnormal; Notable for the following components:   RBC 6.37 (*)    MCV 65.8 (*)    MCH 20.4 (*)    RDW 17.2 (*)    All other components within normal limits  URINALYSIS, ROUTINE W REFLEX MICROSCOPIC - Abnormal; Notable for the following components:   Color, Urine COLORLESS (*)    Specific Gravity, Urine <1.005 (*)    All other components within normal limits  LIPASE, BLOOD    EKG EKG Interpretation  Date/Time:  Thursday May 03 2022 16:22:33 EDT Ventricular Rate:  80 PR Interval:  170 QRS Duration: 92 QT Interval:  384 QTC Calculation: 442 R Axis:   -7 Text Interpretation: Sinus rhythm with occasional Premature ventricular complexes Possible Left atrial enlargement similar to 2021 Confirmed by Pricilla Loveless [id number]) on 05/03/2022 7:34:38 PM  Radiology CT Head Wo Contrast  Result Date: 05/03/2022 CLINICAL DATA:  Headache, new or worsening (Age >= 50y) EXAM: CT HEAD WITHOUT CONTRAST TECHNIQUE: Contiguous  axial images were obtained from the base of the skull through the vertex without intravenous contrast. RADIATION DOSE REDUCTION: This exam was performed according to the departmental dose-optimization program which includes automated exposure control, adjustment of the mA and/or kV according to patient size and/or use of iterative reconstruction technique. COMPARISON:  04/27/2020 FINDINGS: Brain: No acute intracranial abnormality. Specifically, no hemorrhage, hydrocephalus, mass lesion, acute infarction, or significant intracranial injury. Vascular: No hyperdense vessel or unexpected calcification. Skull: No acute calvarial abnormality. Sinuses/Orbits: No acute findings Other: None IMPRESSION: Normal study. Electronically Signed   By: Charlett Nose M.D.   On: 05/03/2022 21:18   CT Angio Chest/Abd/Pel for Dissection W and/or Wo Contrast  Result Date: 05/03/2022 CLINICAL DATA:  Aortic aneurysm, known or suspected. Right upper quadrant and flank pain EXAM: CT ANGIOGRAPHY CHEST, ABDOMEN AND PELVIS TECHNIQUE: Non-contrast CT of the chest was initially obtained. Multidetector CT imaging through the chest, abdomen and pelvis was performed using the standard protocol during bolus administration of intravenous contrast. Multiplanar reconstructed images and MIPs were obtained and reviewed to evaluate the vascular anatomy. RADIATION DOSE REDUCTION: This exam was performed according to the departmental dose-optimization program which includes automated exposure control, adjustment of the mA and/or kV according to patient size and/or use of iterative reconstruction technique. CONTRAST:  OMNIPAQUE IOHEXOL 350 MG/ML SOLN COMPARISON:  02/01/2021 FINDINGS: CTA CHEST FINDINGS Cardiovascular: Aneurysmal dilatation of the ascending thoracic aorta measuring up to 4.7 cm. No dissection. Scattered calcifications. Heart is normal size. Mediastinum/Nodes: No mediastinal, hilar, or axillary adenopathy. Trachea and esophagus are  unremarkable. Thyroid unremarkable. Lungs/Pleura: Lungs are clear. No focal airspace opacities or suspicious nodules. No effusions. Musculoskeletal: Chest wall soft tissues are unremarkable. No acute bony abnormality. Review of the MIP images confirms the above findings. CTA ABDOMEN AND PELVIS FINDINGS VASCULAR Aorta: Scattered aortic calcifications.  No aneurysm or dissection. Celiac: Patent without evidence of aneurysm, dissection, vasculitis or significant stenosis. SMA: Patent without evidence of aneurysm, dissection, vasculitis or significant stenosis. Renals: Both renal arteries are patent without evidence of aneurysm, dissection, vasculitis, fibromuscular dysplasia or significant stenosis. IMA: Patent without evidence of aneurysm, dissection, vasculitis or significant stenosis. Inflow: Patent without evidence of aneurysm, dissection, vasculitis or significant stenosis. Veins: No obvious venous abnormality within the limitations of this  arterial phase study. Review of the MIP images confirms the above findings. NON-VASCULAR Hepatobiliary: No focal hepatic abnormality. Gallbladder unremarkable. Pancreas: No focal abnormality or ductal dilatation. Spleen: No focal abnormality.  Normal size. Adrenals/Urinary Tract: Bilateral renal cysts are stable since prior study. No follow-up imaging recommended. 9 mm stone in the lower pole of the left kidney. No ureteral stones or hydronephrosis. Adrenal glands and urinary bladder unremarkable. Stomach/Bowel: Stomach, large and small bowel grossly unremarkable. Lymphatic: No adenopathy Reproductive: Mildly prominent prostate with central calcifications. Other: No free fluid or free air. Musculoskeletal: No acute bony abnormality. Review of the MIP images confirms the above findings. IMPRESSION: 4.7 cm ascending thoracic aortic aneurysm. No evidence of dissection. Recommend semi-annual imaging followup by CTA or MRA and referral to cardiothoracic surgery if not already  obtained. This recommendation follows 2010 ACCF/AHA/AATS/ACR/ASA/SCA/SCAI/SIR/STS/SVM Guidelines for the Diagnosis and Management of Patients With Thoracic Aortic Disease. Circulation. 2010; 121: W098-J191: E266-e369. Aortic aneurysm NOS (ICD10-I71.9) No acute cardiopulmonary disease. Left lower pole nephrolithiasis. No acute findings in the abdomen or pelvis. Electronically Signed   By: Charlett NoseKevin  Dover M.D.   On: 05/03/2022 21:09    Procedures Procedures    Medications Ordered in ED Medications  morphine (PF) 4 MG/ML injection 4 mg (4 mg Intravenous Patient Refused/Not Given 05/03/22 2008)  sodium chloride 0.9 % bolus 500 mL (0 mLs Intravenous Stopped 05/03/22 2221)  iohexol (OMNIPAQUE) 350 MG/ML injection 100 mL (100 mLs Intravenous Contrast Given 05/03/22 2042)  amLODipine (NORVASC) tablet 5 mg (5 mg Oral Given 05/03/22 2221)    ED Course/ Medical Decision Making/ A&P                           Medical Decision Making Amount and/or Complexity of Data Reviewed Labs: ordered. Radiology: ordered.  Risk Prescription drug management.   This patient presents to the ED for concern of abdominal pain, this involves an extensive number of treatment options, and is a complaint that carries with it a high risk of complications and morbidity.  The differential diagnosis includes The causes of generalized abdominal pain include but are not limited to AAA, mesenteric ischemia, appendicitis, diverticulitis, DKA, gastritis, gastroenteritis, AMI, nephrolithiasis, pancreatitis, peritonitis, adrenal insufficiency,lead poisoning, iron toxicity, intestinal ischemia, constipation, UTI,SBO/LBO, splenic rupture, biliary disease, IBD, IBS, PUD, or hepatitis.   Co morbidities that complicate the patient evaluation  Recurrent left, uncontrolled hypertension, aneurysm of ascending aorta, hyperlipidemia   Additional history obtained:  Additional history obtained from CT angio chest abdomen pelvis from 04/27/2020 External  records from outside source obtained and reviewed including noted prior ascending thoracic aortic aneurysm of 4.8 cm   Lab Tests:  I Ordered, and personally interpreted labs.  The pertinent results include: GFR 52 with creatinine 1.51 and BUN of 21.     Imaging Studies ordered:  I ordered imaging studies including CT head without contrast, CT angio chest abdomen pelvis I independently visualized and interpreted imaging which showed  CT head: No intracranial abnormality CT angio chest abdomen pelvis: 4.7 cm ascending thoracic aortic aneurysm.  No evidence of dissection.  No active cardiopulmonary disease, left lower pole nephrolithiasis of 9 mm with no obstruction/hydronephrosis.  No other acute findings in the abdomen or pelvis. I agree with the radiologist interpretation  Cardiac Monitoring: / EKG:  The patient was maintained on a cardiac monitor.  I personally viewed and interpreted the cardiac monitored which showed an underlying rhythm of: sinus rhythm    Consultations Obtained:  N/a  Problem List / ED Course / Critical interventions / Medication management  Abdominal pain I ordered medication including Norvasc  for HTN  Reevaluation of the patient after these medicines showed that the patient improved I have reviewed the patients home medicines and have made adjustments as needed   Social Determinants of Health:  Denies tobacco, alcohol, illicit drug use   Test / Admission - Considered:  HTN Emergency Vitals signs significant for hypertension with a blood pressure consistently elevated 200s/100s. Otherwise within normal range and stable throughout visit. Laboratory/imaging studies significant for: No acute abnormality Patient's symptoms most likely secondary to chronically elevated hypertension.  Patient has no signs of hypertensive emergency but certainly hypertensive emergency.  He given 1 dose of Norvasc while in the emergency department and prescribed a 30-day  supply.  We will also given information to set up an appointment with a PCP outpatient for further blood pressure management.  Doubt dissection, doubt appendicitis, doubt obstructing nephrolithiasis given negative CT studies. Worrisome signs and symptoms were discussed with the patient, and the patient acknowledged understanding to return to the ED if noticed. Patient was stable upon discharge.         Final Clinical Impression(s) / ED Diagnoses Final diagnoses:  Hypertensive urgency  Abdominal pain, unspecified abdominal location    Rx / DC Orders ED Discharge Orders          Ordered    amLODipine (NORVASC) 5 MG tablet  Daily        05/03/22 2218              Peter Garter, [place] 05/03/22 2234    Pricilla Loveless, MD 05/03/22 2315

## 2022-05-03 NOTE — ED Notes (Signed)
Provider aware of BP

## 2022-05-06 NOTE — Progress Notes (Unsigned)
Department of Endocrinology  Return Telephone Visit     Name/MRN: Ricardo Andrade, Ricardo Andrade [id number] Date of service: 05/07/2022   Age/DOB: 61 y.o., May 28, 1961 Chief Complaint: Hypothyroidism     TELEMEDICINE DOCUMENTATION:    Patient Location:  home address: [address]   [place] [address]    Patient/family aware of provider location:  yes  Patient/family consent for telemedicine:  yes  Examination observed and performed by:  N/A (telephone visit)    HISTORY OF PRESENTING ILLNESS:     Ricardo Andrade) is a 61 y.o. male with PMH of hypothyroidism, GERD, allergic rhinitis, hearing loss, and type II DM who presents for follow up of hypothyroidism secondary to Hashimoto's thyroiditis.    Pertinent History:  -Most recent thyroid labs:   Lab Results   Component Value Date    TSH 2.298 10/05/2020     -Prior known thyroid disease: Hypothyroidism diagnosed in 2021; positive TPO antibodies in 2014 and 2021 confirmed Hashimoto's thyroiditis  -History of thyroid cancer:  No  -History of radiation to the head or neck:  No  -History of thyroid surgery: No  -Taking any iodine supplementation: No  -Recent IV contrast administration (within past 6 months): No  -Recent upper respiratory or GI tract infection (within past 6 months): No  -Family history of thyroid disease:  Mother- hypothyroidism  -Family history of thyroid cancer:  No  -Family history of autoimmune diseases: No  -Biotin or herbal supplements: No biotin; vitamin D, vitamin B12, apple cider vinegar  -Steroid use (PO, injection, cream): No  -Taking Synthroid, Armour Thyroid, Nature Thyroid, Tirosint, any other thyroid med: Levothyroxine 25 mcg Monday-Friday and 50 mcg on Saturday/Sunday   -Takes thyroid medication on an empty stomach/first thing in the morning: Yes   -Missing thyroid medication doses: No    Symptoms:  -Fatigue/Energy level: Yes, frequent  -Mood changes: Stable   -Weight changes: Stable  -Tremors:  No  -Palpitations:  No  -Skin/Hair/Nail  changes: Skin dry    -Constipation or Diarrhea: Sometimes diarrhea if takes pills on empty stomach (thinks metformin)  -Hot/cold intolerance: No  -Sweating: No  -Increased neck size: No    -Shortness of breath, choking, difficulty swallowing: No  -Anterior neck pain:  No  -Voice changes:  No  -Dry or gritty sensation in the eyes: No  -Increased prominence of the eyes: No       MEDICAL HISTORY:   PAST MEDICAL & SURGICAL HISTORIES:   Past Medical History:   Diagnosis Date   . Allergic rhinitis    . GERD (gastroesophageal reflux disease)    . Hearing loss    . HTN    . Hypothyroidism    . Type II diabetes mellitus (CMS HCC)         Past Surgical History:   Procedure Laterality Date   . HX OTHER      Scrotal blood vessel removal for rupture   . HX TENDON REPAIR Right     Right arm             HOME MEDICATIONS:  Current Outpatient Medications   Medication Sig   . APPLE CIDER VINEGAR ORAL Take by mouth   . benazepril (LOTENSIN) 5 mg Oral Tablet Take 8 Tablets (40 mg total) by mouth Once a day   . cholecalciferol, vitamin D3, 25 mcg (1,000 unit) Oral Tablet Take 1 Tablet (1,000 Units total) by mouth Once a day   . cyanocobalamin (VITAMIN B 12) 1,000 mcg Oral Tablet  Take 2.5 Tablets (2,500 mcg total) by mouth Once a day   . lansoprazole (PREVACID SOLUTAB) 15 mg Oral Tablet,Rapid Dissolve, DR Take 1 Tablet (15 mg total) by mouth Once a day   . levothyroxine (SYNTHROID) 25 mcg Oral Tablet Take 1 Tablet (25 mcg total) by mouth Every morning And 50 mcg on Saturday and Sunday   . MetFORMIN (GLUCOPHAGE) 1,000 mg Oral Tablet Take 1 Tablet (1,000 mg total) by mouth Twice daily with food   . triamterene-hydroCHLOROthiazide (MAXZIDE) 75-50 mg Oral Tablet Take 1 Tablet by mouth Once a day         ALLERGIES:  No Known Allergies     FAMILY HISTORY:  Family Medical History:     Problem Relation (Age of Onset)    Breast Cancer Sister    Diabetes type II Mother, Brother    Heart Disease Brother    Hypertension (High Blood Pressure) Father     Hypothyroidism Mother          SOCIAL HISTORY:  Social History     Tobacco Use   . Smoking status: Never   . Smokeless tobacco: Former     Types: Snuff     Quit date: 1997   Substance Use Topics   . Alcohol use: Yes     Alcohol/week: 14.0 standard drinks     Types: 14 Cans of beer per week   . Drug use: Never   Works as a Emergency planning/management officer in [employer].  Lives in [place], [place] with his wife.     REVIEW OF SYSTEMS:     ROS: All systems reviewed and negative except for as above.    EXAMINATION:   Vitals: There were no vitals taken for this visit.   Unable to perform as encounter is virtual via telephone    DATA REVIEWED:   I have reviewed previous labs, tests, imaging, and notes.    Component   Ref Range & Units 12/18/2012   THYROPEROXIDASE (TPO) ANTIBODIES, SERUM   <51 IU/mL 120     Lab Results   Component Value Date    TSH 2.298 10/05/2020       ASSESSMENT & PLAN:     Orders Placed This Encounter   . THYROID STIMULATING HORMONE (SENSITIVE TSH)       Ricardo Andrade) is a 61 y.o. male with PMH of hypothyroidism, GERD, allergic rhinitis, hearing loss, and type II DM who presents for follow up of hypothyroidism secondary to Hashimoto's thyroiditis.    Hypothyroidism secondary to Hashimoto's Thyroiditis  -Will repeat TSH and adjust levothyroxine as needed. Continue levothyroxine 25 mcg Monday through Friday and 50 mcg on Saturday/Sunday for now     Return to clinic in 1 year with TSH levels in between visits    15 minutes of total time were spent on the day of the visit including 7 minutes on telephone with patient. This included records review, medication management, patient education, and documentation.     Ricardo Levan, MD, MPH 05/07/2022, 08:00

## 2022-05-07 ENCOUNTER — Other Ambulatory Visit: Payer: Self-pay

## 2022-05-07 ENCOUNTER — Encounter (HOSPITAL_BASED_OUTPATIENT_CLINIC_OR_DEPARTMENT_OTHER): Payer: Self-pay | Admitting: "Endocrinology

## 2022-05-07 ENCOUNTER — Ambulatory Visit: Payer: 59 | Attending: "Endocrinology | Admitting: "Endocrinology

## 2022-05-07 DIAGNOSIS — E063 Autoimmune thyroiditis: Secondary | ICD-10-CM | POA: Insufficient documentation

## 2022-05-07 DIAGNOSIS — E038 Other specified hypothyroidism: Secondary | ICD-10-CM | POA: Insufficient documentation

## 2022-05-10 ENCOUNTER — Telehealth: Payer: Self-pay

## 2022-05-10 NOTE — Telephone Encounter (Signed)
Patient called advising he had a full body scan done at [facility]. He wanted to know if he still needed to have his x ray done before appt Monday?

## 2022-05-14 ENCOUNTER — Ambulatory Visit: Payer: Commercial Managed Care - HMO | Admitting: Urology

## 2022-05-14 ENCOUNTER — Encounter: Payer: Self-pay | Admitting: Urology

## 2022-05-14 VITALS — BP 80/61 | HR 89

## 2022-05-14 DIAGNOSIS — N200 Calculus of kidney: Secondary | ICD-10-CM

## 2022-05-14 LAB — URINALYSIS, ROUTINE W REFLEX MICROSCOPIC
Bilirubin, UA: NEGATIVE
Glucose, UA: NEGATIVE
Ketones, UA: NEGATIVE
Leukocytes,UA: NEGATIVE
Nitrite, UA: NEGATIVE
Protein,UA: NEGATIVE
Specific Gravity, UA: 1.015 (ref 1.005–1.030)
Urobilinogen, Ur: 0.2 mg/dL (ref 0.2–1.0)
pH, UA: 5 (ref 5.0–7.5)

## 2022-05-14 LAB — MICROSCOPIC EXAMINATION
Epithelial Cells (non renal): NONE SEEN /hpf (ref 0–10)
Renal Epithel, UA: NONE SEEN /hpf
WBC, UA: NONE SEEN /hpf (ref 0–5)

## 2022-05-14 MED ORDER — AMLODIPINE BESYLATE 5 MG PO TABS: 5.0000 mg | ORAL_TABLET | Freq: Every day | ORAL | 0 refills | Status: DC

## 2022-05-28 ENCOUNTER — Ambulatory Visit (HOSPITAL_BASED_OUTPATIENT_CLINIC_OR_DEPARTMENT_OTHER): Payer: Self-pay | Admitting: "Endocrinology

## 2022-05-28 NOTE — Telephone Encounter (Addendum)
Regarding: needs orders to have labs done faxed to [facility] in [place]  ----- Message from Delaine Lame sent at 05/28/2022  3:57 PM EDT -----  Dr Drusilla Kanner pt    Pt calling and is supposed to get labs done but wants to get them done in [facility] in [place]. Please fax orders to them. Please call pt to let him know when completed so he can go [phone]        Faxed lab orders, called patient left voicemail.     Wonda Amis, RN

## 2022-06-05 ENCOUNTER — Other Ambulatory Visit (HOSPITAL_BASED_OUTPATIENT_CLINIC_OR_DEPARTMENT_OTHER): Payer: Self-pay | Admitting: "Endocrinology

## 2022-06-05 ENCOUNTER — Other Ambulatory Visit (HOSPITAL_BASED_OUTPATIENT_CLINIC_OR_DEPARTMENT_OTHER): Payer: Self-pay

## 2022-06-05 ENCOUNTER — Telehealth (HOSPITAL_BASED_OUTPATIENT_CLINIC_OR_DEPARTMENT_OTHER): Payer: Self-pay | Admitting: "Endocrinology

## 2022-06-05 DIAGNOSIS — E063 Autoimmune thyroiditis: Secondary | ICD-10-CM

## 2022-06-05 LAB — THYROID STIMULATING HORMONE (SENSITIVE TSH): TSH: 2.7

## 2022-06-05 NOTE — Telephone Encounter (Signed)
Placed letter in outgoing mail to patient's address on file.  Donnie Mesa, RN  06/05/2022, 12:26

## 2022-06-05 NOTE — Telephone Encounter (Signed)
Summary: \pt blood work order    Zadie Rhine, MD     Patients blood work orders need to be faxed to [phone]. this is [facility]. Thank you!  Patient is there now.                 Call History     Type Contact Phone/Fax User   06/05/2022 08:41 AM EDT Phone (Incoming) Yvonna Alanis [phone] Hezzie Bump   speak with [facility] registration 3     Faxed order. Received confirmation.  Donnie Mesa, RN  06/05/2022, 09:26

## 2022-06-05 NOTE — Telephone Encounter (Signed)
-----   Message from Zadie Rhine, MD sent at 06/05/2022 11:58 AM EDT -----  Nurses, please call and let patient know his thyroid level looks good so I recommend we continue his current dose of levothyroxine  25 mcg Monday-Friday and 50 mcg on Saturday/Sunday and I will order a repeat TSH for him to get done in 3-4 months.    Gordy Levan, MD, MPH 06/05/2022, 11:58

## 2022-10-04 ENCOUNTER — Telehealth: Payer: Self-pay

## 2022-10-04 ENCOUNTER — Ambulatory Visit (HOSPITAL_COMMUNITY)
Admission: RE | Admit: 2022-10-04 | Discharge: 2022-10-04 | Disposition: A | Discharge status: Home / Self Care | Payer: Commercial Managed Care - HMO | Source: Ambulatory Visit | Attending: Urology | Admitting: Urology

## 2022-10-04 DIAGNOSIS — N200 Calculus of kidney: Secondary | ICD-10-CM | POA: Diagnosis present

## 2022-10-04 NOTE — Telephone Encounter (Signed)
Patient wanting to know if he can have a telephone visit for Friday's visit instead of having an in office visit.  Patient experiences stress and high blood pressure.  Please let pt know asap.  Call back at [phone]    Thanks, Rosey Bath

## 2022-10-05 ENCOUNTER — Ambulatory Visit: Payer: Commercial Managed Care - HMO | Admitting: Urology

## 2022-10-05 ENCOUNTER — Encounter: Payer: Self-pay | Admitting: Urology

## 2022-10-05 DIAGNOSIS — N200 Calculus of kidney: Secondary | ICD-10-CM

## 2022-10-05 DIAGNOSIS — I714 Abdominal aortic aneurysm, without rupture, unspecified: Secondary | ICD-10-CM

## 2022-10-05 DIAGNOSIS — N3001 Acute cystitis with hematuria: Secondary | ICD-10-CM

## 2022-10-05 LAB — URINALYSIS, ROUTINE W REFLEX MICROSCOPIC
Bilirubin, UA: NEGATIVE
Glucose, UA: NEGATIVE
Ketones, UA: NEGATIVE
Leukocytes,UA: NEGATIVE
Nitrite, UA: NEGATIVE
Protein,UA: NEGATIVE
Specific Gravity, UA: 1.015 (ref 1.005–1.030)
Urobilinogen, Ur: 0.2 mg/dL (ref 0.2–1.0)
pH, UA: 5.5 (ref 5.0–7.5)

## 2022-10-05 LAB — MICROSCOPIC EXAMINATION

## 2022-10-05 MED ORDER — SULFAMETHOXAZOLE-TRIMETHOPRIM 800-160 MG PO TABS: 1.0000 | ORAL_TABLET | Freq: Two times a day (BID) | ORAL | 0 refills | Status: DC

## 2022-10-05 NOTE — Patient Instructions (Signed)

## 2022-10-05 NOTE — Progress Notes (Signed)
10/05/2022 10:39 AM   [name] [id number] [id number]  Referring provider: No referring provider defined for this encounter.  Followup nephrolithiasis   HPI: Mr Noah a 87FI here for followup for nephrolithiasis. CT 04/2022 shows a left lower pole calculus. He denies any left flank pain.  He has intermittent bleeding with bowel movements. UA today shows RBCs and few bacteria. He has worsening urinary urgency and frequency.    PMH: Past Medical History:  Diagnosis Date   Aneurysm of ascending aorta (HCC) 12/14/2020   Complication of anesthesia    excessive memory loss with versed   Environmental allergies    Family history of premature coronary artery disease    Hematuria    History of exercise intolerance    11-10-2013   ETT normal    History of kidney stones    Hypertension    primary cardiolgoist-  dr Eden Emms (lov in epic 12-15-2015)---  11-10-2013 ETT normal    Nephrolithiasis    per pt CT at dr Ronne Binning showed bilateral renal stones   Renal calculus, left    White coat syndrome with hypertension     Surgical History: Past Surgical History:  Procedure Laterality Date   CYSTOSCOPY W/ URETERAL STENT PLACEMENT Left 10/04/2017   Procedure: CYSTOSCOPY WITH LEFT  RETROGRADE PYELOGRAM/URETERAL STENT PLACEMENT;  Surgeon: Ihor Gully, MD;  Location: [facility];  Service: Urology;  Laterality: Left;   CYSTOSCOPY WITH RETROGRADE PYELOGRAM, URETEROSCOPY AND STENT PLACEMENT Left 12/09/2017   Procedure: CYSTOSCOPY WITH RETROGRADE PYELOGRAM, URETEROSCOPY AND STENT EXCHANGE, BASKET STONE RETRIVAL;  Surgeon: Malen Gauze, MD;  Location: [facility];  Service: Urology;  Laterality: Left;   CYSTOSCOPY/RETROGRADE/URETEROSCOPY/STONE EXTRACTION WITH BASKET Bilateral left 04-06-2003;  right 10-08-2007   dr Stark Bray  [facility]   HOLMIUM LASER APPLICATION Left 12/09/2017   Procedure: HOLMIUM LASER APPLICATION;  Surgeon: Malen Gauze, MD;  Location: [facility];  Service: Urology;  Laterality: Left;    Home Medications:  Allergies as of 10/05/2022       Reactions   Bee Venom Anaphylaxis   Fire Ant Anaphylaxis   Fire Ant (solenopsis [place]) Anaphylaxis   Gramineae Pollens Anaphylaxis   Aspirin Itching   Bystolic [nebivolol Hcl]    Low HR, forgetful    Onion Itching   Versed [midazolam] Other (See Comments)   "Excessive memory loss for about six months"        Medication List        Accurate as of October 05, 2022 10:39 AM. If you have any questions, ask your nurse or doctor.          amLODipine 5 MG tablet Commonly known as: NORVASC Take 1 tablet (5 mg total) by mouth daily.   ATHLETES FOOT EX Apply 1 spray topically daily as needed (atheletes foot).   hydrALAZINE 25 MG tablet Commonly known as: APRESOLINE Take 1 tablet (25 mg total) by mouth in the morning and at bedtime.   hydrocortisone cream 1 % Apply 1 application topically daily as needed for itching.   ondansetron 4 MG tablet Commonly known as: Zofran Take 1 tablet (4 mg total) by mouth every 8 (eight) hours as needed for nausea or vomiting.   oxyCODONE-acetaminophen 5-325 MG tablet Commonly known as: Percocet Take 1 tablet by mouth every 4 (four) hours as needed.   tamsulosin 0.4 MG Caps capsule Commonly known as: FLOMAX Take 1 capsule (0.4 mg total) by mouth daily.  Allergies:  Allergies  Allergen Reactions   Bee Venom Anaphylaxis   Fire Ant Anaphylaxis   Animatorire Ant (Solenopsis [place]Invicta) Anaphylaxis   Gramineae Pollens Anaphylaxis   Aspirin Itching   Bystolic [Nebivolol Hcl]     Low HR, forgetful    Onion Itching   Versed [Midazolam] Other (See Comments)    "Excessive memory loss for about six months"    Family History: Family History  Problem Relation Age of Onset   Hypertension Father    Hypertension Sister    Hypertension Sister     Social History:  reports that he has never smoked. He has never used smokeless  tobacco. He reports that he does not drink alcohol and does not use drugs.  ROS: All other review of systems were reviewed and are negative except what is noted above in HPI  Physical Exam: There were no vitals taken for this visit.  Constitutional:  Alert and oriented, No acute distress. HEENT: [place] AT, moist mucus membranes.  Trachea midline, no masses. Cardiovascular: No clubbing, cyanosis, or edema. Respiratory: Normal respiratory effort, no increased work of breathing. GI: Abdomen is soft, nontender, nondistended, no abdominal masses GU: No CVA tenderness.  Lymph: No cervical or inguinal lymphadenopathy. Skin: No rashes, bruises or suspicious lesions. Neurologic: Grossly intact, no focal deficits, moving all 4 extremities. Psychiatric: Normal mood and affect.  Laboratory Data: Lab Results  Component Value Date   WBC 6.2 05/03/2022   HGB 13.0 05/03/2022   HCT 41.9 05/03/2022   MCV 65.8 (L) 05/03/2022   PLT 189 05/03/2022    Lab Results  Component Value Date   CREATININE 1.51 (H) 05/03/2022    Lab Results  Component Value Date   PSA 0.8 04/04/2017    No results found for: "TESTOSTERONE"  No results found for: "HGBA1C"  Urinalysis    Component Value Date/Time   COLORURINE COLORLESS (A) 05/03/2022 1624   APPEARANCEUR Clear 05/14/2022 1143   LABSPEC <1.005 (L) 05/03/2022 1624   PHURINE 5.5 05/03/2022 1624   GLUCOSEU Negative 05/14/2022 1143   GLUCOSEU NEGATIVE 11/06/2017 1151   HGBUR NEGATIVE 05/03/2022 1624   BILIRUBINUR Negative 05/14/2022 1143   KETONESUR NEGATIVE 05/03/2022 1624   PROTEINUR Negative 05/14/2022 1143   PROTEINUR NEGATIVE 05/03/2022 1624   UROBILINOGEN 0.2 11/06/2017 1151   NITRITE Negative 05/14/2022 1143   NITRITE NEGATIVE 05/03/2022 1624   LEUKOCYTESUR Negative 05/14/2022 1143   LEUKOCYTESUR NEGATIVE 05/03/2022 1624    Lab Results  Component Value Date   LABMICR See below: 05/14/2022   WBCUA None seen 05/14/2022   LABEPIT None seen  05/14/2022   MUCUS Present 05/14/2022   BACTERIA Few 05/14/2022    Pertinent Imaging: KUb yesterday: Images reviewed and discussed with the patient  Results for orders placed in visit on 12/27/21  Abdomen 1 view (KUB)  Narrative CLINICAL DATA:  Kidney stone.  EXAM: ABDOMEN - 1 VIEW  COMPARISON:  CT abdomen pelvis dated 02/01/2021.  FINDINGS: There is 6 mm faint radiopaque focus over the inferior pole of the left renal silhouette. No other radiopaque calculi identified. No bowel dilatation or evidence of obstruction. No free air. The osseous structures are intact. The soft tissues are unremarkable. Several pelvic phleboliths again noted.  IMPRESSION: A 6 mm left renal inferior pole calculus.   Electronically Signed By: Elgie CollardArash  Radparvar M.D. On: 12/31/2021 00:25  No results found for this or any previous visit.  No results found for this or any previous visit.  No results found for this  or any previous visit.  Results for orders placed during the hospital encounter of 01/31/21  Ultrasound renal complete  Narrative CLINICAL DATA:  Flank pain  EXAM: RENAL / URINARY TRACT ULTRASOUND COMPLETE  COMPARISON:  None.  FINDINGS: Right Kidney:  Renal measurements: 12.2 x 6.0 x 5.6 cm = volume: 216.4 mL. Echogenicity and renal cortical thickness are within normal limits. No perinephric fluid or hydronephrosis visualized. There is a cyst in the lower pole left kidney measuring 1.5 x 1.7 x 1.7 cm. There is a cyst in the mid right kidney measuring 3.2 x 3.2 x 3.2 cm. No sonographically demonstrable calculus or ureterectasis.  Left Kidney:  Renal measurements: 11.2 x 5.4 x 5.9 cm = volume: 189.0 mL. Echogenicity and renal cortical thickness are within normal limits. No perinephric fluid or hydronephrosis visualized. There is a cyst in the lower pole left kidney measuring 2.1 x 1.1 x 1.0 cm. There is a cyst in the mid left kidney measuring 4.2 x 4.2 x 3.4 cm. There  is a nonobstructing calculus in the lower pole left kidney measuring 6 mm. No ureterectasis.  Bladder:  No urinary bladder wall thickening. Mild debris noted within the urinary bladder.  Other:  Prostate measures 6.3 x 5.0 x 3.3 cm with areas of suspected prostatic calcification and mild inhomogeneity to the echotexture.  IMPRESSION: 1.  There are renal cysts bilaterally.  2.  Nonobstructing 6 mm calculus lower pole left kidney.  3. No obstructing focus in either kidney. No hydronephrosis on either side.  4. Mild debris within the bladder. Correlation with urinalysis may be advisable given this appearance. Urinary bladder wall does not appear thickened.  5. Prominent prostate which may warrant correlation with clinical assessment and PSA evaluation.   Electronically Signed By: Bretta Bang III M.D. On: 02/01/2021 10:55  No valid procedures specified. No results found for this or any previous visit.  Results for orders placed in visit on 02/01/21  CT RENAL STONE STUDY  Narrative CLINICAL DATA:  Right flank pain since 01/25/2021 with chills. History of renal calculi.  EXAM: CT ABDOMEN AND PELVIS WITHOUT CONTRAST  TECHNIQUE: Multidetector CT imaging of the abdomen and pelvis was performed following the standard protocol without IV contrast.  COMPARISON:  Multiple exams, including renal ultrasound from 01/31/2021  FINDINGS: Lower chest: Mild descending thoracic aortic atherosclerotic calcification.  Hepatobiliary: Unremarkable  Pancreas: Unremarkable  Spleen: Unremarkable  Adrenals/Urinary Tract: Both adrenal glands appear normal. Fluid density 3.4 by 3.2 cm right mid kidney cyst, image 27 series 2. Fluid density 3.3 by 3.5 cm left mid kidney cyst anteriorly on image 24 series 2. Hypodense 1.5 by 1.6 cm lesion in the right kidney lower pole anteriorly has fluid density favoring a cyst, although faintly indistinct margins suggesting some minimal  complexity which was also suggested by the sonographic appearance on yesterday's ultrasound. Strictly speaking a Bosniak classification cannot be assigned due to the lack of IV contrast, but this is most likely to be a complex cyst.  2 mm right kidney lower pole nonobstructive renal calculus.  There are bed 8 nonobstructive left renal calculi, the larger calculi include a 5 mm lower pole calculus on image 58 series 5 and a 5 mm upper pole calculus on image 63 series 5.  No hydronephrosis or hydroureter. No ureteral or bladder calculi noted.  Stomach/Bowel: Unremarkable  Vascular/Lymphatic: Aortoiliac atherosclerotic vascular disease.  Reproductive: Unremarkable  Other: No supplemental non-categorized findings.  Musculoskeletal: Chronic sclerosis in the left proximal femoral metaphysis on image  84 of series 2 is unchanged from 2016 and considered benign.  IMPRESSION: 1. Bilateral nonobstructive nephrolithiasis. 2. Bilateral renal cysts. Probable mild complexity of the right kidney lower pole hypodense cystic lesion measuring 1.6 cm in long axis. 3. Aortic atherosclerosis.  Aortic Atherosclerosis (ICD10-I70.0).   Electronically Signed By: Gaylyn Rong M.D. On: 02/01/2021 11:39   Assessment & Plan:    1. Nephrolithiasis RTC 6 months with KUB  - Urinalysis, Routine w reflex microscopic   No follow-ups on file.  Wilkie Aye, MD  [facility] [place]

## 2022-10-07 LAB — URINE CULTURE: Organism ID, Bacteria: NO GROWTH

## 2022-10-09 ENCOUNTER — Telehealth: Payer: Self-pay

## 2022-10-09 NOTE — Telephone Encounter (Signed)
Patient called triage requesting a refill for his epi-pen.

## 2022-10-23 ENCOUNTER — Ambulatory Visit: Payer: Commercial Managed Care - HMO | Admitting: Urology

## 2023-02-11 ENCOUNTER — Encounter: Payer: Self-pay | Admitting: Urology

## 2023-02-11 ENCOUNTER — Ambulatory Visit (INDEPENDENT_AMBULATORY_CARE_PROVIDER_SITE_OTHER): Payer: Commercial Managed Care - HMO | Admitting: Urology

## 2023-02-11 ENCOUNTER — Ambulatory Visit (HOSPITAL_COMMUNITY)
Admission: RE | Admit: 2023-02-11 | Discharge: 2023-02-11 | Disposition: A | Discharge status: Home / Self Care | Payer: Commercial Managed Care - HMO | Source: Ambulatory Visit | Attending: Urology | Admitting: Urology

## 2023-02-11 DIAGNOSIS — N200 Calculus of kidney: Secondary | ICD-10-CM | POA: Insufficient documentation

## 2023-02-11 MED ORDER — TAMSULOSIN HCL 0.4 MG PO CAPS: 0.4000 mg | ORAL_CAPSULE | Freq: Every day | ORAL | 11 refills | Status: DC

## 2023-02-11 MED ORDER — OXYCODONE-ACETAMINOPHEN 5-325 MG PO TABS: 1.0000 | ORAL_TABLET | ORAL | 0 refills | Status: DC | PRN

## 2023-02-11 MED ORDER — KETOROLAC TROMETHAMINE 10 MG PO TABS: 10.0000 mg | ORAL_TABLET | Freq: Four times a day (QID) | ORAL | 0 refills | Status: DC | PRN

## 2023-02-11 MED ORDER — ONDANSETRON HCL 4 MG PO TABS: 4.0000 mg | ORAL_TABLET | Freq: Three times a day (TID) | ORAL | 0 refills | Status: DC | PRN

## 2023-02-11 NOTE — Addendum Note (Signed)
Addended by: Cleon Gustin on: 02/11/2023 02:32 PM   Modules accepted: Orders
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
## 2023-02-11 NOTE — Patient Instructions (Signed)
ESWL for Kidney Stones, Care After The following information offers guidance on how to care for yourself after your procedure. Your health care provider may also give you more specific instructions. If you have problems or questions, contact your health care provider. What can I expect after the procedure? After the procedure, it is common to have: Some blood in your urine. This should only last for a few days. Soreness in your back, sides, or upper abdomen for a few days. Blotches or bruises on the area where the shock wave entered the skin. Pain, discomfort, or nausea when pieces (fragments) of the kidney stone move through the tube that carries urine from the kidney to the bladder (ureter). Fragments may pass soon after the procedure. They may also take up to 4-8 weeks to pass. If you have severe pain or nausea, contact your health care provider. This may be caused by a large stone that was not broken up enough. This may mean that you need more treatment. Some pain or discomfort during urination. Some pain or discomfort in the lower abdomen or at the base of the penis. Follow these instructions at home: Medicines  Take over-the-counter and prescription medicines only as told by your health care provider. If you were prescribed antibiotics, take them as told by your health care provider. Do not stop using the antibiotic even if you start to feel better. Ask your health care provider if the medicine prescribed to you: Requires you to avoid driving or using machinery. Can cause constipation. You may need to take these actions to prevent or treat constipation: Take over-the-counter or prescription medicines. Eat foods that are high in fiber, such as beans, whole grains, and fresh fruits and vegetables. Limit foods that are high in fat and processed sugars, such as fried or sweet foods. Eating and drinking  Follow instructions from your health care provider about what you may eat and drink. You  may be told to: Reduce how much salt (sodium) you eat or drink. Check ingredients and nutrition facts on packaged foods and drinks to see how much sodium they contain. Reduce how much meat you eat. Drink enough fluid to keep your urine pale yellow. This can help you pass any pieces of the stone that are left. It can also prevent new stones from forming. Eat plenty of fresh fruits and vegetables. Eat the recommended amount of calcium for your age and gender. Ask your health care provider how much calcium you should have. Activity Get plenty of rest as told by your health care provider. Avoid sitting for a long time without moving. Get up to take short walks every 1-2 hours. This is important to improve blood flow and breathing. Ask for help if you feel weak or unsteady. Your health care provider may tell you to lie in a certain position (postural drainage) and tap firmly (percuss) over your kidney area to help stone fragments pass. Follow instructions as told by your health care provider. Return to your normal activities as told by your health care provider. Ask your health care provider what activities are safe for you. Most people can resume normal activities 1-2 days after the procedure. General instructions If told, strain all urine through the strainer that was provided by your health care provider. Keep all fragments for your health care provider to see. Any stones that are found may be sent to a medical lab for examination. The stone may be as small as a grain of salt. Keep all follow-up  visits. This is important if you had a stent placed because it may need to stay in place for a few weeks. Ask your health care provider when the stent will be removed. Contact a health care provider if: You have a fever or chills. You have severe nausea that leads to persistent vomiting. You have any of these urinary symptoms: Increased blood or blood clots in the urine. Urine that smells bad or unusual. A  strong urge to urinate after emptying your bladder. Pain or burning with urination that does not go away. A continued need to urinate more often than usual. You have a stent, and it comes out. Get help right away if: You have severe pain in your back, sides, or upper abdomen. You faint. You have any of these urinary symptoms: Severe pain while urinating. More blood in your urine, or blood in your urine when you did not have any before. Blood clots in your urine larger than 1 inch (2.5 cm) in size. You pass only a small amount of urine when you urinate or are unable to pass any urine. This information is not intended to replace advice given to you by your health care provider. Make sure you discuss any questions you have with your health care provider. Document Revised: 03/08/2022 Document Reviewed: 03/08/2022 Elsevier Patient Education  [place].

## 2023-02-12 ENCOUNTER — Telehealth: Payer: Self-pay

## 2023-02-12 NOTE — Telephone Encounter (Signed)
Returned patient call to discuss available dates for surgery. Left message to return call.

## 2023-02-12 NOTE — Telephone Encounter (Signed)
Surgery scheduler made aware.

## 2023-02-12 NOTE — Telephone Encounter (Signed)
Patient needing to move forward with getting kidney stone removed as soon as possible.  Please advise.

## 2023-02-18 ENCOUNTER — Telehealth: Payer: Self-pay

## 2023-02-18 NOTE — Telephone Encounter (Signed)
I spoke with Alan Shepherd. We have discussed possible surgery dates and 02/26/2023 was agreed upon by all parties. Patient given information about surgery date, what to expect pre-operatively and post operatively.    We discussed that a pre-op nurse will be calling to set up the pre-op visit that will take place prior to surgery. Informed patient that our office will communicate any additional care to be provided after surgery.    Patients questions or concerns were discussed during our call. Advised to call our office should there be any additional information, questions or concerns that arise. Patient verbalized understanding.

## 2023-02-19 NOTE — Patient Instructions (Signed)
Alan Shepherd  02/19/2023     @PREFPERIOPPHARMACY @   Your procedure is scheduled on  02/26/2023.   Report to [facility] at  1200   P.M.   Call this number if you have problems the morning of surgery:  [phone]  If you experience any cold or flu symptoms such as cough, fever, chills, shortness of breath, etc. between now and your scheduled surgery, please notify us at the above number.   Remember:  Do not eat or drink after midnight.      Take these medicines the morning of surgery with A SIP OF WATER         amlodipine, toradol or oxycodone(if needed), zofran (if needed), flomax.     Do not wear jewelry, make-up or nail polish.  Do not wear lotions, powders, or perfumes, or deodorant.  Do not shave 48 hours prior to surgery.  Men may shave face and neck.  Do not bring valuables to the hospital.  [facility] is not responsible for any belongings or valuables.  Contacts, dentures or bridgework may not be worn into surgery.  Leave your suitcase in the car.  After surgery it may be brought to your room.  For patients admitted to the hospital, discharge time will be determined by your treatment team.  Patients discharged the day of surgery will not be allowed to drive home and must have someone with them for 24 hours.   Special instructions:   DO NOT smoke tobacco or vape for 24 hours before your procedure.  Please read over the following fact sheets that you were given. Coughing and Deep Breathing, Surgical Site Infection Prevention, Anesthesia Post-op Instructions, and Care and Recovery After Surgery      Ureteral Stent Implantation, Care After The following information offers guidance on how to care for yourself after your procedure. Your health care provider may also give you more specific instructions. If you have problems or questions, contact your health care provider. What can I expect after the procedure? After the procedure, it is common to  have: Nausea. Mild pain when you urinate. You may feel this pain in your lower back or lower abdomen. The pain should stop within a few minutes after you urinate. This pattern may last for up to 1 week. A small amount of blood in your urine for several days. Follow these instructions at home: Medicines Take over-the-counter and prescription medicines only as told by your health care provider. If you were prescribed antibiotics, take them as told by your health care provider. Do not stop using the antibiotic even if you start to feel better. If you were given a sedative during the procedure, it can affect you for several hours. Do not drive or operate machinery until your health care provider says that it is safe. Ask your health care provider if the medicine prescribed to you: Requires you to avoid driving or using machinery. Can cause constipation. You may need to take these actions to prevent or treat constipation: Take over-the-counter or prescription medicines. Eat foods that are high in fiber, such as beans, whole grains, and fresh fruits and vegetables. Limit foods that are high in fat and processed sugars, such as fried or sweet foods. Activity Rest as told by your health care provider. Do not sit for a long time without moving. Get up to take short walks every 1-2 hours. This will improve blood flow and breathing. Ask for help if  you feel weak or unsteady. Return to your normal activities as told by your health care provider. Ask your health care provider what activities are safe for you. General instructions  If you have a catheter: Follow instructions from your health care provider about taking care of your catheter and collection bag. Do not Drink enough fluid to keep your urine pale yellow. Do not use any products that contain nicotine or tobacco. These products include cigarettes, chewing tobacco, and vaping devices, such as e-cigarettes. These can delay healing after surgery. If  you need help quitting, ask your health care provider. Keep all follow-up visits. Contact a health care provider if: You start passing blood clots, or you have more than a small amount of blood in your urine. You have pain that gets worse or does not get better with medicine, especially pain when you urinate. You have trouble urinating. You feel nauseous or you vomit again and again during a period of more than 2 days after the procedure. You have a fever. Get help right away if: You are passing blood clots that are 1 inch (2.5 cm) or larger in size. You are leaking urine (have incontinence), or you cannot urinate. The end of the stent comes out of your urethra. You have sudden, sharp, or severe pain in your abdomen or lower back. You have swelling or pain in your legs. You have trouble breathing. These symptoms may be an emergency. Get help right away. Call 911. Do not wait to see if the symptoms will go away. Do not drive yourself to the hospital. Summary After the procedure, it is common to have mild pain when you urinate that goes away within a few minutes after you urinate. This may last for up to 1 week. Take over-the-counter and prescription medicines only as told by your health care provider. Drink enough fluid to keep your urine pale yellow. Call your health care provider if you start passing blood clots, or you have more than a small amount of blood in your urine. This information is not intended to replace advice given to you by your health care provider. Make sure you discuss any questions you have with your health care provider. Document Revised: 12/11/2021 Document Reviewed: 12/11/2021 Elsevier Patient Education  [place] Anesthesia, Adult, Care After The following information offers guidance on how to care for yourself after your procedure. Your health care provider may also give you more specific instructions. If you have problems or questions, contact  your health care provider. What can I expect after the procedure? After the procedure, it is common for people to: Have pain or discomfort at the IV site. Have nausea or vomiting. Have a sore throat or hoarseness. Have trouble concentrating. Feel cold or chills. Feel weak, sleepy, or tired (fatigue). Have soreness and body aches. These can affect parts of the body that were not involved in surgery. Follow these instructions at home: For the time period you were told by your health care provider:  Rest. Do not participate in activities where you could fall or become injured. Do not drive or use machinery. Do not drink alcohol. Do not take sleeping pills or medicines that cause drowsiness. Do not make important decisions or sign legal documents. Do not take care of children on your own. General instructions Drink enough fluid to keep your urine pale yellow. If you have sleep apnea, surgery and certain medicines can increase your risk for breathing problems. Follow instructions from your health care  provider about wearing your sleep device: Anytime you are sleeping, including during daytime naps. While taking prescription pain medicines, sleeping medicines, or medicines that make you drowsy. Return to your normal activities as told by your health care provider. Ask your health care provider what activities are safe for you. Take over-the-counter and prescription medicines only as told by your health care provider. Do not use any products that contain nicotine or tobacco. These products include cigarettes, chewing tobacco, and vaping devices, such as e-cigarettes. These can delay incision healing after surgery. If you need help quitting, ask your health care provider. Contact a health care provider if: You have nausea or vomiting that does not get better with medicine. You vomit every time you eat or drink. You have pain that does not get better with medicine. You cannot urinate or have  bloody urine. You develop a skin rash. You have a fever. Get help right away if: You have trouble breathing. You have chest pain. You vomit blood. These symptoms may be an emergency. Get help right away. Call 911. Do not wait to see if the symptoms will go away. Do not drive yourself to the hospital. Summary After the procedure, it is common to have a sore throat, hoarseness, nausea, vomiting, or to feel weak, sleepy, or fatigue. For the time period you were told by your health care provider, do not drive or use machinery. Get help right away if you have difficulty breathing, have chest pain, or vomit blood. These symptoms may be an emergency. This information is not intended to replace advice given to you by your health care provider. Make sure you discuss any questions you have with your health care provider. Document Revised: 02/02/2022 Document Reviewed: 02/02/2022 Elsevier Patient Education  [place].

## 2023-02-20 ENCOUNTER — Other Ambulatory Visit (HOSPITAL_COMMUNITY)
Admission: RE | Admit: 2023-02-20 | Discharge: 2023-02-20 | Disposition: A | Discharge status: Home / Self Care | Payer: Commercial Managed Care - HMO | Source: Ambulatory Visit | Attending: Urology | Admitting: Urology

## 2023-02-20 ENCOUNTER — Encounter (HOSPITAL_COMMUNITY): Payer: Self-pay

## 2023-02-20 DIAGNOSIS — Z79899 Other long term (current) drug therapy: Secondary | ICD-10-CM | POA: Insufficient documentation

## 2023-02-20 DIAGNOSIS — D649 Anemia, unspecified: Secondary | ICD-10-CM | POA: Insufficient documentation

## 2023-02-20 LAB — BASIC METABOLIC PANEL
Anion gap: 11 (ref 5–15)
BUN: 37 mg/dL — ABNORMAL HIGH (ref 8–23)
CO2: 19 mmol/L — ABNORMAL LOW (ref 22–32)
Calcium: 9.2 mg/dL (ref 8.9–10.3)
Chloride: 107 mmol/L (ref 98–111)
Creatinine, Ser: 2.54 mg/dL — ABNORMAL HIGH (ref 0.61–1.24)
GFR, Estimated: 28 mL/min — ABNORMAL LOW (ref >60)
Glucose, Bld: 93 mg/dL (ref 70–99)
Potassium: 4.1 mmol/L (ref 3.5–5.1)
Sodium: 137 mmol/L (ref 135–145)

## 2023-02-20 LAB — CBC WITH DIFFERENTIAL/PLATELET
Abs Immature Granulocytes: 0.01 10*3/uL (ref 0.00–0.07)
Basophils Absolute: 0.1 10*3/uL (ref 0.0–0.1)
Basophils Relative: 1 %
Eosinophils Absolute: 0.1 10*3/uL (ref 0.0–0.5)
Eosinophils Relative: 2 %
HCT: 37.9 % — ABNORMAL LOW (ref 39.0–52.0)
Hemoglobin: 11.8 g/dL — ABNORMAL LOW (ref 13.0–17.0)
Immature Granulocytes: 0 %
Lymphocytes Relative: 37 %
Lymphs Abs: 2.4 10*3/uL (ref 0.7–4.0)
MCH: 20.8 pg — ABNORMAL LOW (ref 26.0–34.0)
MCHC: 31.1 g/dL (ref 30.0–36.0)
MCV: 66.8 fL — ABNORMAL LOW (ref 80.0–100.0)
Monocytes Absolute: 0.7 10*3/uL (ref 0.1–1.0)
Monocytes Relative: 11 %
Neutro Abs: 3.1 10*3/uL (ref 1.7–7.7)
Neutrophils Relative %: 49 %
Platelets: 206 10*3/uL (ref 150–400)
RBC: 5.67 MIL/uL (ref 4.22–5.81)
RDW: 15.5 % (ref 11.5–15.5)
WBC: 6.3 10*3/uL (ref 4.0–10.5)
nRBC: 0 % (ref 0.0–0.2)

## 2023-02-21 ENCOUNTER — Encounter (HOSPITAL_COMMUNITY)
Admission: RE | Admit: 2023-02-21 | Discharge: 2023-02-21 | Disposition: A | Discharge status: Home / Self Care | Payer: Commercial Managed Care - HMO | Source: Ambulatory Visit | Attending: Urology | Admitting: Urology

## 2023-02-21 DIAGNOSIS — Z79899 Other long term (current) drug therapy: Secondary | ICD-10-CM

## 2023-02-21 DIAGNOSIS — D649 Anemia, unspecified: Secondary | ICD-10-CM

## 2023-02-21 NOTE — Pre-Procedure Instructions (Signed)
BUN and creatinine results sent to Dr Alyson Ingles and Gibson Ramp, RN.

## 2023-02-26 ENCOUNTER — Ambulatory Visit (HOSPITAL_COMMUNITY): Payer: Commercial Managed Care - HMO | Admitting: Certified Registered"

## 2023-02-26 ENCOUNTER — Ambulatory Visit (HOSPITAL_COMMUNITY): Payer: Commercial Managed Care - HMO

## 2023-02-26 ENCOUNTER — Ambulatory Visit (HOSPITAL_BASED_OUTPATIENT_CLINIC_OR_DEPARTMENT_OTHER): Payer: Commercial Managed Care - HMO | Admitting: Certified Registered"

## 2023-02-26 ENCOUNTER — Ambulatory Visit (HOSPITAL_COMMUNITY)
Admission: RE | Admit: 2023-02-26 | Discharge: 2023-02-26 | Disposition: A | Discharge status: Home / Self Care | Payer: Commercial Managed Care - HMO | Attending: Urology | Admitting: Urology

## 2023-02-26 ENCOUNTER — Encounter (HOSPITAL_COMMUNITY): Payer: Self-pay | Admitting: Urology

## 2023-02-26 ENCOUNTER — Encounter (HOSPITAL_COMMUNITY)
Admission: RE | Disposition: A | Discharge status: Home / Self Care | Payer: Self-pay | Source: Home / Self Care | Attending: Urology

## 2023-02-26 DIAGNOSIS — I10 Essential (primary) hypertension: Secondary | ICD-10-CM | POA: Diagnosis not present

## 2023-02-26 DIAGNOSIS — F419 Anxiety disorder, unspecified: Secondary | ICD-10-CM

## 2023-02-26 DIAGNOSIS — D649 Anemia, unspecified: Secondary | ICD-10-CM

## 2023-02-26 DIAGNOSIS — N201 Calculus of ureter: Secondary | ICD-10-CM

## 2023-02-26 DIAGNOSIS — Z79899 Other long term (current) drug therapy: Secondary | ICD-10-CM

## 2023-02-26 HISTORY — PX: CYSTOSCOPY WITH RETROGRADE PYELOGRAM, URETEROSCOPY AND STENT PLACEMENT: SHX5789

## 2023-02-26 HISTORY — PX: HOLMIUM LASER APPLICATION: SHX5852

## 2023-02-26 SURGERY — CYSTOURETEROSCOPY, WITH RETROGRADE PYELOGRAM AND STENT INSERTION
Anesthesia: General | Site: Penis | Laterality: Left

## 2023-02-26 MED ORDER — LACTATED RINGERS IV SOLN: INTRAVENOUS | Status: DC

## 2023-02-26 MED ORDER — FENTANYL CITRATE (PF) 100 MCG/2ML IJ SOLN
INTRAMUSCULAR | Status: DC | PRN
  Administered 2023-02-26 (×2): 50 ug via INTRAVENOUS

## 2023-02-26 MED ORDER — SODIUM BICARBONATE 650 MG PO TABS: 650.0000 mg | ORAL_TABLET | Freq: Two times a day (BID) | ORAL | 3 refills | Status: DC

## 2023-02-26 MED ORDER — TAMSULOSIN HCL 0.4 MG PO CAPS: 0.4000 mg | ORAL_CAPSULE | Freq: Every day | ORAL | 11 refills | Status: DC

## 2023-02-26 MED ORDER — ONDANSETRON HCL 4 MG/2ML IJ SOLN
INTRAMUSCULAR | Status: AC
  Filled 2023-02-26: qty 2

## 2023-02-26 MED ORDER — FENTANYL CITRATE (PF) 100 MCG/2ML IJ SOLN
INTRAMUSCULAR | Status: AC
  Filled 2023-02-26: qty 2

## 2023-02-26 MED ORDER — PHENYLEPHRINE 80 MCG/ML (10ML) SYRINGE FOR IV PUSH (FOR BLOOD PRESSURE SUPPORT)
PREFILLED_SYRINGE | INTRAVENOUS | Status: DC | PRN
  Administered 2023-02-26: 160 ug via INTRAVENOUS
  Administered 2023-02-26: 100 ug via INTRAVENOUS
  Administered 2023-02-26: 160 ug via INTRAVENOUS
  Administered 2023-02-26: 100 ug via INTRAVENOUS

## 2023-02-26 MED ORDER — OXYCODONE-ACETAMINOPHEN 5-325 MG PO TABS: 1.0000 | ORAL_TABLET | ORAL | 0 refills | Status: DC | PRN

## 2023-02-26 MED ORDER — ONDANSETRON HCL 4 MG/2ML IJ SOLN: 4.0000 mg | Freq: Once | INTRAMUSCULAR | Status: DC | PRN

## 2023-02-26 MED ORDER — OXYCODONE HCL 5 MG PO TABS: 5.0000 mg | ORAL_TABLET | Freq: Once | ORAL | Status: DC | PRN

## 2023-02-26 MED ORDER — LIDOCAINE HCL (PF) 2 % IJ SOLN
INTRAMUSCULAR | Status: AC
  Filled 2023-02-26: qty 5

## 2023-02-26 MED ORDER — ONDANSETRON HCL 4 MG/2ML IJ SOLN
INTRAMUSCULAR | Status: DC | PRN
  Administered 2023-02-26: 4 mg via INTRAVENOUS

## 2023-02-26 MED ORDER — PROPOFOL 10 MG/ML IV BOLUS
INTRAVENOUS | Status: DC | PRN
  Administered 2023-02-26: 50 mg via INTRAVENOUS
  Administered 2023-02-26: 200 mg via INTRAVENOUS
  Administered 2023-02-26: 50 mg via INTRAVENOUS

## 2023-02-26 MED ORDER — CEFAZOLIN SODIUM-DEXTROSE 2-4 GM/100ML-% IV SOLN
2.0000 g | INTRAVENOUS | Status: AC
  Administered 2023-02-26: 2 g via INTRAVENOUS

## 2023-02-26 MED ORDER — GLYCOPYRROLATE PF 0.2 MG/ML IJ SOSY
PREFILLED_SYRINGE | INTRAMUSCULAR | Status: DC | PRN
  Administered 2023-02-26: .2 mg via INTRAVENOUS

## 2023-02-26 MED ORDER — CEFAZOLIN SODIUM-DEXTROSE 2-4 GM/100ML-% IV SOLN
INTRAVENOUS | Status: AC
  Filled 2023-02-26: qty 100

## 2023-02-26 MED ORDER — PHENYLEPHRINE 80 MCG/ML (10ML) SYRINGE FOR IV PUSH (FOR BLOOD PRESSURE SUPPORT)
PREFILLED_SYRINGE | INTRAVENOUS | Status: AC
  Filled 2023-02-26: qty 10

## 2023-02-26 MED ORDER — DEXMEDETOMIDINE HCL IN NACL 80 MCG/20ML IV SOLN
INTRAVENOUS | Status: AC
  Filled 2023-02-26: qty 20

## 2023-02-26 MED ORDER — SODIUM CHLORIDE 0.9 % IR SOLN
Status: DC | PRN
  Administered 2023-02-26 (×2): 3000 mL

## 2023-02-26 MED ORDER — WATER FOR IRRIGATION, STERILE IR SOLN
Status: DC | PRN
  Administered 2023-02-26: 500 mL

## 2023-02-26 MED ORDER — PROPOFOL 10 MG/ML IV BOLUS
INTRAVENOUS | Status: AC
  Filled 2023-02-26: qty 20

## 2023-02-26 MED ORDER — LIDOCAINE 2% (20 MG/ML) 5 ML SYRINGE
INTRAMUSCULAR | Status: DC | PRN
  Administered 2023-02-26: 100 mg via INTRAVENOUS

## 2023-02-26 MED ORDER — OXYCODONE HCL 5 MG/5ML PO SOLN: 5.0000 mg | Freq: Once | ORAL | Status: DC | PRN

## 2023-02-26 MED ORDER — ONDANSETRON HCL 4 MG PO TABS: 4.0000 mg | ORAL_TABLET | Freq: Three times a day (TID) | ORAL | 0 refills | Status: DC | PRN

## 2023-02-26 MED ORDER — DEXMEDETOMIDINE HCL IN NACL 80 MCG/20ML IV SOLN
INTRAVENOUS | Status: DC | PRN
  Administered 2023-02-26: 4 ug via BUCCAL
  Administered 2023-02-26: 8 ug via BUCCAL

## 2023-02-26 MED ORDER — DEXAMETHASONE SODIUM PHOSPHATE 4 MG/ML IJ SOLN
INTRAMUSCULAR | Status: DC | PRN
  Administered 2023-02-26: 5 mg via INTRAVENOUS

## 2023-02-26 MED ORDER — FENTANYL CITRATE PF 50 MCG/ML IJ SOSY: 25.0000 ug | PREFILLED_SYRINGE | INTRAMUSCULAR | Status: DC | PRN

## 2023-02-26 MED ORDER — ORAL CARE MOUTH RINSE: 15.0000 mL | Freq: Once | OROMUCOSAL | Status: AC

## 2023-02-26 MED ORDER — DIATRIZOATE MEGLUMINE 30 % UR SOLN
URETHRAL | Status: DC | PRN
  Administered 2023-02-26: 5 mL via URETHRAL

## 2023-02-26 MED ORDER — CHLORHEXIDINE GLUCONATE 0.12 % MT SOLN
15.0000 mL | Freq: Once | OROMUCOSAL | Status: AC
  Administered 2023-02-26: 15 mL via OROMUCOSAL

## 2023-02-26 MED ORDER — DIATRIZOATE MEGLUMINE 30 % UR SOLN
URETHRAL | Status: AC
  Filled 2023-02-26: qty 100

## 2023-02-26 MED ORDER — GLYCOPYRROLATE PF 0.2 MG/ML IJ SOSY
PREFILLED_SYRINGE | INTRAMUSCULAR | Status: AC
  Filled 2023-02-26: qty 1

## 2023-02-26 SURGICAL SUPPLY — 25 items
BAG DRAIN URO TABLE W/ADPT NS (BAG) ×1 IMPLANT
BAG DRN 8 ADPR NS SKTRN CSTL (BAG) ×1
BAG HAMPER (MISCELLANEOUS) ×1 IMPLANT
CATH INTERMIT  6FR 70CM (CATHETERS) ×1 IMPLANT
CLOTH BEACON ORANGE TIMEOUT ST (SAFETY) ×1 IMPLANT
EXTRACTOR STONE NITINOL NGAGE (UROLOGICAL SUPPLIES) IMPLANT
GLOVE BIO SURGEON STRL SZ8 (GLOVE) ×1 IMPLANT
GLOVE BIOGEL PI IND STRL 7.0 (GLOVE) ×2 IMPLANT
GOWN STRL REUS W/TWL LRG LVL3 (GOWN DISPOSABLE) ×1 IMPLANT
GOWN STRL REUS W/TWL XL LVL3 (GOWN DISPOSABLE) ×1 IMPLANT
GUIDEWIRE STR DUAL SENSOR (WIRE) ×1 IMPLANT
GUIDEWIRE STR ZIPWIRE 035X150 (MISCELLANEOUS) ×1 IMPLANT
IV NS IRRIG 3000ML ARTHROMATIC (IV SOLUTION) ×2 IMPLANT
KIT TURNOVER CYSTO (KITS) ×1 IMPLANT
MANIFOLD NEPTUNE II (INSTRUMENTS) ×1 IMPLANT
PACK CYSTO (CUSTOM PROCEDURE TRAY) ×1 IMPLANT
PAD ARMBOARD 7.5X6 YLW CONV (MISCELLANEOUS) ×1 IMPLANT
SHEATH URETERAL 12FRX35CM (MISCELLANEOUS) IMPLANT
STENT URET 6FRX26 CONTOUR (STENTS) IMPLANT
SYR 10ML LL (SYRINGE) ×1 IMPLANT
SYR CONTROL 10ML LL (SYRINGE) ×1 IMPLANT
TOWEL OR 17X26 4PK STRL BLUE (TOWEL DISPOSABLE) ×1 IMPLANT
TRACTIP FLEXIVA PULS ID 200XHI (Laser) IMPLANT
TRACTIP FLEXIVA PULSE ID 200 (Laser) ×1
WATER STERILE IRR 500ML POUR (IV SOLUTION) ×1 IMPLANT

## 2023-02-26 NOTE — Transfer of Care (Signed)
Immediate Anesthesia Transfer of Care Note  Patient: Alan Shepherd  Procedure(s) Performed: CYSTOSCOPY WITH RETROGRADE PYELOGRAM, URETEROSCOPY AND STENT PLACEMENT (Left: Penis) HOLMIUM LASER APPLICATION (Left: Penis)  Patient Location: PACU  Anesthesia Type:General  Level of Consciousness: awake  Airway & Oxygen Therapy: Patient Spontanous Breathing  Post-op Assessment: Report given to RN  Post vital signs: Reviewed  Last Vitals:  Vitals Value Taken Time  BP    Temp    Pulse 95 02/26/23 1540  Resp 16 02/26/23 1540  SpO2 98 % 02/26/23 1540  Vitals shown include unvalidated device data.  Last Pain:  Vitals:   02/26/23 1311  TempSrc: Oral  PainSc: 4          Complications: No notable events documented.

## 2023-02-26 NOTE — Anesthesia Preprocedure Evaluation (Signed)
Anesthesia Evaluation  Patient identified by MRN, date of birth, ID band Patient awake    Reviewed: Allergy & Precautions, H&P , NPO status , Patient's Chart, lab work & pertinent test results, reviewed documented beta blocker date and time   History of Anesthesia Complications (+) history of anesthetic complications  Airway Mallampati: II  TM Distance: >3 FB Neck ROM: full    Dental no notable dental hx.    Pulmonary neg pulmonary ROS   Pulmonary exam normal breath sounds clear to auscultation       Cardiovascular Exercise Tolerance: Good hypertension, negative cardio ROS  Rhythm:regular Rate:Normal     Neuro/Psych   Anxiety     negative neurological ROS  negative psych ROS   GI/Hepatic negative GI ROS, Neg liver ROS,,,  Endo/Other  negative endocrine ROS    Renal/GU Renal diseasenegative Renal ROS  negative genitourinary   Musculoskeletal   Abdominal   Peds  Hematology negative hematology ROS (+) Blood dyscrasia, anemia   Anesthesia Other Findings   Reproductive/Obstetrics negative OB ROS                             Anesthesia Physical Anesthesia Plan  ASA: 3  Anesthesia Plan: General and General LMA   Post-op Pain Management:    Induction:   PONV Risk Score and Plan: Ondansetron  Airway Management Planned:   Additional Equipment:   Intra-op Plan:   Post-operative Plan:   Informed Consent: I have reviewed the patients History and Physical, chart, labs and discussed the procedure including the risks, benefits and alternatives for the proposed anesthesia with the patient or authorized representative who has indicated his/her understanding and acceptance.     Dental Advisory Given  Plan Discussed with: CRNA  Anesthesia Plan Comments: (Avoid Versed)       Anesthesia Quick Evaluation

## 2023-02-26 NOTE — Anesthesia Procedure Notes (Signed)
Procedure Name: LMA Insertion Date/Time: 02/26/2023 2:33 PM  Performed by: Julian Reil, CRNAPre-anesthesia Checklist: Patient identified, Emergency Drugs available, Suction available and Patient being monitored Patient Re-evaluated:Patient Re-evaluated prior to induction Oxygen Delivery Method: Circle system utilized Preoxygenation: Pre-oxygenation with 100% oxygen Induction Type: IV induction Ventilation: Mask ventilation without difficulty LMA: LMA with gastric port inserted LMA Size: 4.0 Tube type: Oral Number of attempts: 1 Placement Confirmation: positive ETCO2 Tube secured with: Tape Dental Injury: Teeth and Oropharynx as per pre-operative assessment

## 2023-02-26 NOTE — Interval H&P Note (Signed)
History and Physical Interval Note:  02/26/2023 2:17 PM  Alan Shepherd  has presented today for surgery, with the diagnosis of Left ureteral calculus.  The various methods of treatment have been discussed with the patient and family. After consideration of risks, benefits and other options for treatment, the patient has consented to  Procedure(s): CYSTOSCOPY WITH RETROGRADE PYELOGRAM, URETEROSCOPY AND STENT PLACEMENT (Left) HOLMIUM LASER APPLICATION (Left) as a surgical intervention.  The patient's history has been reviewed, patient examined, no change in status, stable for surgery.  I have reviewed the patient's chart and labs.  Questions were answered to the patient's satisfaction.     Wilkie Aye

## 2023-02-26 NOTE — Op Note (Signed)
.  Preoperative diagnosis: Left ureteral stone  Postoperative diagnosis: Same  Procedure: 1 cystoscopy 2. Left retrograde pyelography 3.  Intraoperative fluoroscopy, under one hour, with interpretation 4.  Left ureteroscopic stone manipulation with laser lithotripsy 5.  Left 6 x 26 JJ stent placement  Attending: Cleda Mccreedy  Anesthesia: General  Estimated blood loss: None  Drains: Left 6 x 26 JJ ureteral stent without tether  Specimens: stone for analysis  Antibiotics: ancef  Findings: left renal stone. mild hydronephrosis. No masses/lesions in the bladder. Ureteral orifices in normal anatomic location.  Indications: Patient is a 62 year old male with a history of left ureteral stone and who has persistent left flank pain.  After discussing treatment options, he decided proceed with left ureteroscopic stone manipulation.  Procedure in detail: The patient was brought to the operating room and a brief timeout was done to ensure correct patient, correct procedure, correct site.  General anesthesia was administered patient was placed in dorsal lithotomy position.  His genitalia was then prepped and draped in usual sterile fashion.  A rigid 22 French cystoscope was passed in the urethra and the bladder.  Bladder was inspected free masses or lesions.  the ureteral orifices were in the normal orthotopic locations.  a 6 french ureteral catheter was then instilled into the left ureteral orifice.  a gentle retrograde was obtained and findings noted above.  we then placed a zip wire through the ureteral catheter and advanced up to the renal pelvis.  we then removed the cystoscope and cannulated the left ureteral orifice with a semirigid ureteroscope.  No stone was found in the ureter. Once we reached the UPJ a sensor wire was advanced in to the renal pelvis. We then removed the ureteroscope and advanced am 12/14 x 35cm access sheath up to the renal pelvis. We then used the flexible ureteroscope to  perform nephroscopy. We encountered the stone in the lower pole.    the pieces were then removed with a Ngage basket.    once the large stone fragments were removed we then removed the access sheath under direct vision and noted no injury to the ureter. We then placed a 6 x 26 double-j ureteral stent over the original zip wire.  We then removed the wire and good coil was noted in the the renal pelvis under fluoroscopy and the bladder under direct vision. the bladder was then drained and this concluded the procedure which was well tolerated by patient.  Complications: None  Condition: Stable, extubated, transferred to PACU  Plan: Patient is to be discharged home as to follow-up in one week. H eis to remove his stent in 72 hours by pulling the tether

## 2023-02-27 ENCOUNTER — Telehealth: Payer: Self-pay

## 2023-02-27 NOTE — Telephone Encounter (Signed)
Return call to patient. Patient voiced that he is passing particular and wants to know if all the stone have been retrieve and is  there another x-ray needed to ensure stone are gone. Patient is aware to pull the teeter stent 72 hour after surgery according to Dr. Ronne Binning op note. Patient is aware that a task will be sent to Dr. Ronne Binning and someone will reach out with recommendation. Patient voiced understanding

## 2023-03-01 NOTE — Anesthesia Postprocedure Evaluation (Signed)
Anesthesia Post Note  Patient: Alan Shepherd  Procedure(s) Performed: CYSTOSCOPY WITH RETROGRADE PYELOGRAM, URETEROSCOPY AND STENT PLACEMENT (Left: Penis) HOLMIUM LASER APPLICATION (Left: Penis)  Patient location during evaluation: Phase II Anesthesia Type: General Level of consciousness: awake Pain management: pain level controlled Vital Signs Assessment: post-procedure vital signs reviewed and stable Respiratory status: spontaneous breathing and respiratory function stable Cardiovascular status: blood pressure returned to baseline and stable Postop Assessment: no headache and no apparent nausea or vomiting Anesthetic complications: no Comments: Late entry   No notable events documented.   Last Vitals:  Vitals:   02/26/23 1600 02/26/23 1619  BP: (!) 138/101 (!) 138/100  Pulse: 86 85  Resp: 11 16  Temp:  36.6 C  SpO2: 99% 99%    Last Pain:  Vitals:   02/26/23 1619  TempSrc:   PainSc: 0-No pain                 Windell Norfolk

## 2023-03-04 LAB — CALCULI, WITH PHOTOGRAPH (CLINICAL LAB)
Uric Acid Calculi: 100 %
Weight Calculi: 33 mg

## 2023-03-05 ENCOUNTER — Encounter (HOSPITAL_COMMUNITY): Payer: Self-pay | Admitting: Urology

## 2023-03-12 ENCOUNTER — Telehealth: Payer: Self-pay

## 2023-03-12 NOTE — Telephone Encounter (Signed)
Would you like for patient to have a KUB?

## 2023-03-12 NOTE — Telephone Encounter (Signed)
Patient called advising he has an appt Friday and would like a KUB order placed to have done prior to appt.    Thank you

## 2023-03-13 ENCOUNTER — Ambulatory Visit (HOSPITAL_COMMUNITY)
Admission: RE | Admit: 2023-03-13 | Discharge: 2023-03-13 | Disposition: A | Discharge status: Home / Self Care | Payer: Commercial Managed Care - HMO | Source: Ambulatory Visit | Attending: Urology | Admitting: Urology

## 2023-03-13 ENCOUNTER — Other Ambulatory Visit: Payer: Self-pay

## 2023-03-13 ENCOUNTER — Ambulatory Visit (INDEPENDENT_AMBULATORY_CARE_PROVIDER_SITE_OTHER): Payer: Commercial Managed Care - HMO | Admitting: Urology

## 2023-03-13 DIAGNOSIS — N281 Cyst of kidney, acquired: Secondary | ICD-10-CM

## 2023-03-13 DIAGNOSIS — N200 Calculus of kidney: Secondary | ICD-10-CM | POA: Diagnosis present

## 2023-03-13 LAB — URINALYSIS, ROUTINE W REFLEX MICROSCOPIC
Bilirubin, UA: NEGATIVE
Glucose, UA: NEGATIVE
Ketones, UA: NEGATIVE
Leukocytes,UA: NEGATIVE
Nitrite, UA: NEGATIVE
Protein,UA: NEGATIVE
RBC, UA: NEGATIVE
Specific Gravity, UA: 1.015 (ref 1.005–1.030)
Urobilinogen, Ur: 0.2 mg/dL (ref 0.2–1.0)
pH, UA: 5.5 (ref 5.0–7.5)

## 2023-03-13 MED ORDER — ALLOPURINOL 300 MG PO TABS
300.0000 mg | ORAL_TABLET | Freq: Every day | ORAL | 11 refills | Status: DC
Start: 2023-03-13 — End: 2023-05-15

## 2023-03-13 MED ORDER — POTASSIUM CITRATE ER 15 MEQ (1620 MG) PO TBCR: 1.0000 | EXTENDED_RELEASE_TABLET | Freq: Two times a day (BID) | ORAL | 11 refills | Status: DC

## 2023-03-13 NOTE — Progress Notes (Signed)
03/13/2023 11:04 AM   Alan Shepherd 1961-11-15 [id number]  Referring provider: No referring provider defined for this encounter.  Followup nephrolithiasis   HPI: Alan Shepherd is a 62yo here for followup for nephrolithiasis. He stopped sodium bicarbonate. He is off flomax. Stone composition 100% uric acid. He passed a couple of fragments. No flank pain currently.    PMH: Past Medical History:  Diagnosis Date   Aneurysm of ascending aorta 12/14/2020   Complication of anesthesia    excessive memory loss with versed   Environmental allergies    Family history of premature coronary artery disease    Hematuria    History of exercise intolerance    11-10-2013   ETT normal    History of kidney stones    Hypertension    primary cardiolgoist-  Alan Shepherd (lov in epic 12-15-2015)---  11-10-2013 ETT normal    Nephrolithiasis    per pt CT at [facility] showed bilateral renal stones   Renal calculus, left    White coat syndrome with hypertension     Surgical History: Past Surgical History:  Procedure Laterality Date   CYSTOSCOPY W/ URETERAL STENT PLACEMENT Left 10/04/2017   Procedure: CYSTOSCOPY WITH LEFT  RETROGRADE PYELOGRAM/URETERAL STENT PLACEMENT;  Surgeon: Alan Gully, MD;  Location: [facility];  Service: Urology;  Laterality: Left;   CYSTOSCOPY WITH RETROGRADE PYELOGRAM, URETEROSCOPY AND STENT PLACEMENT Left 12/09/2017   Procedure: CYSTOSCOPY WITH RETROGRADE PYELOGRAM, URETEROSCOPY AND STENT EXCHANGE, BASKET STONE RETRIVAL;  Surgeon: Alan Gauze, MD;  Location: [facility];  Service: Urology;  Laterality: Left;   CYSTOSCOPY WITH RETROGRADE PYELOGRAM, URETEROSCOPY AND STENT PLACEMENT Left 02/26/2023   Procedure: CYSTOSCOPY WITH RETROGRADE PYELOGRAM, URETEROSCOPY AND STENT PLACEMENT;  Surgeon: Alan Gauze, MD;  Location: [facility];  Service: Urology;  Laterality: Left;   CYSTOSCOPY/RETROGRADE/URETEROSCOPY/STONE EXTRACTION WITH BASKET Bilateral left 04-06-2003;   right 10-08-2007   Alan Shepherd  [facility]   HOLMIUM LASER APPLICATION Left 12/09/2017   Procedure: HOLMIUM LASER APPLICATION;  Surgeon: Alan Gauze, MD;  Location: [facility];  Service: Urology;  Laterality: Left;   HOLMIUM LASER APPLICATION Left 02/26/2023   Procedure: HOLMIUM LASER APPLICATION;  Surgeon: Alan Gauze, MD;  Location: [facility];  Service: Urology;  Laterality: Left;    Home Medications:  Allergies as of 03/13/2023       Reactions   Bee Venom Anaphylaxis   Fire Ant Anaphylaxis   Fire Ant (solenopsis [place]) Anaphylaxis   Gramineae Pollens Anaphylaxis   Aspirin Itching   Bystolic [nebivolol Hcl]    Low HR, forgetful    Onion Itching   Versed [midazolam] Other (See Comments)   "Excessive memory loss for about six months"        Medication List        Accurate as of March 13, 2023 11:04 AM. If you have any questions, ask your nurse or doctor.          amLODipine 5 MG tablet Commonly known as: NORVASC Take 1 tablet (5 mg total) by mouth daily.   ATHLETES FOOT EX Apply 1 spray topically daily as needed (athlete's foot).   EPINEPHrine 0.3 mg/0.3 mL Soaj injection Commonly known as: EPI-PEN Inject 0.3 mg into the muscle as needed for anaphylaxis.   hydrALAZINE 25 MG tablet Commonly known as: APRESOLINE Take 1 tablet (25 mg total) by mouth in the morning and at bedtime.   ketorolac 10 MG tablet Commonly known as: TORADOL Take 1 tablet (10  mg total) by mouth every 6 (six) hours as needed.   ondansetron 4 MG tablet Commonly known as: Zofran Take 1 tablet (4 mg total) by mouth every 8 (eight) hours as needed for nausea or vomiting.   oxyCODONE-acetaminophen 5-325 MG tablet Commonly known as: Percocet Take 1 tablet by mouth every 4 (four) hours as needed.   sodium bicarbonate 650 MG tablet Take 1 tablet (650 mg total) by mouth 2 (two) times daily.   sulfamethoxazole-trimethoprim 800-160 MG tablet Commonly known as: BACTRIM  DS Take 1 tablet by mouth every 12 (twelve) hours.   tamsulosin 0.4 MG Caps capsule Commonly known as: FLOMAX Take 1 capsule (0.4 mg total) by mouth daily.        Allergies:  Allergies  Allergen Reactions   Bee Venom Anaphylaxis   Fire Ant Anaphylaxis   Animator (Solenopsis [place]) Anaphylaxis   Gramineae Pollens Anaphylaxis   Aspirin Itching   Bystolic [Nebivolol Hcl]     Low HR, forgetful    Onion Itching   Versed [Midazolam] Other (See Comments)    "Excessive memory loss for about six months"    Family History: Family History  Problem Relation Age of Onset   Hypertension Father    Hypertension Sister    Hypertension Sister     Social History:  reports that he has never smoked. He has never used smokeless tobacco. He reports that he does not drink alcohol and does not use drugs.  ROS: All other review of systems were reviewed and are negative except what is noted above in HPI  Physical Exam: There were no vitals taken for this visit.  Constitutional:  Alert and oriented, No acute distress. HEENT: [employer] AT, moist mucus membranes.  Trachea midline, no masses. Cardiovascular: No clubbing, cyanosis, or edema. Respiratory: Normal respiratory effort, no increased work of breathing. GI: Abdomen is soft, nontender, nondistended, no abdominal masses GU: No CVA tenderness.  Lymph: No cervical or inguinal lymphadenopathy. Skin: No rashes, bruises or suspicious lesions. Neurologic: Grossly intact, no focal deficits, moving all 4 extremities. Psychiatric: Normal mood and affect.  Laboratory Data: Lab Results  Component Value Date   WBC 6.3 02/20/2023   HGB 11.8 (L) 02/20/2023   HCT 37.9 (L) 02/20/2023   MCV 66.8 (L) 02/20/2023   PLT 206 02/20/2023    Lab Results  Component Value Date   CREATININE 2.54 (H) 02/20/2023    Lab Results  Component Value Date   PSA 0.8 04/04/2017    No results found for: "TESTOSTERONE"  No results found for:  "HGBA1C"  Urinalysis    Component Value Date/Time   COLORURINE COLORLESS (A) 05/03/2022 1624   APPEARANCEUR Clear 10/05/2022 1018   LABSPEC <1.005 (L) 05/03/2022 1624   PHURINE 5.5 05/03/2022 1624   GLUCOSEU Negative 10/05/2022 1018   GLUCOSEU NEGATIVE 11/06/2017 1151   HGBUR NEGATIVE 05/03/2022 1624   BILIRUBINUR Negative 10/05/2022 1018   KETONESUR NEGATIVE 05/03/2022 1624   PROTEINUR Negative 10/05/2022 1018   PROTEINUR NEGATIVE 05/03/2022 1624   UROBILINOGEN 0.2 11/06/2017 1151   NITRITE Negative 10/05/2022 1018   NITRITE NEGATIVE 05/03/2022 1624   LEUKOCYTESUR Negative 10/05/2022 1018   LEUKOCYTESUR NEGATIVE 05/03/2022 1624    Lab Results  Component Value Date   LABMICR See below: 10/05/2022   WBCUA 0-5 10/05/2022   LABEPIT 0-10 10/05/2022   MUCUS Present 05/14/2022   BACTERIA Few (A) 10/05/2022    Pertinent Imaging:  Results for orders placed during the hospital encounter of 02/11/23  Abdomen 1  view (KUB)  Narrative CLINICAL DATA:  Nephrolithiasis  EXAM: ABDOMEN - 1 VIEW  COMPARISON:  10/04/2022.  FINDINGS: The bowel gas pattern is normal. No calcifications in the identified in the upper abdomen. Numerous calcifications overlie the pelvis which could be phleboliths or distal ureteral stones. This can be evaluated with a noncontrast CT.  IMPRESSION: Pelvic calcifications consistent with distal ureteral stone or phleboliths.   Electronically Signed By: Layla Maw M.D. On: 02/12/2023 14:21  No results found for this or any previous visit.  No results found for this or any previous visit.  No results found for this or any previous visit.  Results for orders placed during the hospital encounter of 01/31/21  Ultrasound renal complete  Narrative CLINICAL DATA:  Flank pain  EXAM: RENAL / URINARY TRACT ULTRASOUND COMPLETE  COMPARISON:  None.  FINDINGS: Right Kidney:  Renal measurements: 12.2 x 6.0 x 5.6 cm = volume: 216.4  mL. Echogenicity and renal cortical thickness are within normal limits. No perinephric fluid or hydronephrosis visualized. There is a cyst in the lower pole left kidney measuring 1.5 x 1.7 x 1.7 cm. There is a cyst in the mid right kidney measuring 3.2 x 3.2 x 3.2 cm. No sonographically demonstrable calculus or ureterectasis.  Left Kidney:  Renal measurements: 11.2 x 5.4 x 5.9 cm = volume: 189.0 mL. Echogenicity and renal cortical thickness are within normal limits. No perinephric fluid or hydronephrosis visualized. There is a cyst in the lower pole left kidney measuring 2.1 x 1.1 x 1.0 cm. There is a cyst in the mid left kidney measuring 4.2 x 4.2 x 3.4 cm. There is a nonobstructing calculus in the lower pole left kidney measuring 6 mm. No ureterectasis.  Bladder:  No urinary bladder wall thickening. Mild debris noted within the urinary bladder.  Other:  Prostate measures 6.3 x 5.0 x 3.3 cm with areas of suspected prostatic calcification and mild inhomogeneity to the echotexture.  IMPRESSION: 1.  There are renal cysts bilaterally.  2.  Nonobstructing 6 mm calculus lower pole left kidney.  3. No obstructing focus in either kidney. No hydronephrosis on either side.  4. Mild debris within the bladder. Correlation with urinalysis may be advisable given this appearance. Urinary bladder wall does not appear thickened.  5. Prominent prostate which may warrant correlation with clinical assessment and PSA evaluation.   Electronically Signed By: Bretta Bang III M.D. On: 02/01/2021 10:55  No valid procedures specified. No results found for this or any previous visit.  Results for orders placed in visit on 02/01/21  CT RENAL STONE STUDY  Narrative CLINICAL DATA:  Right flank pain since 01/25/2021 with chills. History of renal calculi.  EXAM: CT ABDOMEN AND PELVIS WITHOUT CONTRAST  TECHNIQUE: Multidetector CT imaging of the abdomen and pelvis was  performed following the standard protocol without IV contrast.  COMPARISON:  Multiple exams, including renal ultrasound from 01/31/2021  FINDINGS: Lower chest: Mild descending thoracic aortic atherosclerotic calcification.  Hepatobiliary: Unremarkable  Pancreas: Unremarkable  Spleen: Unremarkable  Adrenals/Urinary Tract: Both adrenal glands appear normal. Fluid density 3.4 by 3.2 cm right mid kidney cyst, image 27 series 2. Fluid density 3.3 by 3.5 cm left mid kidney cyst anteriorly on image 24 series 2. Hypodense 1.5 by 1.6 cm lesion in the right kidney lower pole anteriorly has fluid density favoring a cyst, although faintly indistinct margins suggesting some minimal complexity which was also suggested by the sonographic appearance on yesterday's ultrasound. Strictly speaking a Bosniak classification cannot be  assigned due to the lack of IV contrast, but this is most likely to be a complex cyst.  2 mm right kidney lower pole nonobstructive renal calculus.  There are bed 8 nonobstructive left renal calculi, the larger calculi include a 5 mm lower pole calculus on image 58 series 5 and a 5 mm upper pole calculus on image 63 series 5.  No hydronephrosis or hydroureter. No ureteral or bladder calculi noted.  Stomach/Bowel: Unremarkable  Vascular/Lymphatic: Aortoiliac atherosclerotic vascular disease.  Reproductive: Unremarkable  Other: No supplemental non-categorized findings.  Musculoskeletal: Chronic sclerosis in the left proximal femoral metaphysis on image 84 of series 2 is unchanged from 2016 and considered benign.  IMPRESSION: 1. Bilateral nonobstructive nephrolithiasis. 2. Bilateral renal cysts. Probable mild complexity of the right kidney lower pole hypodense cystic lesion measuring 1.6 cm in long axis. 3. Aortic atherosclerosis.  Aortic Atherosclerosis (ICD10-I70.0).   Electronically Signed By: Gaylyn Rong M.D. On: 02/01/2021  11:39   Assessment & Plan:    1. Nephrolithiasis -BMP today, if his creatinine has normalized we will proceed with potassium citrate. He will followup in 2 weeks with a BMP - Urinalysis, Routine w reflex microscopic   No follow-ups on file.  Wilkie Aye, MD  [facility] [place]

## 2023-03-13 NOTE — Patient Instructions (Signed)

## 2023-03-14 ENCOUNTER — Telehealth: Payer: Self-pay

## 2023-03-14 LAB — BASIC METABOLIC PANEL
BUN/Creatinine Ratio: 17 (ref 10–24)
BUN: 26 mg/dL (ref 8–27)
CO2: 19 mmol/L — ABNORMAL LOW (ref 20–29)
Calcium: 9.6 mg/dL (ref 8.6–10.2)
Chloride: 106 mmol/L (ref 96–106)
Creatinine, Ser: 1.53 mg/dL — ABNORMAL HIGH (ref 0.76–1.27)
Glucose: 94 mg/dL (ref 70–99)
Potassium: 4.6 mmol/L (ref 3.5–5.2)
Sodium: 141 mmol/L (ref 134–144)
eGFR: 51 mL/min/{1.73_m2} — ABNORMAL LOW (ref >59)

## 2023-03-14 NOTE — Telephone Encounter (Signed)
-----   Message from Malen Gauze, MD sent at 03/14/2023  9:53 AM EDT ----- Creatinine improved. He can start UrocitK ----- Message ----- From: Gustavus Messing, LPN Sent: 02/25/8118   7:18 AM EDT To: Malen Gauze, MD  Please review

## 2023-03-15 IMAGING — CT CT HEAD W/O CM
4 series · 17 of 47 positions shown, 19 images · non-contrast
Comparison: 04/27/2020

CLINICAL DATA: Headache, new or worsening (Age >= 50y)



[Series 2: head wo · axial · 0.42mm/px · z∈[-410,-290]mm · 7 of 32 slices shown, 9 images]
[im 4/32  brain]
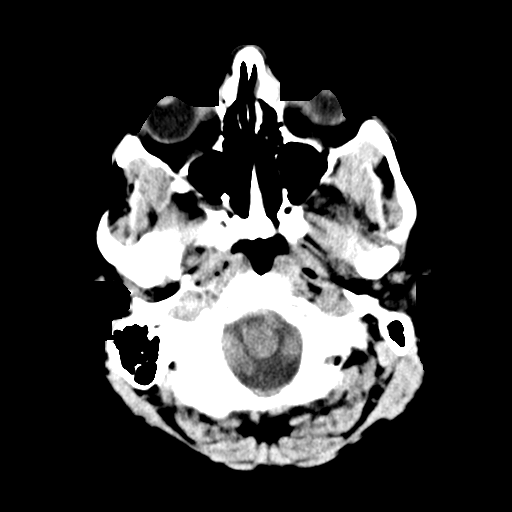
[im 4/32  bone]
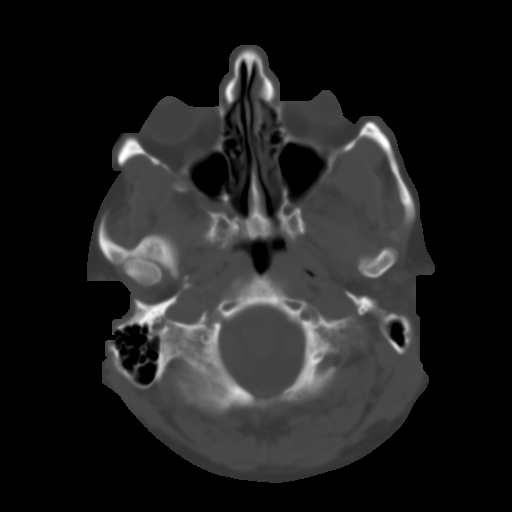
[im 8/32  brain]
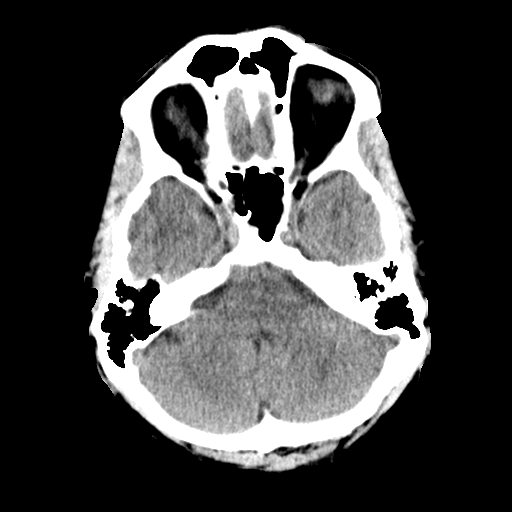
[im 12/32  brain]
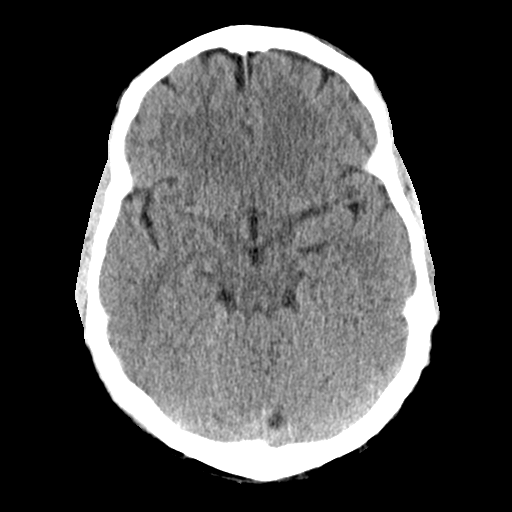
[im 16/32  brain]
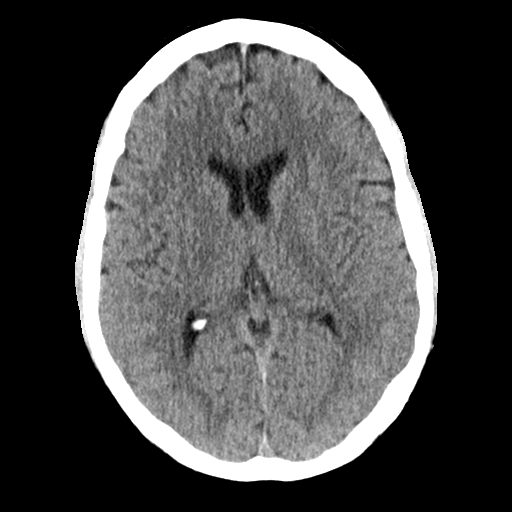
[im 20/32  brain]
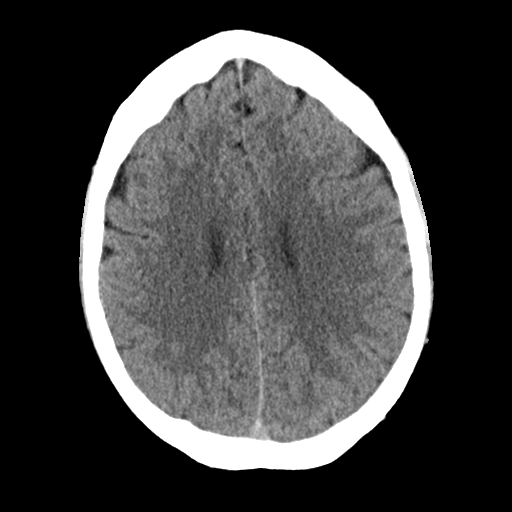
[im 20/32  bone]
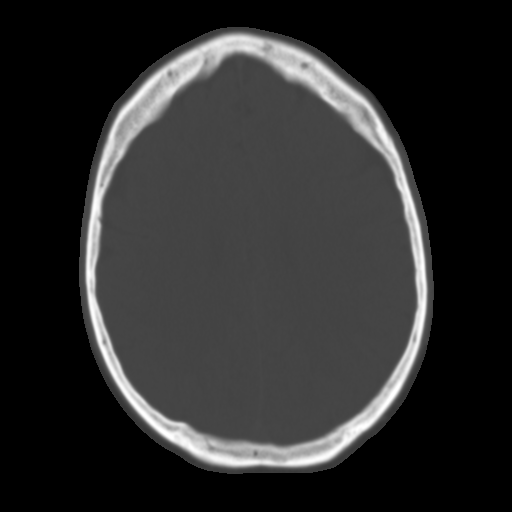
[im 24/32  brain]
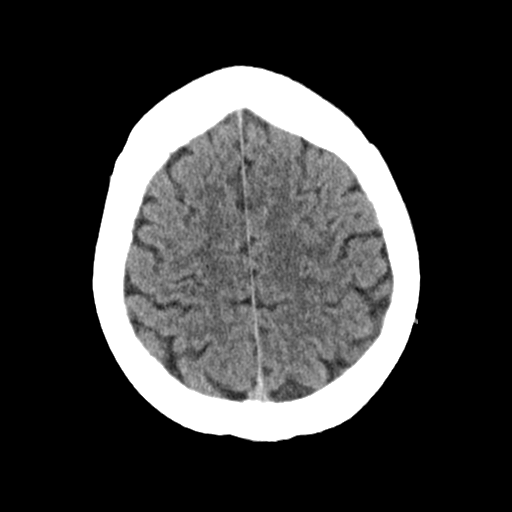
[im 28/32  brain]
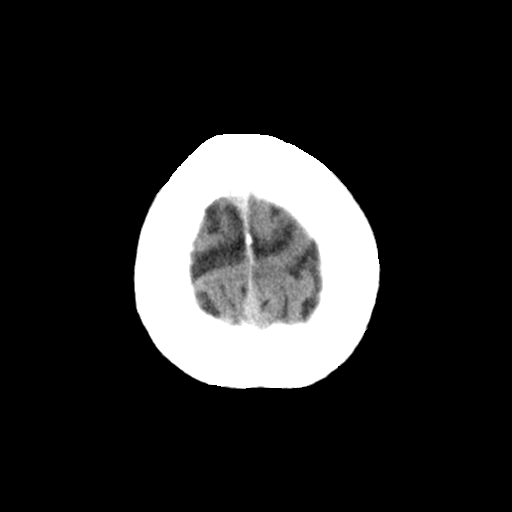

[Series 3: head bone · axial · 0.42mm/px · z∈[-411,-355]mm · 4 of 80 slices shown]
[im 8/80  bone]
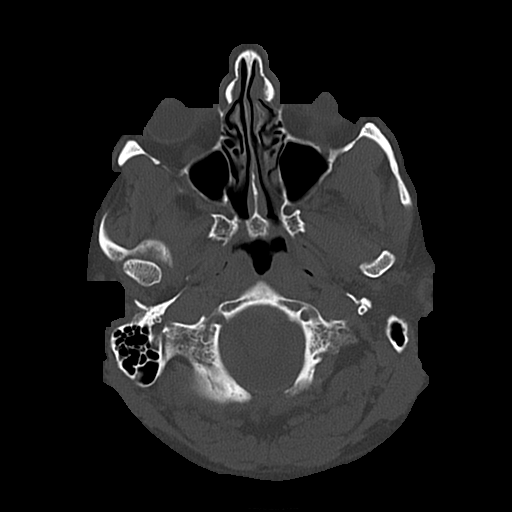
[im 16/80  bone]
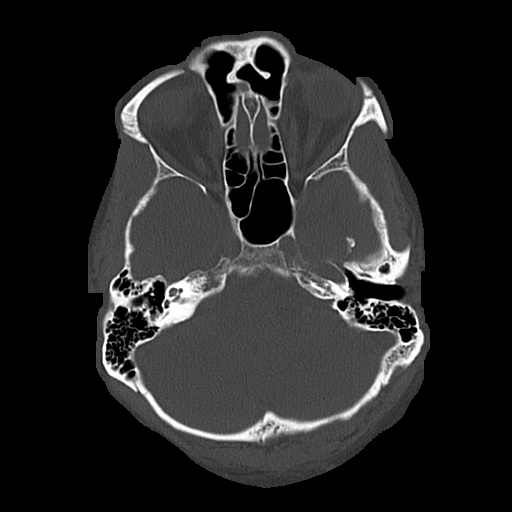
[im 24/80  bone]
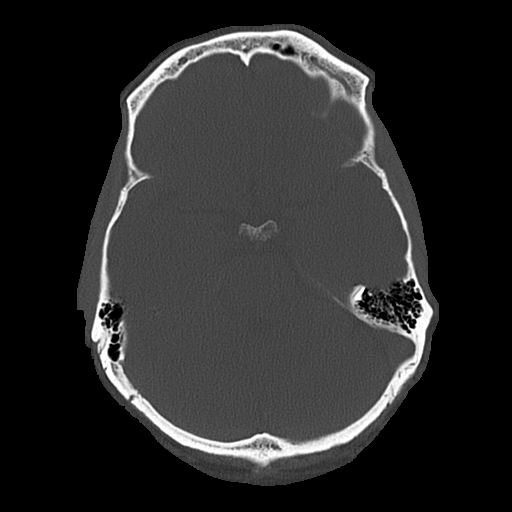
[im 36/80  bone]
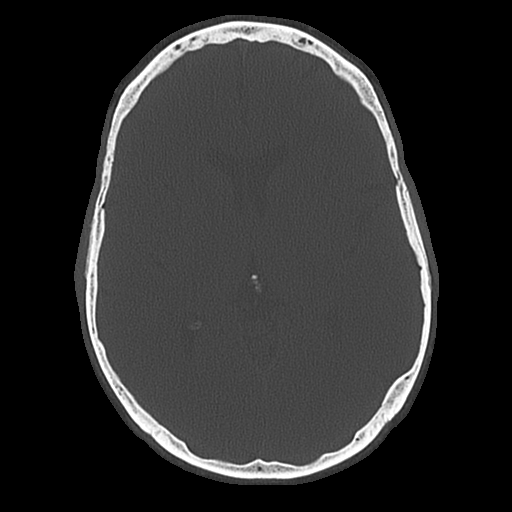

[Series 4: coronal soft · coronal · 0.36mm/px · 3 of 71 slices shown]
[im 24/71  brain]
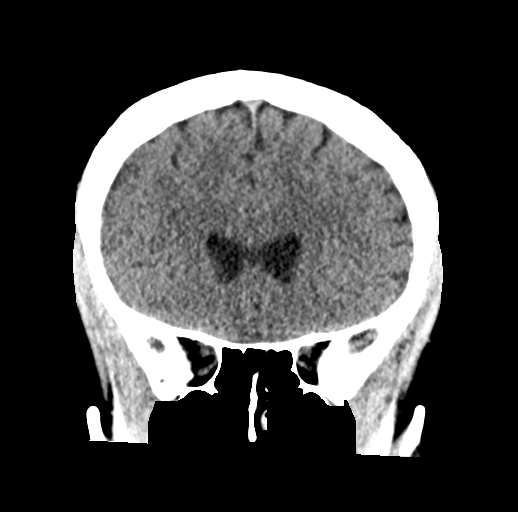
[im 32/71  brain]
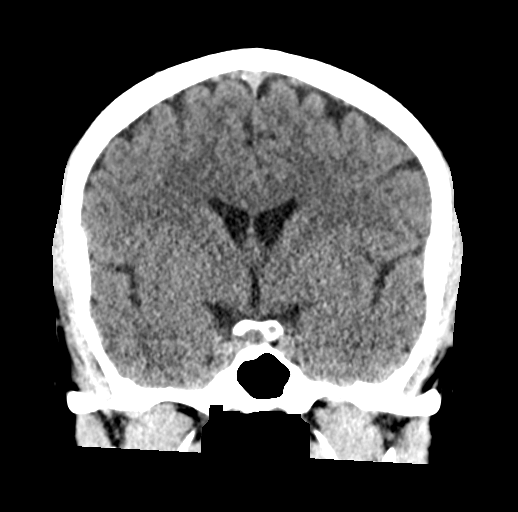
[im 39/71  brain]
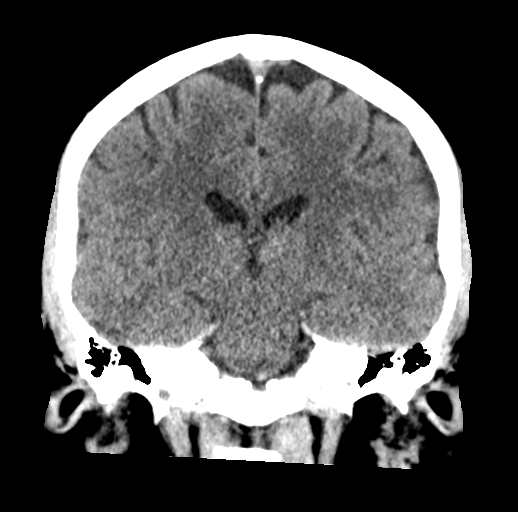

[Series 5: sagittal soft · sagittal · 0.36mm/px · 3 of 62 slices shown]
[im 21/62  brain]
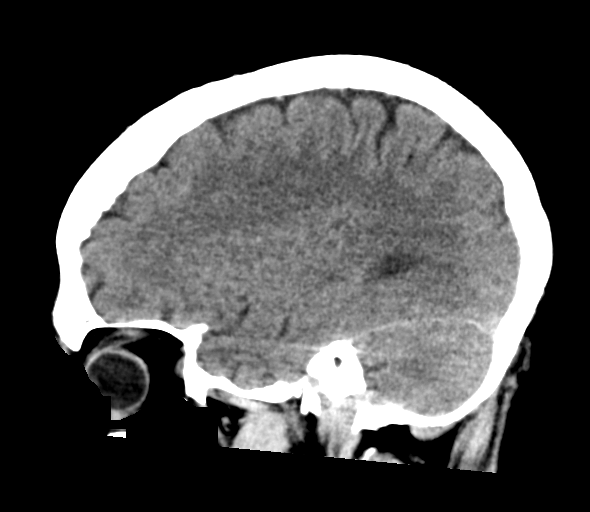
[im 31/62  brain]
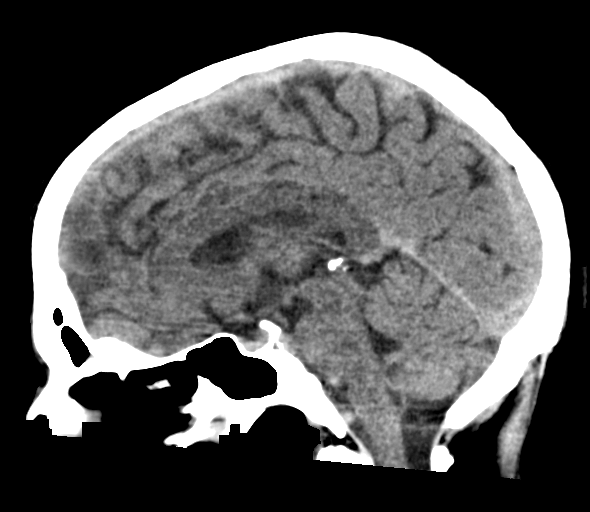
[im 41/62  brain]
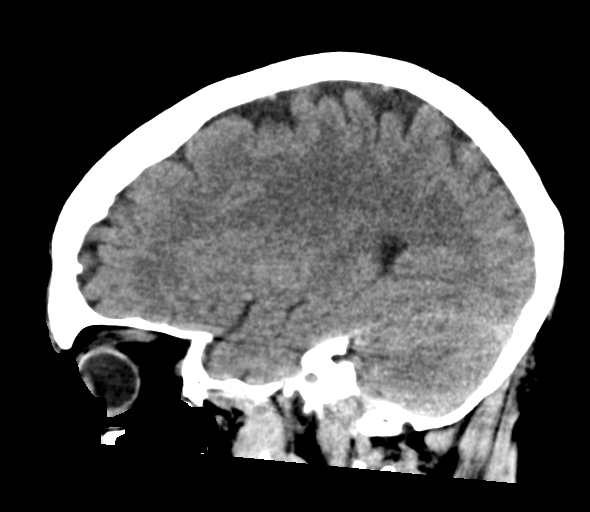

[17 of 47 positions shown; findings below may reference images not displayed]

FINDINGS: Brain: No acute intracranial abnormality. Specifically, no
hemorrhage, hydrocephalus, mass lesion, acute infarction, or
significant intracranial injury.

Vascular: No hyperdense vessel or unexpected calcification.

Skull: No acute calvarial abnormality.

Sinuses/Orbits: No acute findings

Other: None
IMPRESSION: Normal study.

## 2023-03-15 IMAGING — CT CT ANGIO CHEST-ABD-PELV FOR DISSECTION W/ AND WO/W CM
2 of 6 series · 14 of 46 positions shown, 16 images · IV contrast (APPLIED)
Comparison: 02/01/2021

CLINICAL DATA: Aortic aneurysm, known or suspected. Right upper
quadrant and flank pain

EXAM:
CT ANGIOGRAPHY CHEST, ABDOMEN AND PELVIS
TECHNIQUE: Non-contrast CT of the chest was initially obtained.

[Series 4: axial arterial · axial · arterial · 0.80mm/px · z∈[-1141,-525]mm · 11 of 344 slices shown, 13 images]
[im 18/344  soft-tissue]
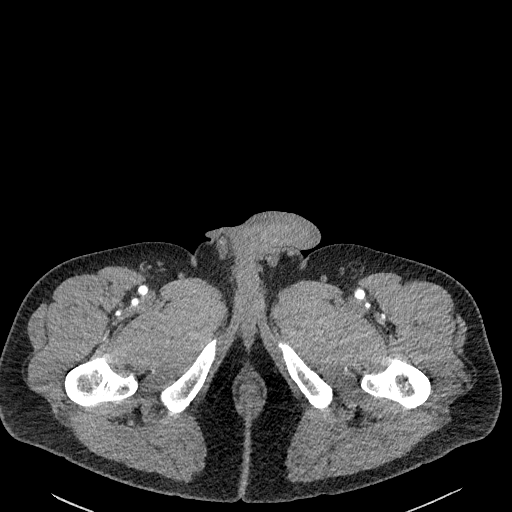
[im 18/344  bone]
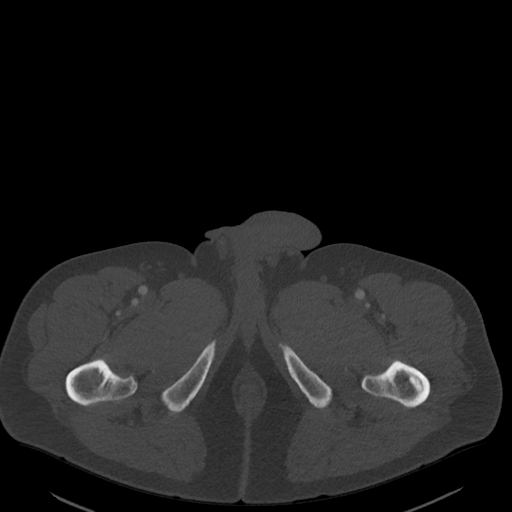
[im 52/344  soft-tissue]
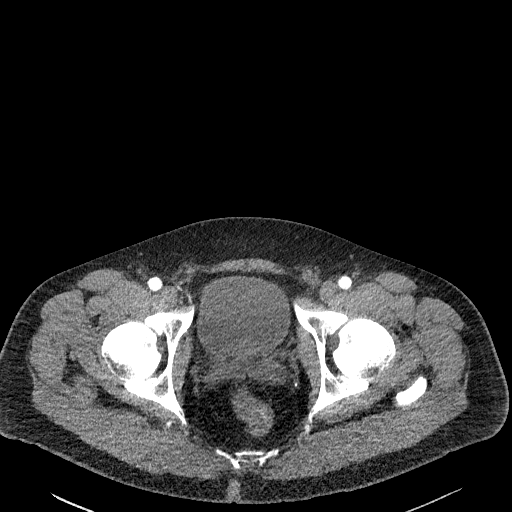
[im 86/344  soft-tissue]
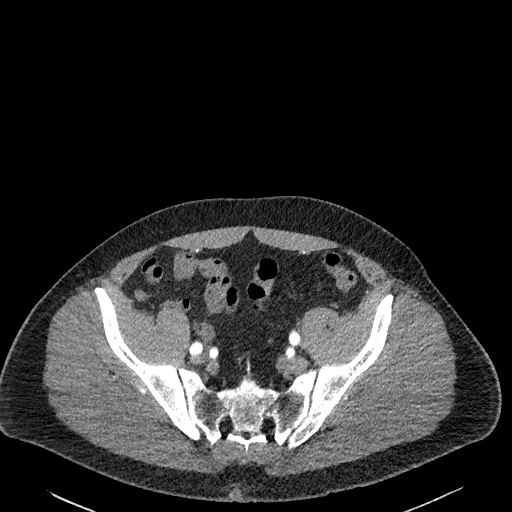
[im 121/344  soft-tissue]
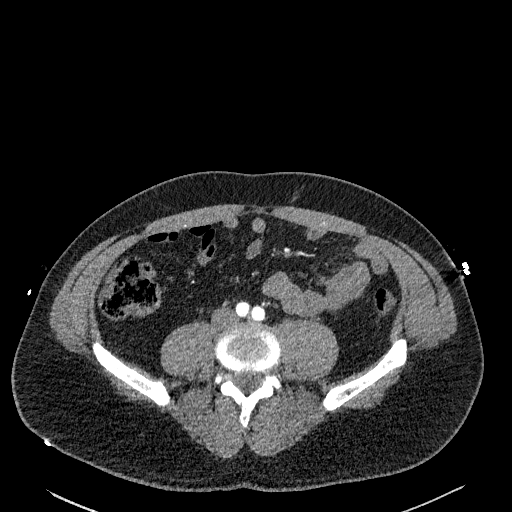
[im 138/344  soft-tissue]
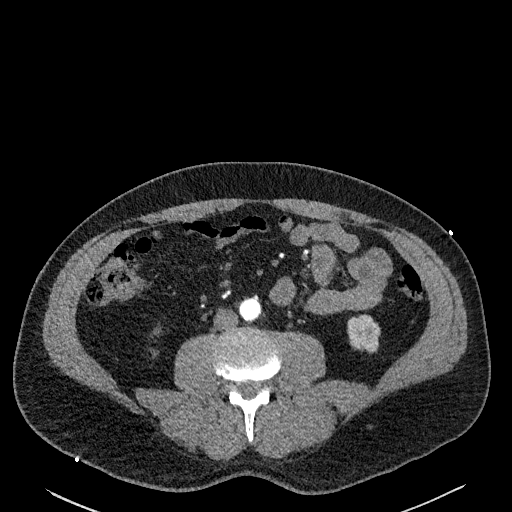
[im 172/344  soft-tissue]
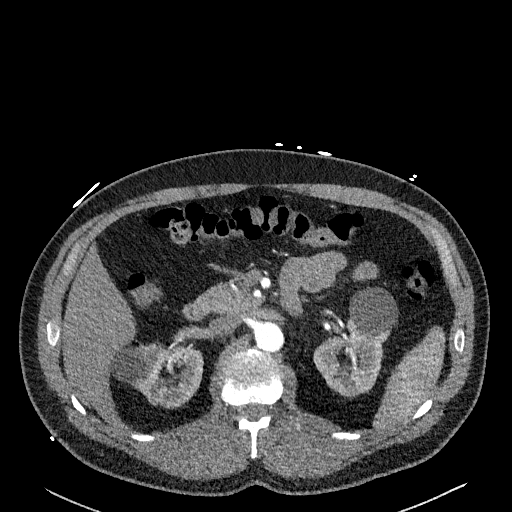
[im 206/344  soft-tissue]
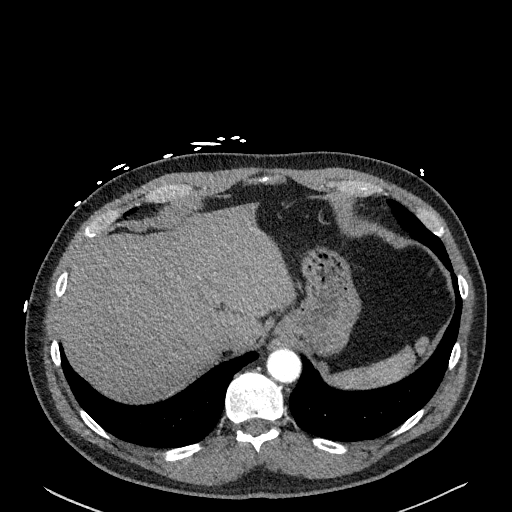
[im 223/344  soft-tissue]
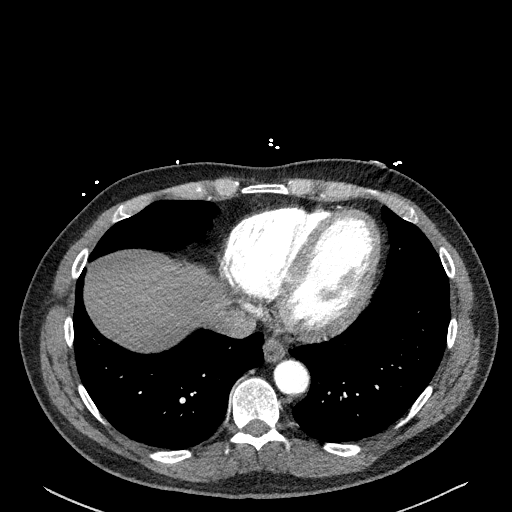
[im 258/344  soft-tissue]
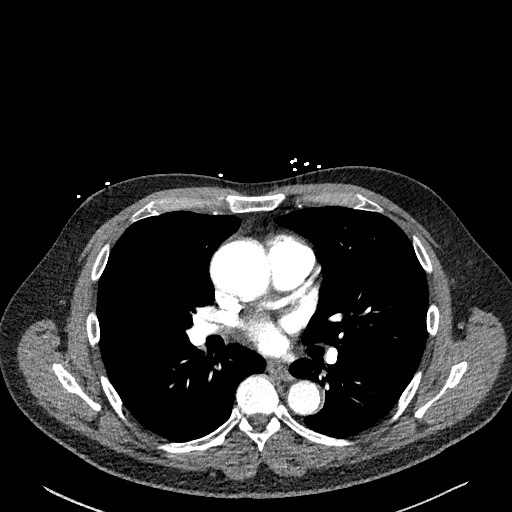
[im 258/344  bone]
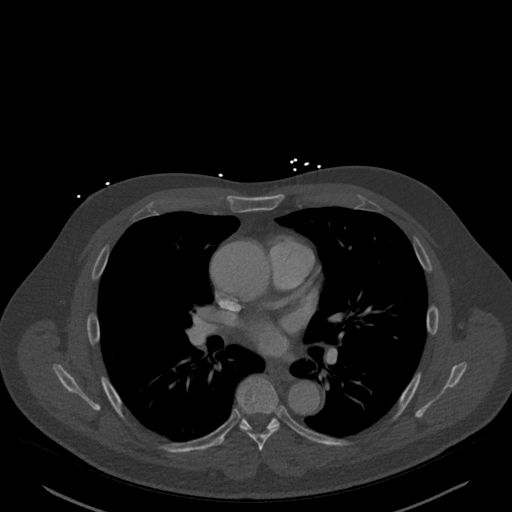
[im 292/344  soft-tissue]
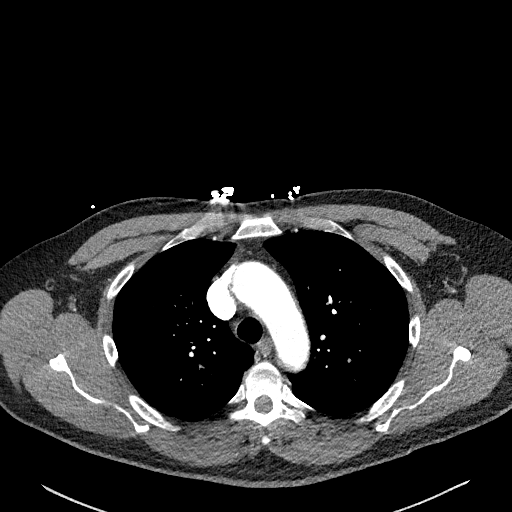
[im 326/344  soft-tissue]
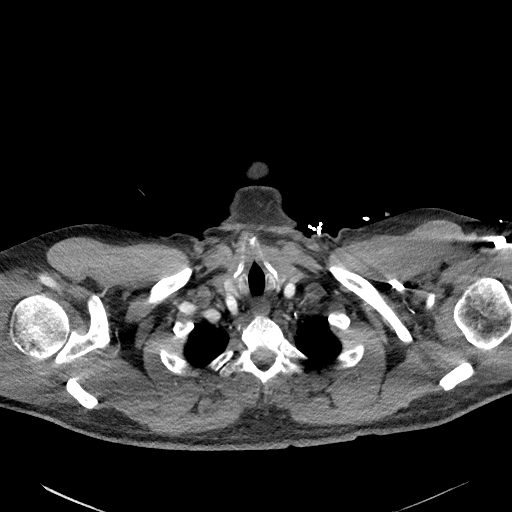

[Series 7: cor soft · coronal · 0.97mm/px · 3 of 149 slices shown]
[im 38/149  soft-tissue]
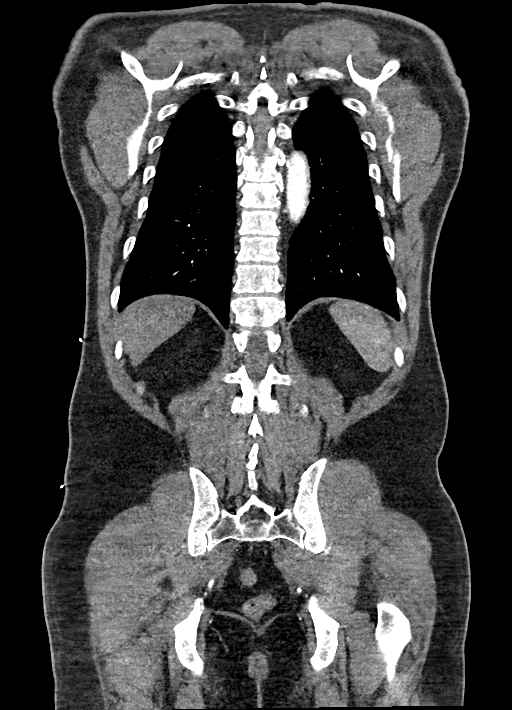
[im 75/149  soft-tissue]
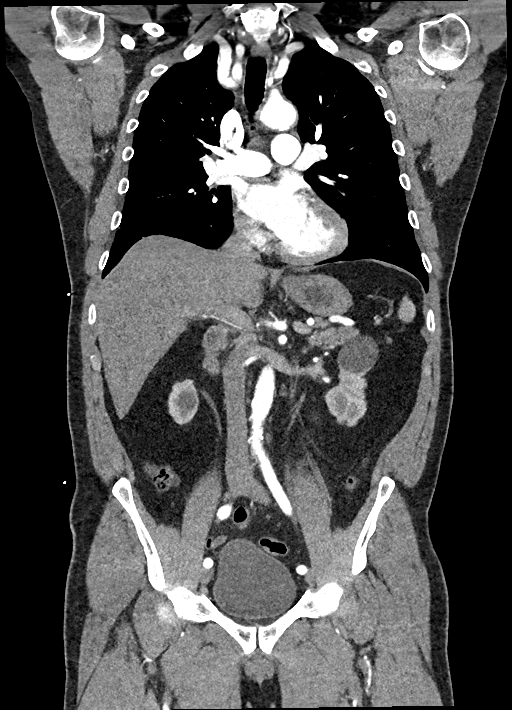
[im 112/149  soft-tissue]
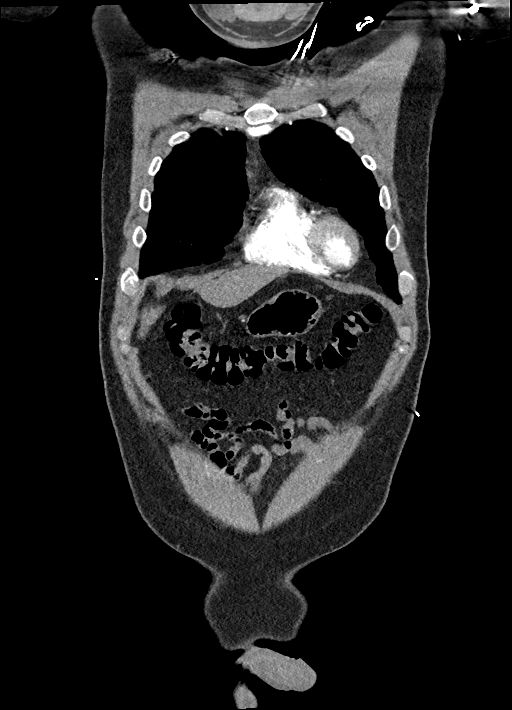

[14 of 46 positions shown; findings below may reference images not displayed]

Multidetector CT imaging through the chest, abdomen and pelvis was
performed using the standard protocol during bolus administration of
intravenous contrast. Multiplanar reconstructed images and MIPs were
obtained and reviewed to evaluate the vascular anatomy.

RADIATION DOSE REDUCTION: This exam was performed according to the
departmental dose-optimization program which includes automated
exposure control, adjustment of the mA and/or kV according to
patient size and/or use of iterative reconstruction technique.

CONTRAST:  100mL OMNIPAQUE IOHEXOL 350 MG/ML SOLN
FINDINGS: CTA CHEST FINDINGS

Cardiovascular: Aneurysmal dilatation of the ascending thoracic
aorta measuring up to 4.7 cm. No dissection. Scattered
calcifications. Heart is normal size.

Mediastinum/Nodes: No mediastinal, hilar, or axillary adenopathy.
Trachea and esophagus are unremarkable. Thyroid unremarkable.

Lungs/Pleura: Lungs are clear. No focal airspace opacities or
suspicious nodules. No effusions.

Musculoskeletal: Chest wall soft tissues are unremarkable. No acute
bony abnormality.

Review of the MIP images confirms the above findings.

CTA ABDOMEN AND PELVIS FINDINGS

VASCULAR

Aorta: Scattered aortic calcifications.  No aneurysm or dissection.

Celiac: Patent without evidence of aneurysm, dissection, vasculitis
or significant stenosis.

SMA: Patent without evidence of aneurysm, dissection, vasculitis or
significant stenosis.

Renals: Both renal arteries are patent without evidence of aneurysm,
dissection, vasculitis, fibromuscular dysplasia or significant
stenosis.

IMA: Patent without evidence of aneurysm, dissection, vasculitis or
significant stenosis.

Inflow: Patent without evidence of aneurysm, dissection, vasculitis
or significant stenosis.

Veins: No obvious venous abnormality within the limitations of this
arterial phase study.

Review of the MIP images confirms the above findings.

NON-VASCULAR

Hepatobiliary: No focal hepatic abnormality. Gallbladder
unremarkable.

Pancreas: No focal abnormality or ductal dilatation.

Spleen: No focal abnormality.  Normal size.

Adrenals/Urinary Tract: Bilateral renal cysts are stable since prior
study. No follow-up imaging recommended. 9 mm stone in the lower
pole of the left kidney. No ureteral stones or hydronephrosis.
Adrenal glands and urinary bladder unremarkable.

Stomach/Bowel: Stomach, large and small bowel grossly unremarkable.

Lymphatic: No adenopathy

Reproductive: Mildly prominent prostate with central calcifications.

Other: No free fluid or free air.

Musculoskeletal: No acute bony abnormality.

Review of the MIP images confirms the above findings.
IMPRESSION: 4.7 cm ascending thoracic aortic aneurysm. No evidence of
dissection. Recommend semi-annual imaging followup by CTA or MRA and
referral to cardiothoracic surgery if not already obtained. This
recommendation follows 1262
ACCF/AHA/AATS/ACR/ASA/SCA/ALENAZE/INESTROZA/HALAJ/LESYA Guidelines for the
Diagnosis and Management of Patients With Thoracic Aortic Disease.
Circulation. 1262; 121: E266-e369. Aortic aneurysm NOS ([id number])

No acute cardiopulmonary disease.

Left lower pole nephrolithiasis.

No acute findings in the abdomen or pelvis.

## 2023-03-19 ENCOUNTER — Encounter: Payer: Self-pay | Admitting: Urology

## 2023-03-22 NOTE — Telephone Encounter (Signed)
Patient is calling back to see what can be done to help before the weekend.  Please advise.

## 2023-03-22 NOTE — Telephone Encounter (Signed)
Return call to patient. Patient states that he is taking Allopurinol and potassium. Patient states that he has a swollen gland on the left side of his neck and if this is a side affect from Allopurinol and potassium. Patient is aware that a message will be sent to Dr. Ronne Binning and someone will reach out once MD review. Patient voiced understanding.

## 2023-03-26 ENCOUNTER — Telehealth: Payer: Self-pay

## 2023-03-26 NOTE — Telephone Encounter (Signed)
Patient called advising he has not taken the Potassium Citrate or the Sodium Bicarbonate  since Monday due to being on the road. He wanted to know if that would effect his BMP labs for tomorrow. He requested a call back at   [phone].   Thank you

## 2023-03-27 ENCOUNTER — Other Ambulatory Visit: Payer: Commercial Managed Care - HMO

## 2023-03-27 ENCOUNTER — Ambulatory Visit (HOSPITAL_COMMUNITY)
Admission: RE | Admit: 2023-03-27 | Discharge: 2023-03-27 | Disposition: A | Discharge status: Home / Self Care | Payer: Commercial Managed Care - HMO | Source: Ambulatory Visit | Attending: Urology | Admitting: Urology

## 2023-03-27 DIAGNOSIS — N200 Calculus of kidney: Secondary | ICD-10-CM

## 2023-03-27 NOTE — Telephone Encounter (Signed)
Patient is aware per Dr. Ronne Binning It should not be a side effect for either medication. Patient voiced understanding

## 2023-03-27 NOTE — Telephone Encounter (Signed)
Patient is in office wanting to let doctor know he has a lump on left side of neck. This is why he stopped medication.

## 2023-03-27 NOTE — Telephone Encounter (Signed)
FYI: see message below.  

## 2023-03-28 LAB — BASIC METABOLIC PANEL
BUN/Creatinine Ratio: 13 (ref 10–24)
BUN: 21 mg/dL (ref 8–27)
CO2: 22 mmol/L (ref 20–29)
Calcium: 9.2 mg/dL (ref 8.6–10.2)
Chloride: 105 mmol/L (ref 96–106)
Creatinine, Ser: 1.62 mg/dL — ABNORMAL HIGH (ref 0.76–1.27)
Glucose: 94 mg/dL (ref 70–99)
Potassium: 4.4 mmol/L (ref 3.5–5.2)
Sodium: 140 mmol/L (ref 134–144)
eGFR: 48 mL/min/{1.73_m2} — ABNORMAL LOW (ref >59)

## 2023-04-10 ENCOUNTER — Ambulatory Visit: Payer: Commercial Managed Care - HMO | Admitting: Urology

## 2023-05-13 ENCOUNTER — Ambulatory Visit (HOSPITAL_BASED_OUTPATIENT_CLINIC_OR_DEPARTMENT_OTHER): Payer: 59 | Admitting: "Endocrinology

## 2023-05-14 ENCOUNTER — Other Ambulatory Visit: Payer: Self-pay

## 2023-05-14 DIAGNOSIS — N200 Calculus of kidney: Secondary | ICD-10-CM

## 2023-05-15 ENCOUNTER — Ambulatory Visit (HOSPITAL_COMMUNITY)
Admission: RE | Admit: 2023-05-15 | Discharge: 2023-05-15 | Disposition: A | Discharge status: Home / Self Care | Payer: Commercial Managed Care - HMO | Source: Ambulatory Visit | Attending: Urology | Admitting: Urology

## 2023-05-15 ENCOUNTER — Ambulatory Visit (INDEPENDENT_AMBULATORY_CARE_PROVIDER_SITE_OTHER): Payer: Commercial Managed Care - HMO | Admitting: Urology

## 2023-05-15 DIAGNOSIS — N200 Calculus of kidney: Secondary | ICD-10-CM | POA: Insufficient documentation

## 2023-05-15 LAB — URINALYSIS, ROUTINE W REFLEX MICROSCOPIC
Bilirubin, UA: NEGATIVE
Glucose, UA: NEGATIVE
Ketones, UA: NEGATIVE
Leukocytes,UA: NEGATIVE
Nitrite, UA: NEGATIVE
Protein,UA: NEGATIVE
RBC, UA: NEGATIVE
Specific Gravity, UA: 1.02 (ref 1.005–1.030)
Urobilinogen, Ur: 0.2 mg/dL (ref 0.2–1.0)
pH, UA: 5.5 (ref 5.0–7.5)

## 2023-05-15 MED ORDER — POTASSIUM CITRATE ER 15 MEQ (1620 MG) PO TBCR: 1.0000 | EXTENDED_RELEASE_TABLET | Freq: Two times a day (BID) | ORAL | 11 refills | Status: DC

## 2023-05-15 NOTE — Progress Notes (Signed)
05/15/2023 10:54 AM   Alan Shepherd 01-10-1961 [id number]  Referring provider: No referring provider defined for this encounter.  Followup nephrolithiasis  HPI: [name] is a 62yo here for followup for nephrolithiasis. He was on UrocitK BID and allopurinol but has since stopped the medication due to joint pain. Urine pH 5.5. No stone events since last visit. KUB from today shows no definitive calculus. He has a hx of uric acid nephrolithiasis. Creatinine 1.37.    PMH: Past Medical History:  Diagnosis Date   Aneurysm of ascending aorta (HCC) 12/14/2020   Complication of anesthesia    excessive memory loss with versed   Environmental allergies    Family history of premature coronary artery disease    Hematuria    History of exercise intolerance    11-10-2013   ETT normal    History of kidney stones    Hypertension    primary cardiolgoist-  dr Eden Emms (lov in epic 12-15-2015)---  11-10-2013 ETT normal    Nephrolithiasis    per pt CT at dr Ronne Binning showed bilateral renal stones   Renal calculus, left    White coat syndrome with hypertension     Surgical History: Past Surgical History:  Procedure Laterality Date   CYSTOSCOPY W/ URETERAL STENT PLACEMENT Left 10/04/2017   Procedure: CYSTOSCOPY WITH LEFT  RETROGRADE PYELOGRAM/URETERAL STENT PLACEMENT;  Surgeon: Ihor Gully, MD;  Location: [facility];  Service: Urology;  Laterality: Left;   CYSTOSCOPY WITH RETROGRADE PYELOGRAM, URETEROSCOPY AND STENT PLACEMENT Left 12/09/2017   Procedure: CYSTOSCOPY WITH RETROGRADE PYELOGRAM, URETEROSCOPY AND STENT EXCHANGE, BASKET STONE RETRIVAL;  Surgeon: Malen Gauze, MD;  Location: [facility];  Service: Urology;  Laterality: Left;   CYSTOSCOPY WITH RETROGRADE PYELOGRAM, URETEROSCOPY AND STENT PLACEMENT Left 02/26/2023   Procedure: CYSTOSCOPY WITH RETROGRADE PYELOGRAM, URETEROSCOPY AND STENT PLACEMENT;  Surgeon: Malen Gauze, MD;  Location: [facility];  Service:  Urology;  Laterality: Left;   CYSTOSCOPY/RETROGRADE/URETEROSCOPY/STONE EXTRACTION WITH BASKET Bilateral left 04-06-2003;  right 10-08-2007   dr Stark Bray  [facility]   HOLMIUM LASER APPLICATION Left 12/09/2017   Procedure: HOLMIUM LASER APPLICATION;  Surgeon: Malen Gauze, MD;  Location: [facility];  Service: Urology;  Laterality: Left;   HOLMIUM LASER APPLICATION Left 02/26/2023   Procedure: HOLMIUM LASER APPLICATION;  Surgeon: Malen Gauze, MD;  Location: [facility];  Service: Urology;  Laterality: Left;    Home Medications:  Allergies as of 05/15/2023       Reactions   Bee Venom Anaphylaxis   Fire Ant Anaphylaxis   Fire Ant (solenopsis [place]) Anaphylaxis   Gramineae Pollens Anaphylaxis   Aspirin Itching   Bystolic [nebivolol Hcl]    Low HR, forgetful    Onion Itching   Versed [midazolam] Other (See Comments)   "Excessive memory loss for about six months"        Medication List        Accurate as of May 15, 2023 10:54 AM. If you have any questions, ask your nurse or doctor.          allopurinol 300 MG tablet Commonly known as: Zyloprim Take 1 tablet (300 mg total) by mouth daily.   amLODipine 5 MG tablet Commonly known as: NORVASC Take 1 tablet (5 mg total) by mouth daily.   ATHLETES FOOT EX Apply 1 spray topically daily as needed (athlete's foot).   EPINEPHrine 0.3 mg/0.3 mL Soaj injection Commonly known as: EPI-PEN Inject 0.3 mg into the muscle as needed  for anaphylaxis.   hydrALAZINE 25 MG tablet Commonly known as: APRESOLINE Take 1 tablet (25 mg total) by mouth in the morning and at bedtime.   ketorolac 10 MG tablet Commonly known as: TORADOL Take 1 tablet (10 mg total) by mouth every 6 (six) hours as needed.   ondansetron 4 MG tablet Commonly known as: Zofran Take 1 tablet (4 mg total) by mouth every 8 (eight) hours as needed for nausea or vomiting.   oxyCODONE-acetaminophen 5-325 MG tablet Commonly known as: Percocet Take 1  tablet by mouth every 4 (four) hours as needed.   Potassium Citrate 15 MEQ (1620 MG) Tbcr Commonly known as: Urocit-K 15 Take 1 tablet by mouth 2 (two) times daily.   sodium bicarbonate 650 MG tablet Take 1 tablet (650 mg total) by mouth 2 (two) times daily.   sulfamethoxazole-trimethoprim 800-160 MG tablet Commonly known as: BACTRIM DS Take 1 tablet by mouth every 12 (twelve) hours.   tamsulosin 0.4 MG Caps capsule Commonly known as: FLOMAX Take 1 capsule (0.4 mg total) by mouth daily.        Allergies:  Allergies  Allergen Reactions   Bee Venom Anaphylaxis   Fire Ant Anaphylaxis   Animator (Solenopsis [place]) Anaphylaxis   Gramineae Pollens Anaphylaxis   Aspirin Itching   Bystolic [Nebivolol Hcl]     Low HR, forgetful    Onion Itching   Versed [Midazolam] Other (See Comments)    "Excessive memory loss for about six months"    Family History: Family History  Problem Relation Age of Onset   Hypertension Father    Hypertension Sister    Hypertension Sister     Social History:  reports that he has never smoked. He has never used smokeless tobacco. He reports that he does not drink alcohol and does not use drugs.  ROS: All other review of systems were reviewed and are negative except what is noted above in HPI  Physical Exam: There were no vitals taken for this visit.  Constitutional:  Alert and oriented, No acute distress. HEENT: [place] AT, moist mucus membranes.  Trachea midline, no masses. Cardiovascular: No clubbing, cyanosis, or edema. Respiratory: Normal respiratory effort, no increased work of breathing. GI: Abdomen is soft, nontender, nondistended, no abdominal masses GU: No CVA tenderness.  Lymph: No cervical or inguinal lymphadenopathy. Skin: No rashes, bruises or suspicious lesions. Neurologic: Grossly intact, no focal deficits, moving all 4 extremities. Psychiatric: Normal mood and affect.  Laboratory Data: Lab Results  Component Value Date   WBC  6.3 02/20/2023   HGB 11.8 (L) 02/20/2023   HCT 37.9 (L) 02/20/2023   MCV 66.8 (L) 02/20/2023   PLT 206 02/20/2023    Lab Results  Component Value Date   CREATININE 1.62 (H) 03/27/2023    Lab Results  Component Value Date   PSA 0.8 04/04/2017    No results found for: "TESTOSTERONE"  No results found for: "HGBA1C"  Urinalysis    Component Value Date/Time   COLORURINE COLORLESS (A) 05/03/2022 1624   APPEARANCEUR Clear 03/13/2023 1053   LABSPEC <1.005 (L) 05/03/2022 1624   PHURINE 5.5 05/03/2022 1624   GLUCOSEU Negative 03/13/2023 1053   GLUCOSEU NEGATIVE 11/06/2017 1151   HGBUR NEGATIVE 05/03/2022 1624   BILIRUBINUR Negative 03/13/2023 1053   KETONESUR NEGATIVE 05/03/2022 1624   PROTEINUR Negative 03/13/2023 1053   PROTEINUR NEGATIVE 05/03/2022 1624   UROBILINOGEN 0.2 11/06/2017 1151   NITRITE Negative 03/13/2023 1053   NITRITE NEGATIVE 05/03/2022 1624   LEUKOCYTESUR Negative  03/13/2023 1053   LEUKOCYTESUR NEGATIVE 05/03/2022 1624    Lab Results  Component Value Date   LABMICR Comment 03/13/2023   WBCUA 0-5 10/05/2022   LABEPIT 0-10 10/05/2022   MUCUS Present 05/14/2022   BACTERIA Few (A) 10/05/2022    Pertinent Imaging: KUB today: Images reviewed and discussed with the patient  Results for orders placed in visit on 03/13/23  DG Abd 1 View  Narrative CLINICAL DATA:  RIGHT-side abdominal pain, history of kidney stones and lithotripsy  EXAM: ABDOMEN - 1 VIEW  COMPARISON:  02/11/2023  FINDINGS: Pelvic phleboliths stable.  No urinary tract calcifications identified.  Osseous structures unremarkable.  Bowel gas pattern normal.  IMPRESSION: No urinary tract calcifications.   Electronically Signed By: Ulyses Southward M.D. On: 03/14/2023 16:14  No results found for this or any previous visit.  No results found for this or any previous visit.  No results found for this or any previous visit.  Results for orders placed during the hospital  encounter of 03/27/23  Ultrasound renal complete  Narrative CLINICAL DATA:  History of renal stone  EXAM: RENAL / URINARY TRACT ULTRASOUND COMPLETE  COMPARISON:  None Available.  FINDINGS: Right Kidney:  Renal measurements: 11.6 x 5.5 x 5.1 cm = volume: 172 mL. Contains a 3.3 cm cyst. No follow-up imaging recommended for the cyst.  Left Kidney:  Renal measurements: 11.4 x 5.1 x 5.7 cm = volume: 172 mL. Contains a 4.5 cm cyst. No follow-up imaging recommended for the cyst. Contains a 9 mm oval echogenic structure in the lower pole with shadowing most likely a stone.  Bladder:  Appears normal for degree of bladder distention.  Other:  None.  IMPRESSION: 9 mm oval echogenic structure in the lower pole of the left kidney with shadowing most likely a stone. No other abnormalities.   Electronically Signed By: Gerome Sam III M.D. On: 03/27/2023 13:18  No valid procedures specified. No results found for this or any previous visit.  Results for orders placed in visit on 02/01/21  CT RENAL STONE STUDY  Narrative CLINICAL DATA:  Right flank pain since 01/25/2021 with chills. History of renal calculi.  EXAM: CT ABDOMEN AND PELVIS WITHOUT CONTRAST  TECHNIQUE: Multidetector CT imaging of the abdomen and pelvis was performed following the standard protocol without IV contrast.  COMPARISON:  Multiple exams, including renal ultrasound from 01/31/2021  FINDINGS: Lower chest: Mild descending thoracic aortic atherosclerotic calcification.  Hepatobiliary: Unremarkable  Pancreas: Unremarkable  Spleen: Unremarkable  Adrenals/Urinary Tract: Both adrenal glands appear normal. Fluid density 3.4 by 3.2 cm right mid kidney cyst, image 27 series 2. Fluid density 3.3 by 3.5 cm left mid kidney cyst anteriorly on image 24 series 2. Hypodense 1.5 by 1.6 cm lesion in the right kidney lower pole anteriorly has fluid density favoring a cyst, although faintly indistinct  margins suggesting some minimal complexity which was also suggested by the sonographic appearance on yesterday's ultrasound. Strictly speaking a Bosniak classification cannot be assigned due to the lack of IV contrast, but this is most likely to be a complex cyst.  2 mm right kidney lower pole nonobstructive renal calculus.  There are bed 8 nonobstructive left renal calculi, the larger calculi include a 5 mm lower pole calculus on image 58 series 5 and a 5 mm upper pole calculus on image 63 series 5.  No hydronephrosis or hydroureter. No ureteral or bladder calculi noted.  Stomach/Bowel: Unremarkable  Vascular/Lymphatic: Aortoiliac atherosclerotic vascular disease.  Reproductive: Unremarkable  Other: No  supplemental non-categorized findings.  Musculoskeletal: Chronic sclerosis in the left proximal femoral metaphysis on image 84 of series 2 is unchanged from 2016 and considered benign.  IMPRESSION: 1. Bilateral nonobstructive nephrolithiasis. 2. Bilateral renal cysts. Probable mild complexity of the right kidney lower pole hypodense cystic lesion measuring 1.6 cm in long axis. 3. Aortic atherosclerosis.  Aortic Atherosclerosis (ICD10-I70.0).   Electronically Signed By: Gaylyn Rong M.D. On: 02/01/2021 11:39   Assessment & Plan:    1. Nephrolithiasis -We discussed the management of kidney stones. These options include observation, ureteroscopy, shockwave lithotripsy (ESWL) and percutaneous nephrolithotomy (PCNL). We discussed which options are relevant to the patient's stone(s). We discussed the natural history of kidney stones as well as the complications of untreated stones and the impact on quality of life without treatment as well as with each of the above listed treatments. We also discussed the efficacy of each treatment in its ability to clear the stone burden. With any of these management options I discussed the signs and symptoms of infection and the need for  emergent treatment should these be experienced. For each option we discussed the ability of each procedure to clear the patient of their stone burden.   For observation I described the risks which include but are not limited to silent renal damage, life-threatening infection, need for emergent surgery, failure to pass stone and pain.   For ureteroscopy I described the risks which include bleeding, infection, damage to contiguous structures, positioning injury, ureteral stricture, ureteral avulsion, ureteral injury, need for prolonged ureteral stent, inability to perform ureteroscopy, need for an interval procedure, inability to clear stone burden, stent discomfort/pain, heart attack, stroke, pulmonary embolus and the inherent risks with general anesthesia.   For shockwave lithotripsy I described the risks which include arrhythmia, kidney contusion, kidney hemorrhage, need for transfusion, pain, inability to adequately break up stone, inability to pass stone fragments, Steinstrasse, infection associated with obstructing stones, need for alternate surgical procedure, need for repeat shockwave lithotripsy, MI, CVA, PE and the inherent risks with anesthesia/conscious sedation.   For PCNL I described the risks including positioning injury, pneumothorax, hydrothorax, need for chest tube, inability to clear stone burden, renal laceration, arterial venous fistula or malformation, need for embolization of kidney, loss of kidney or renal function, need for repeat procedure, need for prolonged nephrostomy tube, ureteral avulsion, MI, CVA, PE and the inherent risks of general anesthesia.   - The patient would like to proceed observation. We will restart urocitK BID. We will not continue allopurinol.    No follow-ups on file.  Wilkie Aye, MD  [facility] [place]

## 2023-05-19 ENCOUNTER — Encounter: Payer: Self-pay | Admitting: Urology

## 2023-05-19 NOTE — Patient Instructions (Signed)

## 2023-05-24 ENCOUNTER — Other Ambulatory Visit: Payer: Self-pay | Admitting: Urology

## 2023-09-19 ENCOUNTER — Other Ambulatory Visit: Payer: Managed Care, Other (non HMO)

## 2023-09-19 DIAGNOSIS — N3001 Acute cystitis with hematuria: Secondary | ICD-10-CM

## 2023-09-19 DIAGNOSIS — N200 Calculus of kidney: Secondary | ICD-10-CM

## 2023-09-19 LAB — URINALYSIS, ROUTINE W REFLEX MICROSCOPIC
Bilirubin, UA: NEGATIVE
Glucose, UA: NEGATIVE
Ketones, UA: NEGATIVE
Leukocytes,UA: NEGATIVE
Nitrite, UA: NEGATIVE
Protein,UA: NEGATIVE
Specific Gravity, UA: 1.02 (ref 1.005–1.030)
Urobilinogen, Ur: 0.2 mg/dL (ref 0.2–1.0)
pH, UA: 6 (ref 5.0–7.5)

## 2023-09-19 LAB — MICROSCOPIC EXAMINATION
Bacteria, UA: NONE SEEN
WBC, UA: NONE SEEN /[HPF] (ref 0–5)

## 2023-09-20 LAB — COMPREHENSIVE METABOLIC PANEL
ALT: 17 [IU]/L (ref 0–44)
AST: 27 [IU]/L (ref 0–40)
Albumin: 5 g/dL — ABNORMAL HIGH (ref 3.9–4.9)
Alkaline Phosphatase: 77 [IU]/L (ref 44–121)
BUN/Creatinine Ratio: 12 (ref 10–24)
BUN: 19 mg/dL (ref 8–27)
Bilirubin Total: 0.5 mg/dL (ref 0.0–1.2)
CO2: 22 mmol/L (ref 20–29)
Calcium: 9.8 mg/dL (ref 8.6–10.2)
Chloride: 106 mmol/L (ref 96–106)
Creatinine, Ser: 1.59 mg/dL — ABNORMAL HIGH (ref 0.76–1.27)
Globulin, Total: 2.5 g/dL (ref 1.5–4.5)
Glucose: 90 mg/dL (ref 70–99)
Potassium: 4.6 mmol/L (ref 3.5–5.2)
Sodium: 142 mmol/L (ref 134–144)
Total Protein: 7.5 g/dL (ref 6.0–8.5)
eGFR: 49 mL/min/{1.73_m2} — ABNORMAL LOW (ref >59)

## 2023-10-21 ENCOUNTER — Telehealth: Payer: Self-pay | Admitting: Gastroenterology

## 2023-10-21 NOTE — Telephone Encounter (Signed)
Mindy:  Patient requesting to see me Thursday. Can you change his appt from [place] to me at 0830? Thanks!

## 2023-10-21 NOTE — Telephone Encounter (Signed)
Pt changed and Anna aware. She will inform patient

## 2023-10-24 ENCOUNTER — Ambulatory Visit (HOSPITAL_COMMUNITY)
Admission: RE | Admit: 2023-10-24 | Discharge: 2023-10-24 | Disposition: A | Discharge status: Home / Self Care | Payer: Commercial Managed Care - HMO | Source: Ambulatory Visit | Attending: Urology | Admitting: Urology

## 2023-10-24 ENCOUNTER — Encounter: Payer: Self-pay | Admitting: Gastroenterology

## 2023-10-24 ENCOUNTER — Ambulatory Visit: Payer: Non-veteran care | Admitting: Gastroenterology

## 2023-10-24 ENCOUNTER — Ambulatory Visit (INDEPENDENT_AMBULATORY_CARE_PROVIDER_SITE_OTHER): Payer: No Typology Code available for payment source | Admitting: Gastroenterology

## 2023-10-24 VITALS — Temp 98.6°F | Ht 71.0 in | Wt 214.0 lb

## 2023-10-24 DIAGNOSIS — N200 Calculus of kidney: Secondary | ICD-10-CM | POA: Insufficient documentation

## 2023-10-24 DIAGNOSIS — K5909 Other constipation: Secondary | ICD-10-CM

## 2023-10-24 DIAGNOSIS — K5900 Constipation, unspecified: Secondary | ICD-10-CM | POA: Insufficient documentation

## 2023-10-24 DIAGNOSIS — K625 Hemorrhage of anus and rectum: Secondary | ICD-10-CM | POA: Diagnosis not present

## 2023-10-24 NOTE — Patient Instructions (Signed)
We are arranging a colonoscopy in the near future.  I recommend Benefiber each morning and Miralax 1 capful in 8 ounces of water (you can take the MIralax in evening if needed if this works better for your schedule).  We will see you back after the colonoscopy!  It was a pleasure to see you today. I want to create trusting relationships with patients and provide genuine, compassionate, and quality care. I truly value your feedback, so please be on the lookout for a survey regarding your visit with me today. I appreciate your time in completing this!         Gelene Mink, PhD, ANP-BC [facility]

## 2023-10-24 NOTE — Progress Notes (Signed)
Gastroenterology Office Note    Referring Provider: Byrd Hesselbach, PA Primary Care Physician:  Byrd Hesselbach, PA  Primary GI: Dr. Levon Hedger   Chief Complaint   Chief Complaint  Patient presents with   New Patient (Initial Visit)    Pt referred for GERD and Dysphagia     History of Present Illness   Alan Shepherd is a very pleasant 62 y.o. male presenting today at the request of Ankabrandt, Irving Burton, [place] to establish GI care. Past medical history pertinent for AAA, renal stones, HTN, significant white coat syndrome with HTN.   He is denying any concerns with GERD or dysphagia historically or at this time.   He notes his biggest concern is chronic constipation and seeing occasional blood per rectum. He has history of renal stones and is on potassium citrate but denies epigastric pain, melena, etc.   Chronic constipation. Strains at times and has seen brbrpr. Prescibred miralax and metamucil from the [place]. Needs more productive stool. RLQ twinges occasionally. Changed diet. Sometimes rectal itching/burning.    Past Medical History:  Diagnosis Date   Aneurysm of ascending aorta (HCC) 12/14/2020   Complication of anesthesia    excessive memory loss with versed   Environmental allergies    Family history of premature coronary artery disease    Hematuria    History of exercise intolerance    11-10-2013   ETT normal    History of kidney stones    Hypertension    primary cardiolgoist-  dr Eden Emms (lov in epic 12-15-2015)---  11-10-2013 ETT normal    Nephrolithiasis    per pt CT at dr Ronne Binning showed bilateral renal stones   Renal calculus, left    White coat syndrome with hypertension     Past Surgical History:  Procedure Laterality Date   CYSTOSCOPY W/ URETERAL STENT PLACEMENT Left 10/04/2017   Procedure: CYSTOSCOPY WITH LEFT  RETROGRADE PYELOGRAM/URETERAL STENT PLACEMENT;  Surgeon: Ihor Gully, MD;  Location: [facility];  Service: Urology;  Laterality: Left;    CYSTOSCOPY WITH RETROGRADE PYELOGRAM, URETEROSCOPY AND STENT PLACEMENT Left 12/09/2017   Procedure: CYSTOSCOPY WITH RETROGRADE PYELOGRAM, URETEROSCOPY AND STENT EXCHANGE, BASKET STONE RETRIVAL;  Surgeon: Malen Gauze, MD;  [facility];  Service: Urology;  Laterality: Left;   CYSTOSCOPY WITH RETROGRADE PYELOGRAM, URETEROSCOPY AND STENT PLACEMENT Left 02/26/2023   Procedure: CYSTOSCOPY WITH RETROGRADE PYELOGRAM, URETEROSCOPY AND STENT PLACEMENT;  Surgeon: Malen Gauze, MD;  Location: [facility];  Service: Urology;  Laterality: Left;   CYSTOSCOPY/RETROGRADE/URETEROSCOPY/STONE EXTRACTION WITH BASKET Bilateral left 04-06-2003;  right 10-08-2007   dr Stark Bray  [facility]   HOLMIUM LASER APPLICATION Left 12/09/2017   Procedure: HOLMIUM LASER APPLICATION;  Surgeon: Malen Gauze, MD;  Location: [facility];  Service: Urology;  Laterality: Left;   HOLMIUM LASER APPLICATION Left 02/26/2023   Procedure: HOLMIUM LASER APPLICATION;  Surgeon: Malen Gauze, MD;  Location: [facility];  Service: Urology;  Laterality: Left;    Current Outpatient Medications  Medication Sig Dispense Refill   EPINEPHrine 0.3 mg/0.3 mL IJ SOAJ injection Inject 0.3 mg into the muscle as needed for anaphylaxis.     Miconazole Nitrate (ATHLETES FOOT EX) Apply 1 spray topically daily as needed (athlete's foot).     Potassium Citrate (UROCIT-K 15) 15 MEQ (1620 MG) TBCR Take 1 tablet by mouth 2 (two) times daily. 60 tablet 11   tamsulosin (FLOMAX) 0.4 MG CAPS capsule Take 1 capsule (0.4 mg total) by mouth daily. 30 capsule  11   amLODipine (NORVASC) 5 MG tablet Take 1 tablet (5 mg total) by mouth daily. (Patient not taking: Reported on 10/24/2023) 30 tablet 0   hydrALAZINE (APRESOLINE) 25 MG tablet Take 1 tablet (25 mg total) by mouth in the morning and at bedtime. (Patient not taking: Reported on 02/20/2023) 60 tablet 5   sodium bicarbonate 650 MG tablet TAKE 1 TABLET BY MOUTH 2 TIMES DAILY.  (Patient not taking: Reported on 10/24/2023) 180 tablet 1   No current facility-administered medications for this visit.   Facility-Administered Medications Ordered in Other Visits  Medication Dose Route Frequency Provider Last Rate Last Admin   0.9 %  sodium chloride infusion   Intravenous Continuous [name], Mardene Celeste, MD       diazepam (VALIUM) tablet 10 mg  10 mg Oral Once Malen Gauze, MD        Allergies as of 10/24/2023 - Review Complete 10/24/2023  Allergen Reaction Noted   Bee venom Anaphylaxis 11/13/2017   Fire ant Anaphylaxis 11/13/2017   Fire ant (solenopsis invicta) Anaphylaxis 11/13/2017   Gramineae pollens Anaphylaxis 11/13/2017   Aspirin Itching 04/14/2012   Bystolic [nebivolol hcl]  01/30/2021   Onion Itching 11/13/2017   Versed [midazolam] Other (See Comments) 10/04/2017    Family History  Problem Relation Age of Onset   Hypertension Father    Hypertension Sister    Hypertension Sister     Social History   Socioeconomic History   Marital status: Divorced    Spouse name: Not on file   Number of children: Not on file   Years of education: Not on file   Highest education level: Not on file  Occupational History   Not on file  Tobacco Use   Smoking status: Never   Smokeless tobacco: Never  Vaping Use   Vaping status: Never Used  Substance and Sexual Activity   Alcohol use: No   Drug use: No   Sexual activity: Not on file  Other Topics Concern   Not on file  Social History Narrative   Not on file   Social Determinants of Health   Financial Resource Strain: Not on file  Food Insecurity: Not on file  Transportation Needs: Not on file  Physical Activity: Not on file  Stress: Not on file  Social Connections: Unknown (04/02/2022)   [facility] from [facility]   Social Network    Social Network: Not on file  Intimate Partner Violence: Unknown (02/22/2022)   Received from [facility]   HITS    Physically  Hurt: Not on file    Insult or Talk Down To: Not on file    Threaten Physical Harm: Not on file    Scream or Curse: Not on file     Review of Systems   Gen: Denies any fever, chills, fatigue, weight loss, lack of appetite.  CV: Denies chest pain, heart palpitations, peripheral edema, syncope.  Resp: Denies shortness of breath at rest or with exertion. Denies wheezing or cough.  GI: Denies dysphagia or odynophagia. Denies jaundice, hematemesis, fecal incontinence. GU : Denies urinary burning, urinary frequency, urinary hesitancy MS: Denies joint pain, muscle weakness, cramps, or limitation of movement.  Derm: Denies rash, itching, dry skin Psych: Denies depression, anxiety, memory loss, and confusion Heme: Denies bruising, bleeding, and enlarged lymph nodes.   Physical Exam   Temp 98.6 F (37 C)   Ht 5\' 11"  (1.803 m)   Wt 214 lb (97.1 kg)   BMI  29.85 kg/m  General:   Alert and oriented. Pleasant and cooperative. Well-nourished and well-developed.  Head:  Normocephalic and atraumatic. Eyes:  Without icterus Ears:  Normal auditory acuity. Lungs:  Clear to auscultation bilaterally.  Heart:  S1, S2 present without murmurs appreciated.  Abdomen:  +BS, soft, non-tender and non-distended. No HSM noted. No guarding or rebound. No masses appreciated.  Rectal:  Deferred  Msk:  Symmetrical without gross deformities. Normal posture. Extremities:  Without edema. Neurologic:  Alert and  oriented x4;  grossly normal neurologically. Skin:  Intact without significant lesions or rashes. Psych:  Alert and cooperative. Normal mood and affect.   Assessment   TYRESSE MAKOWSKI is a delightful, very kind 62 y.o. male presenting today at the request of Ankabrandt, Irving Burton, [place] to establish GI care. Past medical history pertinent for AAA, renal stones, HTN, significant white coat syndrome with HTN.   He is reporting chronic constipation and occasional bright red blood per rectum. No prior colonoscopy.  Suspect hemorrhoids as culprit for benign anorectal source but needs initial colonoscopy.   For constipation, will add Benefiber and continue with Miralax as needed.      PLAN   Proceed with colonoscopy by Dr. Levon Hedger in near future: the risks, benefits, and alternatives have been discussed with the patient in detail. The patient states understanding and desires to proceed.   Benefiber daily, Miralax prn  Return thereafter for likely banding   Alan Mink, PhD, [facility]

## 2023-10-30 ENCOUNTER — Ambulatory Visit: Payer: Commercial Managed Care - HMO | Admitting: Urology

## 2023-10-30 ENCOUNTER — Telehealth: Payer: Self-pay | Admitting: *Deleted

## 2023-10-30 ENCOUNTER — Encounter: Payer: Self-pay | Admitting: Urology

## 2023-10-30 DIAGNOSIS — N200 Calculus of kidney: Secondary | ICD-10-CM

## 2023-10-30 LAB — URINALYSIS, ROUTINE W REFLEX MICROSCOPIC
Bilirubin, UA: NEGATIVE
Glucose, UA: NEGATIVE
Ketones, UA: NEGATIVE
Leukocytes,UA: NEGATIVE
Nitrite, UA: NEGATIVE
Protein,UA: NEGATIVE
RBC, UA: NEGATIVE
Specific Gravity, UA: 1.025 (ref 1.005–1.030)
Urobilinogen, Ur: 0.2 mg/dL (ref 0.2–1.0)
pH, UA: 6 (ref 5.0–7.5)

## 2023-10-30 MED ORDER — PEG 3350-KCL-NA BICARB-NACL 420 G PO SOLR: 4000.0000 mL | Freq: Once | ORAL | 0 refills | Status: AC

## 2023-10-30 MED ORDER — SODIUM BICARBONATE 650 MG PO TABS: 650.0000 mg | ORAL_TABLET | Freq: Two times a day (BID) | ORAL | 11 refills | Status: DC

## 2023-10-30 MED ORDER — TAMSULOSIN HCL 0.4 MG PO CAPS: 0.4000 mg | ORAL_CAPSULE | Freq: Every day | ORAL | 11 refills | Status: DC

## 2023-10-30 NOTE — Telephone Encounter (Signed)
Spoke with pt. He has been scheduled with Dr. Levon Hedger, [facility] 2 on 1/17 at 11:30am. Aware will send instructions to him. Rx for prep to be sent to [facility] [address]   [place] auth# [id number] exp 12/23/23

## 2023-10-30 NOTE — Patient Instructions (Signed)

## 2023-10-30 NOTE — Progress Notes (Signed)
10/30/2023 11:08 AM   Alan Shepherd 09-Mar-1961 [id number]  Referring provider: No referring provider defined for this encounter.  nephrolithiasis   HPI: [name] is a 62yo here for followup for nephrolithiasis. No stone events since last visit. Renal US from 12/6 shows a 5mm left renal calculus. He is on urocitK daily which is causing constipation. He denies any flank pain. No worsening LUTS. No other complaints today   PMH: Past Medical History:  Diagnosis Date   Aneurysm of ascending aorta (HCC) 12/14/2020   Complication of anesthesia    excessive memory loss with versed   Environmental allergies    Family history of premature coronary artery disease    Hematuria    History of exercise intolerance    11-10-2013   ETT normal    History of kidney stones    Hypertension    primary cardiolgoist-  dr Eden Emms (lov in epic 12-15-2015)---  11-10-2013 ETT normal    Nephrolithiasis    per pt CT at dr Ronne Binning showed bilateral renal stones   Renal calculus, left    White coat syndrome with hypertension     Surgical History: Past Surgical History:  Procedure Laterality Date   CYSTOSCOPY W/ URETERAL STENT PLACEMENT Left 10/04/2017   Procedure: CYSTOSCOPY WITH LEFT  RETROGRADE PYELOGRAM/URETERAL STENT PLACEMENT;  Surgeon: Ihor Gully, MD;  Location: [facility];  Service: Urology;  Laterality: Left;   CYSTOSCOPY WITH RETROGRADE PYELOGRAM, URETEROSCOPY AND STENT PLACEMENT Left 12/09/2017   Procedure: CYSTOSCOPY WITH RETROGRADE PYELOGRAM, URETEROSCOPY AND STENT EXCHANGE, BASKET STONE RETRIVAL;  Surgeon: Malen Gauze, MD;  Location: [facility];  Service: Urology;  Laterality: Left;   CYSTOSCOPY WITH RETROGRADE PYELOGRAM, URETEROSCOPY AND STENT PLACEMENT Left 02/26/2023   Procedure: CYSTOSCOPY WITH RETROGRADE PYELOGRAM, URETEROSCOPY AND STENT PLACEMENT;  Surgeon: Malen Gauze, MD;  Location: [facility];  Service: Urology;  Laterality: Left;    CYSTOSCOPY/RETROGRADE/URETEROSCOPY/STONE EXTRACTION WITH BASKET Bilateral left 04-06-2003;  right 10-08-2007   dr Stark Bray  [facility]   HOLMIUM LASER APPLICATION Left 12/09/2017   Procedure: HOLMIUM LASER APPLICATION;  Surgeon: Malen Gauze, MD;  Location: [facility];  Service: Urology;  Laterality: Left;   HOLMIUM LASER APPLICATION Left 02/26/2023   Procedure: HOLMIUM LASER APPLICATION;  Surgeon: Malen Gauze, MD;  Location: [facility];  Service: Urology;  Laterality: Left;    Home Medications:  Allergies as of 10/30/2023       Reactions   Bee Venom Anaphylaxis   Fire Ant Anaphylaxis   Fire Ant (solenopsis [place]) Anaphylaxis   Gramineae Pollens Anaphylaxis   Aspirin Itching   Bystolic [nebivolol Hcl]    Low HR, forgetful    Onion Itching   Versed [midazolam] Other (See Comments)   "Excessive memory loss for about six months"        Medication List        Accurate as of October 30, 2023 11:08 AM. If you have any questions, ask your nurse or doctor.          amLODipine 5 MG tablet Commonly known as: NORVASC Take 1 tablet (5 mg total) by mouth daily.   ATHLETES FOOT EX Apply 1 spray topically daily as needed (athlete's foot).   EPINEPHrine 0.3 mg/0.3 mL Soaj injection Commonly known as: EPI-PEN Inject 0.3 mg into the muscle as needed for anaphylaxis.   hydrALAZINE 25 MG tablet Commonly known as: APRESOLINE Take 1 tablet (25 mg total) by mouth in the morning and at bedtime.  polyethylene glycol-electrolytes 420 g solution Commonly known as: NuLYTELY Take 4,000 mLs by mouth once for 1 dose. Started by: CMA Mindy E   Potassium Citrate 15 MEQ (1620 MG) Tbcr Commonly known as: Urocit-K 15 Take 1 tablet by mouth 2 (two) times daily.   tamsulosin 0.4 MG Caps capsule Commonly known as: FLOMAX Take 1 capsule (0.4 mg total) by mouth daily.        Allergies:  Allergies  Allergen Reactions   Bee Venom Anaphylaxis   Fire Ant Anaphylaxis    Animator (Solenopsis [place]) Anaphylaxis   Gramineae Pollens Anaphylaxis   Aspirin Itching   Bystolic [Nebivolol Hcl]     Low HR, forgetful    Onion Itching   Versed [Midazolam] Other (See Comments)    "Excessive memory loss for about six months"    Family History: Family History  Problem Relation Age of Onset   Hypertension Father    Hypertension Sister    Hypertension Sister    Colon cancer Neg Hx    Colon polyps Neg Hx     Social History:  reports that he has never smoked. He has never used smokeless tobacco. He reports that he does not drink alcohol and does not use drugs.  ROS: All other review of systems were reviewed and are negative except what is noted above in HPI  Physical Exam: There were no vitals taken for this visit.  Constitutional:  Alert and oriented, No acute distress. HEENT: [place] AT, moist mucus membranes.  Trachea midline, no masses. Cardiovascular: No clubbing, cyanosis, or edema. Respiratory: Normal respiratory effort, no increased work of breathing. GI: Abdomen is soft, nontender, nondistended, no abdominal masses GU: No CVA tenderness.  Lymph: No cervical or inguinal lymphadenopathy. Skin: No rashes, bruises or suspicious lesions. Neurologic: Grossly intact, no focal deficits, moving all 4 extremities. Psychiatric: Normal mood and affect.  Laboratory Data: Lab Results  Component Value Date   WBC 6.3 02/20/2023   HGB 11.8 (L) 02/20/2023   HCT 37.9 (L) 02/20/2023   MCV 66.8 (L) 02/20/2023   PLT 206 02/20/2023    Lab Results  Component Value Date   CREATININE 1.59 (H) 09/19/2023    Lab Results  Component Value Date   PSA 0.8 04/04/2017    No results found for: "TESTOSTERONE"  No results found for: "HGBA1C"  Urinalysis    Component Value Date/Time   COLORURINE COLORLESS (A) 05/03/2022 1624   APPEARANCEUR Clear 09/19/2023 1356   LABSPEC <1.005 (L) 05/03/2022 1624   PHURINE 5.5 05/03/2022 1624   GLUCOSEU Negative 09/19/2023  1356   GLUCOSEU NEGATIVE 11/06/2017 1151   HGBUR NEGATIVE 05/03/2022 1624   BILIRUBINUR Negative 09/19/2023 1356   KETONESUR NEGATIVE 05/03/2022 1624   PROTEINUR Negative 09/19/2023 1356   PROTEINUR NEGATIVE 05/03/2022 1624   UROBILINOGEN 0.2 11/06/2017 1151   NITRITE Negative 09/19/2023 1356   NITRITE NEGATIVE 05/03/2022 1624   LEUKOCYTESUR Negative 09/19/2023 1356   LEUKOCYTESUR NEGATIVE 05/03/2022 1624    Lab Results  Component Value Date   LABMICR See below: 09/19/2023   WBCUA None seen 09/19/2023   LABEPIT 0-10 09/19/2023   MUCUS Present 05/14/2022   BACTERIA None seen 09/19/2023    Pertinent Imaging: Renal US 10/25/2023: Images reviewed and discussed with the patient  Results for orders placed in visit on 05/14/23  DG Abd 1 View  Narrative CLINICAL DATA:  kidney stone  EXAM: ABDOMEN - 1 VIEW  COMPARISON:  Radiograph from 03/13/2023.  FINDINGS: No abnormal calcification noted overlying bilateral  kidneys, ureters and urinary bladder region.  The bowel gas pattern is non-obstructive. There is moderate stool burden, including the ascending colon, compatible with colonic hypomotility.  No evidence of pneumoperitoneum, within the limitations of a supine film.  No acute osseous abnormalities.  The soft tissues are within normal limits.  Surgical changes, devices, tubes and lines: None.  IMPRESSION: 1. No radiographic evidence of urolithiasis. 2. Nonobstructive bowel gas pattern.   Electronically Signed By: Jules Schick M.D. On: 05/20/2023 08:35  No results found for this or any previous visit.  No results found for this or any previous visit.  No results found for this or any previous visit.  Results for orders placed during the hospital encounter of 10/24/23  Ultrasound renal complete  Narrative CLINICAL DATA:  Nephrolithiasis  EXAM: RENAL / URINARY TRACT ULTRASOUND COMPLETE  COMPARISON:  Renal ultrasound 03/27/2023  FINDINGS: Right  Kidney:  Renal measurements: 11.4 x 6.0 x 6.0 cm = volume: 213 mL. Normal renal cortical thickness. Increased cortical echogenicity. Multiple cysts, largest measures 3.3 cm. No imaging follow-up needed.  Left Kidney:  Renal measurements: 11.0 x 6.0 x 4.8 cm = volume: 166 mL. Normal renal cortical thickness. Increased cortical echogenicity. There is a 4.3 cm cyst. No imaging follow-up needed. 5 mm stone.  Bladder:  Appears normal for degree of bladder distention.  Other:  None.  IMPRESSION: 1. No hydronephrosis. 2. Increased cortical echogenicity as can be seen with chronic medical renal disease. 3. Left nephrolithiasis.   Electronically Signed By: Annia Belt M.D. On: 10/24/2023 21:34  No valid procedures specified. No results found for this or any previous visit.  Results for orders placed in visit on 02/01/21  CT RENAL STONE STUDY  Narrative CLINICAL DATA:  Right flank pain since 01/25/2021 with chills. History of renal calculi.  EXAM: CT ABDOMEN AND PELVIS WITHOUT CONTRAST  TECHNIQUE: Multidetector CT imaging of the abdomen and pelvis was performed following the standard protocol without IV contrast.  COMPARISON:  Multiple exams, including renal ultrasound from 01/31/2021  FINDINGS: Lower chest: Mild descending thoracic aortic atherosclerotic calcification.  Hepatobiliary: Unremarkable  Pancreas: Unremarkable  Spleen: Unremarkable  Adrenals/Urinary Tract: Both adrenal glands appear normal. Fluid density 3.4 by 3.2 cm right mid kidney cyst, image 27 series 2. Fluid density 3.3 by 3.5 cm left mid kidney cyst anteriorly on image 24 series 2. Hypodense 1.5 by 1.6 cm lesion in the right kidney lower pole anteriorly has fluid density favoring a cyst, although faintly indistinct margins suggesting some minimal complexity which was also suggested by the sonographic appearance on yesterday's ultrasound. Strictly speaking a Bosniak classification cannot  be assigned due to the lack of IV contrast, but this is most likely to be a complex cyst.  2 mm right kidney lower pole nonobstructive renal calculus.  There are bed 8 nonobstructive left renal calculi, the larger calculi include a 5 mm lower pole calculus on image 58 series 5 and a 5 mm upper pole calculus on image 63 series 5.  No hydronephrosis or hydroureter. No ureteral or bladder calculi noted.  Stomach/Bowel: Unremarkable  Vascular/Lymphatic: Aortoiliac atherosclerotic vascular disease.  Reproductive: Unremarkable  Other: No supplemental non-categorized findings.  Musculoskeletal: Chronic sclerosis in the left proximal femoral metaphysis on image 84 of series 2 is unchanged from 2016 and considered benign.  IMPRESSION: 1. Bilateral nonobstructive nephrolithiasis. 2. Bilateral renal cysts. Probable mild complexity of the right kidney lower pole hypodense cystic lesion measuring 1.6 cm in long axis. 3. Aortic atherosclerosis.  Aortic  Atherosclerosis (ICD10-I70.0).   Electronically Signed By: Gaylyn Rong M.D. On: 02/01/2021 11:39   Assessment & Plan:    1. Nephrolithiasis Switch to sodium bicarb 650mg  BID Followup 6 months with renal US   No follow-ups on file.  Wilkie Aye, MD  [facility] [place]

## 2023-10-31 LAB — BASIC METABOLIC PANEL
BUN/Creatinine Ratio: 15 (ref 10–24)
BUN: 22 mg/dL (ref 8–27)
CO2: 21 mmol/L (ref 20–29)
Calcium: 9.5 mg/dL (ref 8.6–10.2)
Chloride: 105 mmol/L (ref 96–106)
Creatinine, Ser: 1.46 mg/dL — ABNORMAL HIGH (ref 0.76–1.27)
Glucose: 98 mg/dL (ref 70–99)
Potassium: 4.9 mmol/L (ref 3.5–5.2)
Sodium: 145 mmol/L — ABNORMAL HIGH (ref 134–144)
eGFR: 54 mL/min/{1.73_m2} — ABNORMAL LOW (ref >59)

## 2023-10-31 LAB — VITAMIN B12: Vitamin B-12: 401 pg/mL (ref 232–1245)

## 2023-12-06 ENCOUNTER — Encounter (HOSPITAL_COMMUNITY): Payer: Self-pay | Admitting: Gastroenterology

## 2023-12-06 ENCOUNTER — Other Ambulatory Visit: Payer: Self-pay

## 2023-12-06 ENCOUNTER — Ambulatory Visit (HOSPITAL_COMMUNITY): Payer: No Typology Code available for payment source | Admitting: Anesthesiology

## 2023-12-06 ENCOUNTER — Encounter (HOSPITAL_COMMUNITY)
Admission: RE | Disposition: A | Discharge status: Home / Self Care | Payer: Self-pay | Source: Home / Self Care | Attending: Gastroenterology

## 2023-12-06 ENCOUNTER — Ambulatory Visit (HOSPITAL_COMMUNITY)
Admission: RE | Admit: 2023-12-06 | Discharge: 2023-12-06 | Disposition: A | Discharge status: Home / Self Care | Payer: No Typology Code available for payment source | Attending: Gastroenterology | Admitting: Gastroenterology

## 2023-12-06 DIAGNOSIS — D123 Benign neoplasm of transverse colon: Secondary | ICD-10-CM | POA: Diagnosis not present

## 2023-12-06 DIAGNOSIS — K625 Hemorrhage of anus and rectum: Secondary | ICD-10-CM

## 2023-12-06 DIAGNOSIS — K648 Other hemorrhoids: Secondary | ICD-10-CM

## 2023-12-06 DIAGNOSIS — D125 Benign neoplasm of sigmoid colon: Secondary | ICD-10-CM

## 2023-12-06 DIAGNOSIS — I7121 Aneurysm of the ascending aorta, without rupture: Secondary | ICD-10-CM | POA: Insufficient documentation

## 2023-12-06 DIAGNOSIS — I10 Essential (primary) hypertension: Secondary | ICD-10-CM | POA: Insufficient documentation

## 2023-12-06 DIAGNOSIS — K644 Residual hemorrhoidal skin tags: Secondary | ICD-10-CM

## 2023-12-06 HISTORY — PX: HEMOSTASIS CLIP PLACEMENT: SHX6857

## 2023-12-06 HISTORY — PX: COLONOSCOPY WITH PROPOFOL: SHX5780

## 2023-12-06 HISTORY — PX: POLYPECTOMY: SHX149

## 2023-12-06 HISTORY — PX: SCLEROTHERAPY: SHX6841

## 2023-12-06 LAB — HM COLONOSCOPY

## 2023-12-06 SURGERY — COLONOSCOPY WITH PROPOFOL
Anesthesia: General

## 2023-12-06 MED ORDER — SODIUM CHLORIDE 0.9% FLUSH: 3.0000 mL | INTRAVENOUS | Status: DC | PRN

## 2023-12-06 MED ORDER — SODIUM CHLORIDE (PF) 0.9 % IJ SOLN
PREFILLED_SYRINGE | INTRAMUSCULAR | Status: DC | PRN
  Administered 2023-12-06: 1.5 mL

## 2023-12-06 MED ORDER — PROPOFOL 500 MG/50ML IV EMUL
INTRAVENOUS | Status: DC | PRN
  Administered 2023-12-06: 250 ug/kg/min via INTRAVENOUS

## 2023-12-06 MED ORDER — LACTATED RINGERS IV SOLN: INTRAVENOUS | Status: DC | PRN

## 2023-12-06 MED ORDER — SODIUM CHLORIDE 0.9% FLUSH: 3.0000 mL | Freq: Two times a day (BID) | INTRAVENOUS | Status: DC

## 2023-12-06 MED ORDER — PROPOFOL 10 MG/ML IV BOLUS
INTRAVENOUS | Status: DC | PRN
  Administered 2023-12-06 (×2): 100 mg via INTRAVENOUS

## 2023-12-06 NOTE — Anesthesia Postprocedure Evaluation (Signed)
Anesthesia Post Note  Patient: Alan Shepherd  Procedure(s) Performed: COLONOSCOPY WITH PROPOFOL POLYPECTOMY INTESTINAL SCLEROTHERAPY HEMOSTASIS CLIP PLACEMENT  Patient location during evaluation: PACU Anesthesia Type: General Level of consciousness: awake and alert Pain management: pain level controlled Vital Signs Assessment: post-procedure vital signs reviewed and stable Respiratory status: spontaneous breathing, nonlabored ventilation, respiratory function stable and patient connected to nasal cannula oxygen Cardiovascular status: blood pressure returned to baseline and stable Postop Assessment: no apparent nausea or vomiting Anesthetic complications: no   There were no known notable events for this encounter.   Last Vitals:  Vitals:   12/06/23 1116 12/06/23 1120  BP: 99/67 114/81  Pulse: 88 94  Resp: 16   Temp: (!) 36.3 C   SpO2: 93% 95%    Last Pain:  Vitals:   12/06/23 1120  TempSrc:   PainSc: 0-No pain                 Edison Wollschlager L Atticus Wedin

## 2023-12-06 NOTE — Anesthesia Preprocedure Evaluation (Addendum)
Anesthesia Evaluation  Patient identified by MRN, date of birth, ID band Patient awake    Reviewed: Allergy & Precautions, H&P , NPO status , Patient's Chart, lab work & pertinent test results, reviewed documented beta blocker date and time   History of Anesthesia Complications (+) history of anesthetic complications  Airway Mallampati: II  TM Distance: >3 FB Neck ROM: full    Dental no notable dental hx. (+) Dental Advisory Given, Teeth Intact   Pulmonary neg pulmonary ROS   Pulmonary exam normal breath sounds clear to auscultation       Cardiovascular Exercise Tolerance: Good hypertension, Normal cardiovascular exam Rhythm:regular Rate:Normal  Aneurysm of ascending aorta. History exercise intolerance   Neuro/Psych   Anxiety     negative neurological ROS  negative psych ROS   GI/Hepatic negative GI ROS, Neg liver ROS,,,  Endo/Other  negative endocrine ROS    Renal/GU negative Renal ROS  negative genitourinary   Musculoskeletal   Abdominal   Peds  Hematology negative hematology ROS (+) Blood dyscrasia, anemia   Anesthesia Other Findings   Reproductive/Obstetrics negative OB ROS                             Anesthesia Physical Anesthesia Plan  ASA: 3  Anesthesia Plan: General and General LMA   Post-op Pain Management: Minimal or no pain anticipated   Induction: Intravenous  PONV Risk Score and Plan: Propofol infusion  Airway Management Planned: Nasal Cannula and Natural Airway  Additional Equipment: None  Intra-op Plan:   Post-operative Plan:   Informed Consent: I have reviewed the patients History and Physical, chart, labs and discussed the procedure including the risks, benefits and alternatives for the proposed anesthesia with the patient or authorized representative who has indicated his/her understanding and acceptance.     Dental Advisory Given  Plan Discussed with:  CRNA  Anesthesia Plan Comments: (Avoid Versed)       Anesthesia Quick Evaluation

## 2023-12-06 NOTE — H&P (Signed)
Alan Shepherd is an 63 y.o. male.   Chief Complaint: rectal bleeding. HPI: 63 year old male with past medical history AAA, renal stones, HTN, significant white coat syndrome with HTN, who comes to the hospital for evaluation of rectal bleeding.  Patient has presented intermittent bleeding when wiping and hard stools. The patient denies having any nausea, vomiting, fever, chills,  melena, hematemesis, abdominal distention, abdominal pain, diarrhea, jaundice, pruritus or weight loss.   Past Medical History:  Diagnosis Date   Aneurysm of ascending aorta (HCC) 12/14/2020   Complication of anesthesia    excessive memory loss with versed   Environmental allergies    Family history of premature coronary artery disease    Hematuria    History of exercise intolerance    11-10-2013   ETT normal    History of kidney stones    Hypertension    primary cardiolgoist-  dr Eden Emms (lov in epic 12-15-2015)---  11-10-2013 ETT normal    Nephrolithiasis    per pt CT at dr Ronne Binning showed bilateral renal stones   Renal calculus, left    White coat syndrome with hypertension     Past Surgical History:  Procedure Laterality Date   CYSTOSCOPY W/ URETERAL STENT PLACEMENT Left 10/04/2017   Procedure: CYSTOSCOPY WITH LEFT  RETROGRADE PYELOGRAM/URETERAL STENT PLACEMENT;  Surgeon: Ihor Gully, MD;  Location: [facility];  Service: Urology;  Laterality: Left;   CYSTOSCOPY WITH RETROGRADE PYELOGRAM, URETEROSCOPY AND STENT PLACEMENT Left 12/09/2017   Procedure: CYSTOSCOPY WITH RETROGRADE PYELOGRAM, URETEROSCOPY AND STENT EXCHANGE, BASKET STONE RETRIVAL;  Surgeon: Malen Gauze, MD;  Location: [facility];  Service: Urology;  Laterality: Left;   CYSTOSCOPY WITH RETROGRADE PYELOGRAM, URETEROSCOPY AND STENT PLACEMENT Left 02/26/2023   Procedure: CYSTOSCOPY WITH RETROGRADE PYELOGRAM, URETEROSCOPY AND STENT PLACEMENT;  Surgeon: Malen Gauze, MD;  Location: [facility];  Service: Urology;  Laterality:  Left;   CYSTOSCOPY/RETROGRADE/URETEROSCOPY/STONE EXTRACTION WITH BASKET Bilateral left 04-06-2003;  right 10-08-2007   dr Stark Bray  [facility]   HOLMIUM LASER APPLICATION Left 12/09/2017   Procedure: HOLMIUM LASER APPLICATION;  Surgeon: Malen Gauze, MD;  Location: [facility];  Service: Urology;  Laterality: Left;   HOLMIUM LASER APPLICATION Left 02/26/2023   Procedure: HOLMIUM LASER APPLICATION;  Surgeon: Malen Gauze, MD;  Location: [facility];  Service: Urology;  Laterality: Left;    Family History  Problem Relation Age of Onset   Hypertension Father    Hypertension Sister    Hypertension Sister    Colon cancer Neg Hx    Colon polyps Neg Hx    Social History:  reports that he has never smoked. He has never used smokeless tobacco. He reports that he does not drink alcohol and does not use drugs.  Allergies:  Allergies  Allergen Reactions   Bee Venom Anaphylaxis   Fire Ant Anaphylaxis   Animator (Solenopsis [place]) Anaphylaxis   Gramineae Pollens Anaphylaxis   Aspirin Itching   Bystolic [Nebivolol Hcl]     Low HR, forgetful    Onion Itching   Versed [Midazolam] Other (See Comments)    "Excessive memory loss for about six months"    Medications Prior to Admission  Medication Sig Dispense Refill   amLODipine (NORVASC) 5 MG tablet Take 1 tablet (5 mg total) by mouth daily. (Patient not taking: Reported on 10/24/2023) 30 tablet 0   EPINEPHrine 0.3 mg/0.3 mL IJ SOAJ injection Inject 0.3 mg into the muscle as needed for anaphylaxis.     hydrALAZINE (  APRESOLINE) 25 MG tablet Take 1 tablet (25 mg total) by mouth in the morning and at bedtime. (Patient not taking: Reported on 02/20/2023) 60 tablet 5   Miconazole Nitrate (ATHLETES FOOT EX) Apply 1 spray topically daily as needed (athlete's foot).     Potassium Citrate (UROCIT-K 15) 15 MEQ (1620 MG) TBCR Take 1 tablet by mouth 2 (two) times daily. 60 tablet 11   sodium bicarbonate 650 MG tablet Take 1 tablet (650 mg  total) by mouth 2 (two) times daily. 60 tablet 11   tamsulosin (FLOMAX) 0.4 MG CAPS capsule Take 1 capsule (0.4 mg total) by mouth daily. 30 capsule 11    No results found for this or any previous visit (from the past 48 hours). No results found.  Review of Systems  Gastrointestinal:  Positive for blood in stool.  All other systems reviewed and are negative.   Blood pressure (!) 234/111, pulse 98, temperature 98.3 F (36.8 C), temperature source Oral, resp. rate 14, height 5\' 11"  (1.803 m), weight 91.6 kg, SpO2 99%. Physical Exam  GENERAL: The patient is AO x3, in no acute distress. HEENT: Head is normocephalic and atraumatic. EOMI are intact. Mouth is well hydrated and without lesions. NECK: Supple. No masses LUNGS: Clear to auscultation. No presence of rhonchi/wheezing/rales. Adequate chest expansion HEART: RRR, normal s1 and s2. ABDOMEN: Soft, nontender, no guarding, no peritoneal signs, and nondistended. BS +. No masses. EXTREMITIES: Without any cyanosis, clubbing, rash, lesions or edema. NEUROLOGIC: AOx3, no focal motor deficit. SKIN: no jaundice, no rashes  Assessment/Plan 63 year old male with past medical history AAA, renal stones, HTN, significant white coat syndrome with HTN, who comes to the hospital for evaluation of rectal bleeding.  Will proceed with colonoscopy.  Dolores Frame, MD 12/06/2023, 10:28 AM

## 2023-12-06 NOTE — Op Note (Signed)
[facility] Patient Name: Alan Shepherd Procedure Date: 12/06/2023 10:25 AM MRN: [id number] Date of Birth: 06-23-1961 Attending MD: Katrinka Blazing , , [id number] CSN: [id number] Age: 63 Admit Type: Outpatient Procedure:                Colonoscopy Indications:              Rectal bleeding Providers:                Katrinka Blazing, Crystal Page, Pandora Leiter,                            Technician Referring MD:             Katrinka Blazing Medicines:                Monitored Anesthesia Care Complications:            No immediate complications. Estimated Blood Loss:     Estimated blood loss: none. Procedure:                Pre-Anesthesia Assessment:                           - Prior to the procedure, a History and Physical                            was performed, and patient medications, allergies                            and sensitivities were reviewed. The patient's                            tolerance of previous anesthesia was reviewed.                           - The risks and benefits of the procedure and the                            sedation options and risks were discussed with the                            patient. All questions were answered and informed                            consent was obtained.                           - ASA Grade Assessment: II - A patient with mild                            systemic disease.                           After obtaining informed consent, the colonoscope                            was passed under direct vision. Throughout the  procedure, the patient's blood pressure, pulse, and                            oxygen saturations were monitored continuously. The                            [id number]-HQ190L ([id number]) scope was introduced through                            the anus and advanced to the the cecum, identified                            by appendiceal orifice and ileocecal valve. The                             colonoscopy was performed without difficulty. The                            patient tolerated the procedure well. The quality                            of the bowel preparation was excellent. Scope In: 10:45:15 AM Scope Out: 11:11:11 AM Scope Withdrawal Time: 0 hours 18 minutes 33 seconds  Total Procedure Duration: 0 hours 25 minutes 56 seconds  Findings:      Skin tags were found on perianal exam.      A 4 mm polyp was found in the transverse colon. The polyp was sessile.       The polyp was removed with a cold snare. Resection and retrieval were       complete.      A 14 mm polyp was found in the sigmoid colon. The polyp was       multi-lobulated and pedunculated, surface had attached clot and was       eroded. Base of the polyp (not the stalk) was successfully injected with       1 mL of a 0.1 mg/mL solution of epinephrine for a lift polypectomy.       Imaging was performed using white light and narrow band imaging to       visualize the mucosa and demarcate the polyp site after injection for       EMR purposes. The polyp was removed with a hot snare. Resection and       retrieval were complete. To prevent bleeding after the polypectomy, one       hemostatic clip was successfully placed (MR safe). Clip manufacturer:       [employer]. There was no bleeding at the end of the procedure.      Non-bleeding internal hemorrhoids were found during retroflexion. The       hemorrhoids were small. Impression:               - Perianal skin tags found on perianal exam.                           - One 4 mm polyp in the transverse colon, removed  with a cold snare. Resected and retrieved.                           - One 14 mm polyp in the sigmoid colon, removed                            with a hot snare. Resected and retrieved via EMR.                            Clip manufacturer: [employer]. Clip (MR                            safe) was  placed.                           - Non-bleeding internal hemorrhoids. Moderate Sedation:      Per Anesthesia Care Recommendation:           - Discharge patient to home (ambulatory).                           - Resume previous diet.                           - Await pathology results.                           - Repeat colonoscopy for surveillance based on                            pathology results.                           - Start taking one capful of Miralax every day,                            uptitrate up to 3 capfuls per day as needed to keep                            stools soft. Procedure Code(s):        --- Professional ---                           [phone], Colonoscopy, flexible; with removal of                            tumor(s), polyp(s), or other lesion(s) by snare                            technique                           45381, Colonoscopy, flexible; with directed                            submucosal injection(s), any substance Diagnosis Code(s):        ---  Professional ---                           D12.3, Benign neoplasm of transverse colon (hepatic                            flexure or splenic flexure)                           D12.5, Benign neoplasm of sigmoid colon                           K64.4, Residual hemorrhoidal skin tags                           K64.8, Other hemorrhoids                           K62.5, Hemorrhage of anus and rectum CPT copyright 2022 American [employer] Association. All rights reserved. The codes documented in this report are preliminary and upon coder review may  be revised to meet current compliance requirements. Katrinka Blazing, MD Katrinka Blazing,  12/06/2023 11:20:17 AM This report has been signed electronically. Number of Addenda: 0

## 2023-12-06 NOTE — Transfer of Care (Signed)
Immediate Anesthesia Transfer of Care Note  Patient: Alan Shepherd  Procedure(s) Performed: COLONOSCOPY WITH PROPOFOL POLYPECTOMY INTESTINAL SCLEROTHERAPY HEMOSTASIS CLIP PLACEMENT  Patient Location: Short Stay  Anesthesia Type:General and Epidural  Level of Consciousness: awake, alert , oriented, and patient cooperative  Airway & Oxygen Therapy: Patient Spontanous Breathing  Post-op Assessment: Report given to RN, Post -op Vital signs reviewed and stable, and Patient moving all extremities X 4  Post vital signs: Reviewed and stable  Last Vitals:  Vitals Value Taken Time  BP 114/81 12/06/23 1120  Temp 36.3 C 12/06/23 1116  Pulse 94 12/06/23 1120  Resp 16 12/06/23 1116  SpO2 95 % 12/06/23 1120    Last Pain:  Vitals:   12/06/23 1120  TempSrc:   PainSc: 0-No pain      Patients Stated Pain Goal: 6 (12/06/23 1003)  Complications: No notable events documented.

## 2023-12-06 NOTE — Discharge Instructions (Signed)
You are being discharged to home.  Resume your previous diet.  We are waiting for your pathology results.  Your physician has recommended a repeat colonoscopy for surveillance based on pathology results.  Start taking one capful of Miralax every day, uptitrate up to 3 capfuls per day as needed to keep stools soft.

## 2023-12-09 LAB — SURGICAL PATHOLOGY

## 2023-12-10 ENCOUNTER — Encounter (INDEPENDENT_AMBULATORY_CARE_PROVIDER_SITE_OTHER): Payer: Self-pay | Admitting: *Deleted

## 2023-12-10 ENCOUNTER — Encounter (HOSPITAL_COMMUNITY): Payer: Self-pay | Admitting: Gastroenterology

## 2023-12-12 ENCOUNTER — Encounter (INDEPENDENT_AMBULATORY_CARE_PROVIDER_SITE_OTHER): Payer: Self-pay | Admitting: *Deleted

## 2024-02-11 ENCOUNTER — Telehealth: Payer: Self-pay | Admitting: Gastroenterology

## 2024-02-11 NOTE — Telephone Encounter (Signed)
 Alan Shepherd: can you please put patient in an appt with me that works best for him? It will be for a banding but can be any spot. Thanks!

## 2024-02-12 NOTE — Telephone Encounter (Signed)
 Thanks! [facility] about 3 weeks ago. Can we check on that?

## 2024-03-24 ENCOUNTER — Encounter: Payer: Self-pay | Admitting: Gastroenterology

## 2024-03-24 ENCOUNTER — Ambulatory Visit (INDEPENDENT_AMBULATORY_CARE_PROVIDER_SITE_OTHER): Admitting: Gastroenterology

## 2024-03-24 VITALS — Temp 97.5°F | Ht 70.0 in | Wt 208.9 lb

## 2024-03-24 DIAGNOSIS — K641 Second degree hemorrhoids: Secondary | ICD-10-CM | POA: Insufficient documentation

## 2024-03-24 NOTE — Progress Notes (Signed)
      [facility] BANDING PROCEDURE NOTE  Alan Shepherd is a 63 y.o. male presenting today for consideration of hemorrhoid banding. Last colonoscopy Jan 2025: one 4 mm polyp transverse colon, one 14 mm polyp in sigmoid s/p resection and retrieval via EMR and MR compatible clip placed, non-bleeding internal hemorrhoids, tubular adenomas with surveillance in 3 years. Bleeding has resolved but notes pressure, fecal seepage, soiling, sometimes prolapse. No pain. Taking Metamucil occasionally and Miralax has helped daily.    The patient presents with symptomatic grade 2  hemorrhoids, unresponsive to maximal medical therapy, requesting rubber band ligation of his hemorrhoidal disease. All risks, benefits, and alternative forms of therapy were described and informed consent was obtained.  The decision was made to band the left lateral internal hemorrhoid, and the [facility] O'Regan System was used to perform band ligation without complication. Digital anorectal examination was then performed to assure proper positioning of the band, and to adjust the banded tissue as required. The patient was discharged home without pain or other issues. Dietary and behavioral recommendations were given, along with follow-up instructions. The patient will return in several weeks for followup and possible additional banding as required.We will likely band both remaining columns at next visit.   No complications were encountered and the patient tolerated the procedure well.   Delman Ferns, PhD, ANP-BC [facility]

## 2024-03-24 NOTE — Patient Instructions (Signed)
  Please avoid straining.  You should limit your toilet time to 2-3 minutes at the most.   I recommend fiber each morning, and continue the Miralax!  Please call me with any concerns or issues!  I will see you in follow-up for additional banding in several weeks.    I enjoyed seeing you again today! I value our relationship and want to provide genuine, compassionate, and quality care. You may receive a survey regarding your visit with me, and I welcome your feedback! Thanks so much for taking the time to complete this. I look forward to seeing you again.      Delman Ferns, PhD, ANP-BC [facility]

## 2024-04-08 ENCOUNTER — Ambulatory Visit (INDEPENDENT_AMBULATORY_CARE_PROVIDER_SITE_OTHER): Admitting: Gastroenterology

## 2024-04-08 ENCOUNTER — Telehealth (INDEPENDENT_AMBULATORY_CARE_PROVIDER_SITE_OTHER): Payer: Self-pay

## 2024-04-08 ENCOUNTER — Encounter: Payer: Self-pay | Admitting: Gastroenterology

## 2024-04-08 VITALS — Ht 70.0 in | Wt 212.0 lb

## 2024-04-08 DIAGNOSIS — K641 Second degree hemorrhoids: Secondary | ICD-10-CM | POA: Diagnosis not present

## 2024-04-08 NOTE — Telephone Encounter (Signed)
 Patient called back today after having two bandings this morning states one of the bands came off as soon as he got home today. He says he had a bm when he got home and he found one of the bands in the toilet along with mucus and blood.( He says he fished it our to the toilet with a Qtip). He wants to know if this will seal itself off or if he needs to do anything extra due to this. [phone]. Please advise.

## 2024-04-08 NOTE — Progress Notes (Signed)
    [facility] BANDING PROCEDURE NOTE  Alan Shepherd is a 63 y.o. male presenting today for consideration of hemorrhoid banding. Last colonoscopy Jan 2025: one 4 mm polyp transverse colon, one 14 mm polyp in sigmoid s/p resection and retrieval via EMR and MR compatible clip placed, non-bleeding internal hemorrhoids, tubular adenomas with surveillance in 3 years. He has had left lateral banding. He has had improvement with bleeding. Still with pressure. BMs better with Miralax.    The patient presents with symptomatic grade 2 hemorrhoids, unresponsive to maximal medical therapy, requesting rubber band ligation of his hemorrhoidal disease. All risks, benefits, and alternative forms of therapy were described and informed consent was obtained.   The decision was made to band the right posterior and the right anterior internal hemorrhoid, and the [facility] O'Regan System was used to perform band ligation without complication. Digital anorectal examination was then performed to assure proper positioning of the band, and to adjust the banded tissue as required. The patient was discharged home without pain or other issues. Dietary and behavioral recommendations were given, along with follow-up instructions. The patient will return in about 4 weeks and can do anoscopy at that time.   No complications were encountered and the patient tolerated the procedure well.   Delman Ferns, PhD, ANP-BC [facility]

## 2024-04-08 NOTE — Telephone Encounter (Signed)
 One of the hemorrhoids was small and I couldn't feel it very well on DRE, so the band likely was barely on that one. We did 2 bandings, so he does have another band still in there that is in excellent position with a good amount of tissue. Sometimes this happens if not enough tissue to capture. He is ok and does not need to do anything else.

## 2024-04-08 NOTE — Patient Instructions (Signed)
  Please avoid straining.  You should limit your toilet time to 2-3 minutes at the most.   I recommend fiber each morning in the beverage of your choice! Continue Miralax as you are doing.  Please call me with any concerns or issues!  I will see you in follow-up in about 4 weeks.  I enjoyed seeing you again today! I value our relationship and want to provide genuine, compassionate, and quality care. You may receive a survey regarding your visit with me, and I welcome your feedback! Thanks so much for taking the time to complete this. I look forward to seeing you again.      Delman Ferns, PhD, ANP-BC [facility]

## 2024-04-08 NOTE — Telephone Encounter (Signed)
 One of the hemorrhoids was small and I couldn't feel it very well on DRE, so the band likely was barely on that one. We did 2 bandings, so he does have another band still in there that is in excellent position with a good amount of tissue. Sometimes this happens if not enough tissue to capture. He is ok and does not need to do anything else.         Patient made aware and states understanding.

## 2024-04-09 ENCOUNTER — Other Ambulatory Visit (HOSPITAL_COMMUNITY): Payer: Self-pay | Admitting: FAMILY PRACTICE

## 2024-04-09 DIAGNOSIS — Z1211 Encounter for screening for malignant neoplasm of colon: Secondary | ICD-10-CM

## 2024-04-24 ENCOUNTER — Ambulatory Visit (HOSPITAL_COMMUNITY)
Admission: RE | Admit: 2024-04-24 | Discharge: 2024-04-24 | Disposition: A | Discharge status: Home / Self Care | Source: Ambulatory Visit | Attending: Urology | Admitting: Urology

## 2024-04-24 DIAGNOSIS — N200 Calculus of kidney: Secondary | ICD-10-CM | POA: Diagnosis present

## 2024-04-29 ENCOUNTER — Ambulatory Visit (HOSPITAL_COMMUNITY): Admission: RE | Admit: 2024-04-29 | Payer: Non-veteran care | Source: Ambulatory Visit

## 2024-05-05 ENCOUNTER — Ambulatory Visit (INDEPENDENT_AMBULATORY_CARE_PROVIDER_SITE_OTHER): Payer: Self-pay

## 2024-05-06 ENCOUNTER — Ambulatory Visit: Admitting: Gastroenterology

## 2024-05-13 ENCOUNTER — Ambulatory Visit: Payer: Non-veteran care | Admitting: Urology

## 2024-05-13 ENCOUNTER — Other Ambulatory Visit: Payer: Self-pay | Admitting: Urology

## 2024-05-13 DIAGNOSIS — N200 Calculus of kidney: Secondary | ICD-10-CM

## 2024-05-13 NOTE — Addendum Note (Signed)
 Addended byBETHA MALACHY SLICE on: 05/13/2024 09:42 AM   Modules accepted: Orders

## 2024-05-14 ENCOUNTER — Other Ambulatory Visit

## 2024-05-14 DIAGNOSIS — E291 Testicular hypofunction: Secondary | ICD-10-CM

## 2024-05-15 LAB — BASIC METABOLIC PANEL WITH GFR
BUN/Creatinine Ratio: 14 (ref 10–24)
BUN: 21 mg/dL (ref 8–27)
CO2: 21 mmol/L (ref 20–29)
Calcium: 9.3 mg/dL (ref 8.6–10.2)
Chloride: 104 mmol/L (ref 96–106)
Creatinine, Ser: 1.48 mg/dL — ABNORMAL HIGH (ref 0.76–1.27)
Glucose: 94 mg/dL (ref 70–99)
Potassium: 4.6 mmol/L (ref 3.5–5.2)
Sodium: 141 mmol/L (ref 134–144)
eGFR: 53 mL/min/{1.73_m2} — ABNORMAL LOW (ref >59)

## 2024-05-15 LAB — VITAMIN B12: Vitamin B-12: 468 pg/mL (ref 232–1245)

## 2024-05-15 LAB — PSA, TOTAL AND FREE
PSA, Free Pct: 63.6 %
PSA, Free: 0.7 ng/mL
Prostate Specific Ag, Serum: 1.1 ng/mL (ref 0.0–4.0)

## 2024-05-19 ENCOUNTER — Ambulatory Visit (INDEPENDENT_AMBULATORY_CARE_PROVIDER_SITE_OTHER): Payer: Self-pay

## 2024-05-19 ENCOUNTER — Encounter (INDEPENDENT_AMBULATORY_CARE_PROVIDER_SITE_OTHER): Payer: Self-pay

## 2024-05-19 ENCOUNTER — Other Ambulatory Visit: Payer: Self-pay

## 2024-05-19 ENCOUNTER — Ambulatory Visit: Payer: Self-pay | Admitting: Urology

## 2024-05-19 VITALS — BP 125/71 | HR 76 | Temp 96.9°F | Ht 72.0 in | Wt 226.8 lb

## 2024-05-19 DIAGNOSIS — Z1211 Encounter for screening for malignant neoplasm of colon: Secondary | ICD-10-CM

## 2024-05-19 DIAGNOSIS — K2270 Barrett's esophagus without dysplasia: Secondary | ICD-10-CM

## 2024-05-19 NOTE — H&P (Signed)
 GENERAL SURGERY, [facility]  [address] [place] [address]       Name: Ricardo Andrade MRN:  [id number]   Date: 05/19/2024 Age: 63 y.o.     Chief complaint:   Chief Complaint              Colonoscopy           History of present illness:Ricardo Andrade is a 63 y.o. male here to get set up for a EGD and colonoscopy.  He has not had an EGD or colonoscopy for 11 years.  His last was at [facility].  He has a history of Barretts esophagus.  He had no polyps or tumors on colonoscopy at that time.  He denies any rectal bleeding, bloody stools, changes in his weight, bowel habits or appetite.  No family history of colon cancer he is aware of  Past Medical History:  Past Medical History:   Diagnosis Date    Allergic rhinitis     GERD (gastroesophageal reflux disease)     Hearing loss     HTN     Hypothyroidism     Type II diabetes mellitus      Past Surgical History:   Procedure Laterality Date    COLONOSCOPY      ESOPHAGOGASTRODUODENOSCOPY      HX OTHER      Scrotal blood vessel removal for rupture    HX TENDON REPAIR Left     Right arm      Family History       Problem Relation Age of Onset Comments    Breast Cancer Sister      Diabetes type II Brother      Diabetes type II Mother      Heart Disease Brother      Hypertension (High Blood Pressure) Father      Hypothyroidism Mother             Social History     Socioeconomic History    Marital status: Married   Tobacco Use    Smoking status: Never    Smokeless tobacco: Former     Types: Snuff     Quit date: 1997   Vaping Use    Vaping status: Never Used   Substance and Sexual Activity    Alcohol use: Yes     Alcohol/week: 30.0 standard drinks of alcohol     Types: 30 Cans of beer per week     Comment: 30 Pack per week    Drug use: Never      Allergies[1]   Current Outpatient Medications   Medication Sig    APPLE CIDER VINEGAR ORAL Take by mouth    benazepril (LOTENSIN) 5 mg Oral Tablet Take 8 Tablets (40 mg total) by mouth Once a  day    cholecalciferol, vitamin D3, 25 mcg (1,000 unit) Oral Tablet Take 1 Tablet (1,000 Units total) by mouth Once a day    cinnamon bark (CINNAMON ORAL) Take by mouth    cyanocobalamin (VITAMIN B 12) 1,000 mcg Oral Tablet Take 2.5 Tablets (2,500 mcg total) by mouth Once a day (Patient not taking: Reported on 05/19/2024)    docosahexaenoic acid/epa (FISH OIL ORAL) Take by mouth    lansoprazole (PREVACID SOLUTAB) 15 mg Oral Tablet,Rapid Dissolve, DR Take 1 Tablet (15 mg total) by mouth Once a day    levothyroxine (SYNTHROID) 25 mcg Oral Tablet Take 1 Tablet (25 mcg total) by mouth Every  morning And 50 mcg on Saturday and Sunday    MetFORMIN (GLUCOPHAGE) 1,000 mg Oral Tablet Take 1 Tablet (1,000 mg total) by mouth Twice daily with food    multivit-min/ferrous fumarate (MULTI VITAMIN ORAL) Take by mouth    triamterene-hydroCHLOROthiazide (MAXZIDE) 75-50 mg Oral Tablet Take 1 Tablet by mouth Once a day     Review of systems:   Ophthalmologic: Blurred vision denies, discharge denies, itching and redness denies, pain denies   ENT:  Decreased hearing denies   Cardiovascular: Chest pain at rest denies, chest pain with exertion denies, orthopnea denies, palpitations denies   Gastrointestinal:  Abdominal pain denies, diarrhea denies, nausea denies  Women only: Breast lump denies, breast pain denies  Musculoskeletal: Painful joints denies, swollen joints denies  Peripheral vascular: Cold extremities denies, pain/cramping in legs after exertion denies  Skin:  Rash denies, skin lesions denies, skin oozing denies  Neurologic: Gait abnormality denies, tingling/numbness denies    Vital signs:BP 125/71   Pulse 76   Temp 36.1 C (96.9 F)   Ht 1.829 m (6')   Wt 103 kg (226 lb 12.8 oz)   BMI 30.76 kg/m        Exam:    General appearance:  Pleasant, in no acute distress, well nourished   Head:  Normocephalic and atraumatic   Eyes: Pupils equal, round, reactive to light and accommodation, sclerae nonicteric   Ears: Normal   Oral  cavity:  Mucosa moist   Neck/thyroid :  Neck supple, full range of motion, thyroid  normal   Lymph nodes:  No lymphadenopathy   Skin: Warm and dry, no suspicious lesions   Heart: Regular rate and rhythm, no murmurs, rubs, or gallops   Lungs:  Clear to auscultation bilaterally   Chest: No deformities or abnormalities   Breasts: Not examined   Abdomen:  Soft, nontender, nondistended, bowel sounds present, normal   Rectal: Not examined   Extremities: No edema   Neurologic:  Alert and oriented, normal exam  Assessment:  Problem List[2]    ICD-10-CM    1. Encounter for screening for malignant neoplasm of colon  Z12.11       2. Barrett's esophagus determined by biopsy  K22.70          He reports his last csp and EGD was 11 years ago.  He denies any current issues.  He reports a history of Barretts esophagus.  He still remains on Prevacid.   Plan:  Plan for upper endoscopy of the esophagus, stomach, and duodenum. This plan will include biopsy .The procedure and risks were explained to the patient, including the risk of bleeding, perforation, organ injury, and cardiopulmonary complications.  The patient expressed understanding of these risks and would like to proceed with the procedure. Plan to send pathology to tissue cypher.   The patient was advised to have a colonoscopy. The patient was explained the risks including bleeding, infection, cardiopulmonary complications, and injury to organs.  The alternatives of barium enema and virtual colonoscopy were explained to the patient.  Also the options of fecal DNA and fecal occult blood testing were explained as alternatives.  The patient expressed understanding of these risks versus benefits and alternatives, and wishes to proceed with colonoscopy.  The patient will undergo a MiraLax bowel prep and clear liquids the day before the procedure. All questions were answered.     I counseled the patient to HOLD all their medications the morning of their procedure.  We discussed code  status today and patient  chooses to be a FULL CODE.     Follow-up:Return for colonoscopy.   Patient was seen independently in clinic. The cosigning physician was available by phone but did not participate in an examination or management of this patient unless otherwise noted.  Arnell CHRISTELLA Corporal, PA-C  I agree with the above documented assessment and plan.   Patients care is collaborated between [employer], and myself.     Carlin Sloop, MD           [1] No Known Allergies  [2] There is no problem list on file for this patient.

## 2024-05-26 ENCOUNTER — Encounter: Payer: Self-pay | Admitting: Gastroenterology

## 2024-05-26 ENCOUNTER — Ambulatory Visit: Admitting: Gastroenterology

## 2024-05-26 VITALS — Ht 70.0 in | Wt 212.3 lb

## 2024-05-26 DIAGNOSIS — K5900 Constipation, unspecified: Secondary | ICD-10-CM | POA: Diagnosis not present

## 2024-05-26 NOTE — Patient Instructions (Signed)
 I recommend taking fiber daily. You can take Miralax as well daily.  If you would like to try Linzess, try it on a day when you don't have to go anywhere. Linzess works best when taken once a day every day, on an empty stomach, at least 30 minutes before your first meal of the day.  When Linzess is taken daily as directed:  *Constipation relief is typically felt in about a week *IBS-C patients may begin to experience relief from belly pain and overall abdominal symptoms (pain, discomfort, and bloating) in about 1 week,   with symptoms typically improving over 12 weeks.  Diarrhea may occur in the first 2 weeks -keep taking it.  The diarrhea should go away and you should start having normal, complete, full bowel movements. It may be helpful to start treatment when you can be near the comfort of your own bathroom, such as a weekend.   We will see you in 3 months!  I enjoyed seeing you again today! I value our relationship and want to provide genuine, compassionate, and quality care. You may receive a survey regarding your visit with me, and I welcome your feedback! Thanks so much for taking the time to complete this. I look forward to seeing you again.      Therisa MICAEL Stager, PhD, ANP-BC [facility]

## 2024-05-26 NOTE — Progress Notes (Signed)
 Gastroenterology Office Note     Primary Care Physician:  Dasie Perkins, [place]  Primary Gastroenterologist: Dr. Eartha    Chief Complaint   Chief Complaint  Patient presents with   Hemorrhoids    Pt arrives for banding but states this is just a check up.      History of Present Illness   Alan Shepherd is a 63 y.o. male presenting today with a history of chronic constipation, tubular adenomas with large polyp removed in 2025, s/p banding of all 3 columns this year, returning in follow-up after last round of banding.   He has done well historically on fiber and  miralax. He has slacked off of this recently, with some sluggish bowels at time and mild abdominal discomfort improved after defecation. Banding has relieved bleeding, pressure, and he no longer is dealing with symptomatic hemorrhoids. He is interested in considering trying samples of Linzess. 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 He does feel he needs to return to fiber and miralax in morning as he has done in the past.     Last colonoscopy Jan 2025: one 4 mm polyp transverse colon, one 14 mm polyp in sigmoid s/p resection and retrieval via EMR and MR compatible clip placed, non-bleeding internal hemorrhoids, tubular adenomas with surveillance in 3 years    Past Medical History:  Diagnosis Date   Aneurysm of ascending aorta (HCC) 12/14/2020   Complication of anesthesia    excessive memory loss with versed    Environmental allergies    Family history of premature coronary artery disease    Hematuria    History of exercise intolerance    11-10-2013   ETT normal    History of kidney stones    Hypertension    primary cardiolgoist-  dr delford (lov in epic 12-15-2015)---  11-10-2013 ETT normal    Nephrolithiasis    per pt CT at dr sherrilee showed bilateral renal stones   Renal calculus, left    White coat  syndrome with hypertension     Past Surgical History:  Procedure Laterality Date   COLONOSCOPY WITH PROPOFOL  N/A 12/06/2023   Procedure: COLONOSCOPY WITH PROPOFOL ;  Surgeon: Eartha Angelia Sieving, MD;  Location: [facility];  Service: Gastroenterology;  Laterality: N/A;  11:30am, asa 2, pt does not want to move up   CYSTOSCOPY W/ URETERAL STENT PLACEMENT Left 10/04/2017   Procedure: CYSTOSCOPY WITH LEFT  RETROGRADE PYELOGRAM/URETERAL STENT PLACEMENT;  Surgeon: Ottelin, Mark, MD;  Location: [facility];  Service: Urology;  Laterality: Left;   CYSTOSCOPY WITH RETROGRADE PYELOGRAM, URETEROSCOPY AND STENT PLACEMENT Left 12/09/2017   Procedure: CYSTOSCOPY WITH RETROGRADE PYELOGRAM, URETEROSCOPY AND STENT EXCHANGE, BASKET STONE RETRIVAL;  Surgeon: sherrilee Belvie CROME, MD;  Location[facility];  Service: Urology;  Laterality: Left;   CYSTOSCOPY WITH RETROGRADE PYELOGRAM, URETEROSCOPY AND STENT PLACEMENT Left 02/26/2023   Procedure: CYSTOSCOPY WITH RETROGRADE PYELOGRAM, URETEROSCOPY AND STENT PLACEMENT;  Surgeon: sherrilee Belvie CROME, MD;  Location: [facility];  Service: Urology;  Laterality: Left;   CYSTOSCOPY/RETROGRADE/URETEROSCOPY/STONE EXTRACTION WITH BASKET Bilateral left 04-06-2003;  right 10-08-2007   dr fredrik endo  [facility]   HEMOSTASIS CLIP PLACEMENT  12/06/2023   Procedure: HEMOSTASIS CLIP PLACEMENT;  Surgeon: Eartha Angelia Sieving, MD;  Location: [facility];  Service: Gastroenterology;;   HOLMIUM LASER APPLICATION Left 12/09/2017   Procedure: HOLMIUM LASER APPLICATION;  Surgeon: sherrilee,  Belvie CROME, MD;  Location: [facility];  Service: Urology;  Laterality: Left;   HOLMIUM LASER APPLICATION Left 02/26/2023   Procedure: HOLMIUM LASER APPLICATION;  Surgeon: Sherrilee Belvie CROME, MD;  Location: [facility];  Service: Urology;  Laterality: Left;   POLYPECTOMY  12/06/2023   Procedure: POLYPECTOMY INTESTINAL;  Surgeon: Eartha Angelia Sieving, MD;  [facility];   Service: Gastroenterology;;   MATIAS  12/06/2023   Procedure: MATIAS;  Surgeon: Eartha Angelia, Sieving, MD;  [facility]: [facility];  Service: Gastroenterology;;    Current Outpatient Medications  Medication Sig Dispense Refill   EPINEPHrine  0.3 mg/0.3 mL IJ SOAJ injection Inject 0.3 mg into the muscle as needed for anaphylaxis. (Patient not taking: Reported on 05/26/2024)     hydrALAZINE  (APRESOLINE ) 25 MG tablet Take 1 tablet (25 mg total) by mouth in the morning and at bedtime. (Patient not taking: Reported on 05/26/2024) 60 tablet 5   polyethylene glycol (MIRALAX / GLYCOLAX) 17 g packet Take 17 g by mouth as needed. (Patient not taking: Reported on 05/26/2024)     psyllium (METAMUCIL) 58.6 % packet Take 1 packet by mouth daily. (Patient not taking: Reported on 05/26/2024)     No current facility-administered medications for this visit.   Facility-Administered Medications Ordered in Other Visits  Medication Dose Route Frequency Provider Last Rate Last Admin   0.9 %  sodium chloride  infusion   Intravenous Continuous [name], Belvie CROME, MD       diazepam  (VALIUM ) tablet 10 mg  10 mg Oral Once Sherrilee Belvie CROME, MD        Allergies as of 05/26/2024 - Review Complete 05/26/2024  Allergen Reaction Noted   Bee venom Anaphylaxis 11/13/2017   Fire ant Anaphylaxis 11/13/2017   Fire ant (solenopsis invicta) Anaphylaxis 11/13/2017   Gramineae pollens Anaphylaxis 11/13/2017   Aspirin Itching 04/14/2012   Bystolic  [nebivolol  hcl]  01/30/2021   Onion Itching 11/13/2017   Versed  [midazolam ] Other (See Comments) 10/04/2017    Family History  Problem Relation Age of Onset   Hypertension Father    Hypertension Sister    Hypertension Sister    Colon cancer Neg Hx    Colon polyps Neg Hx     Social History   Socioeconomic History   Marital status: Significant Other    Spouse name: Not on file   Number of children: Not on file   Years of education: Not on file   Highest  education level: Not on file  Occupational History   Not on file  Tobacco Use   Smoking status: Never   Smokeless tobacco: Never  Vaping Use   Vaping status: Never Used  Substance and Sexual Activity   Alcohol use: No   Drug use: No   Sexual activity: Not on file  Other Topics Concern   Not on file  Social History Narrative   Not on file   Social Drivers of Health   Financial Resource Strain: Not on file  Food Insecurity: Not on file  Transportation Needs: Not on file  Physical Activity: Not on file  Stress: Not on file  Social Connections: Unknown (04/02/2022)   Received from [facility]   Social Network    Social Network: Not on file  Intimate Partner Violence: Unknown (02/22/2022)   Received from [facility]   HITS    Physically Hurt: Not on file    Insult or Talk Down To: Not on file    Threaten Physical Harm: Not on file  Scream or Curse: Not on file     Review of Systems   Gen: Denies any fever, chills, fatigue, weight loss, lack of appetite.  CV: Denies chest pain, heart palpitations, peripheral edema, syncope.  Resp: Denies shortness of breath at rest or with exertion. Denies wheezing or cough.  GI: Denies dysphagia or odynophagia. Denies jaundice, hematemesis, fecal incontinence. GU : Denies urinary burning, urinary frequency, urinary hesitancy MS: Denies joint pain, muscle weakness, cramps, or limitation of movement.  Derm: Denies rash, itching, dry skin Psych: Denies depression, anxiety, memory loss, and confusion Heme: Denies bruising, bleeding, and enlarged lymph nodes.   Physical Exam   Ht 5' 10 (1.778 m)   Wt 212 lb 4.8 oz (96.3 kg)   BMI 30.46 kg/m  General:   Alert and oriented. Pleasant and cooperative. Well-nourished and well-developed.  Head:  Normocephalic and atraumatic. Eyes:  Without icterus Abdomen:  +BS, soft, non-tender and non-distended. No HSM noted. No guarding or rebound. No masses appreciated.  Msk:  Symmetrical without  gross deformities. Normal posture. Extremities:  Without edema. Neurologic:  Alert and  oriented x4;  grossly normal neurologically. Skin:  Intact without significant lesions or rashes. Psych:  Alert and cooperative. Normal mood and affect.   Assessment/Plan:   Alan Shepherd is a 63 y.o. male presenting today with a history of  chronic constipation, tubular adenomas with large polyp removed in 2025, s/p banding of all 3 columns this year, returning in follow-up after last round of banding.   Internal hemorrhoids now resolved s/p banding. We discussed continued avoidance of constipation, limiting toilet time, and avoiding straining.  Constipation not ideally managed currently, as he has slacked off of Metamucil and Miralax each morning. Historically, this has worked well for him. I would like for him to resume taking this. Will also provide samples of Linzess in case he would like to try this, as he may have better efficac.y  Colonoscopy will be due in Jan 2028.   Return in 3 months.      Therisa MICAEL Stager, PhD, ANP-BC [facility] Gastroenterology   I have reviewed the note and agree with the APP's assessment as described in this progress note  Toribio Fortune, MD Gastroenterology and Hepatology [facility] Gastroenterology

## 2024-07-28 ENCOUNTER — Encounter (HOSPITAL_COMMUNITY): Payer: Self-pay | Admitting: Surgery

## 2024-07-30 ENCOUNTER — Telehealth (INDEPENDENT_AMBULATORY_CARE_PROVIDER_SITE_OTHER): Payer: Self-pay | Admitting: Surgery

## 2024-07-30 NOTE — Telephone Encounter (Signed)
 07/30/24 SPOKE WITH Ricardo Andrade ABOUT His 1130 ARRIVAL TIME FOR His PROCEDURE ON 07/31/24

## 2024-07-31 ENCOUNTER — Encounter (HOSPITAL_COMMUNITY): Payer: Self-pay | Admitting: Surgery

## 2024-07-31 ENCOUNTER — Ambulatory Visit (HOSPITAL_COMMUNITY): Admitting: Surgery

## 2024-07-31 ENCOUNTER — Other Ambulatory Visit: Payer: Self-pay

## 2024-07-31 ENCOUNTER — Ambulatory Visit (HOSPITAL_COMMUNITY)

## 2024-07-31 ENCOUNTER — Encounter (HOSPITAL_COMMUNITY): Payer: Self-pay

## 2024-07-31 ENCOUNTER — Ambulatory Visit
Admission: RE | Admit: 2024-07-31 | Discharge: 2024-07-31 | Disposition: A | Discharge status: Home / Self Care | Source: Ambulatory Visit | Attending: Surgery | Admitting: Surgery

## 2024-07-31 DIAGNOSIS — Z79899 Other long term (current) drug therapy: Secondary | ICD-10-CM | POA: Insufficient documentation

## 2024-07-31 DIAGNOSIS — K449 Diaphragmatic hernia without obstruction or gangrene: Secondary | ICD-10-CM

## 2024-07-31 DIAGNOSIS — D123 Benign neoplasm of transverse colon: Secondary | ICD-10-CM | POA: Insufficient documentation

## 2024-07-31 DIAGNOSIS — D122 Benign neoplasm of ascending colon: Secondary | ICD-10-CM

## 2024-07-31 DIAGNOSIS — K2100 Gastro-esophageal reflux disease with esophagitis, without bleeding: Secondary | ICD-10-CM

## 2024-07-31 DIAGNOSIS — Z1211 Encounter for screening for malignant neoplasm of colon: Secondary | ICD-10-CM

## 2024-07-31 DIAGNOSIS — K317 Polyp of stomach and duodenum: Secondary | ICD-10-CM | POA: Insufficient documentation

## 2024-07-31 HISTORY — PX: ESOPHAGOGASTRODUODENOSCOPY: SHX1529

## 2024-07-31 HISTORY — PX: COLONOSCOPY: SHX174

## 2024-07-31 LAB — POC BLOOD GLUCOSE (RESULTS): GLUCOSE, POC: 104 mg/dL (ref 70–105)

## 2024-07-31 SURGERY — COLONOSCOPY
Anesthesia: General | Wound class: Clean Contaminated Wounds-The respiratory, GI, Genital, or urinary

## 2024-07-31 MED ORDER — SODIUM CHLORIDE 0.9 % (FLUSH) INJECTION SYRINGE
10.0000 mL | INJECTION | INTRAMUSCULAR | Status: DC | PRN
Start: 2024-07-31 — End: 2024-07-31

## 2024-07-31 MED ORDER — LIDOCAINE (PF) 20 MG/ML (2 %) INJECTION SOLUTION
Freq: Once | INTRAMUSCULAR | Status: DC | PRN
Start: 2024-07-31 — End: 2024-07-31
  Administered 2024-07-31: 60 mg via INTRAVENOUS

## 2024-07-31 MED ORDER — PROPOFOL 10 MG/ML IV BOLUS
INJECTION | Freq: Once | INTRAVENOUS | Status: DC | PRN
Start: 2024-07-31 — End: 2024-07-31
  Administered 2024-07-31: 20 mg via INTRAVENOUS
  Administered 2024-07-31: 30 mg via INTRAVENOUS
  Administered 2024-07-31: 140 mg via INTRAVENOUS

## 2024-07-31 MED ORDER — ONDANSETRON HCL (PF) 4 MG/2 ML INJECTION SOLUTION
Freq: Once | INTRAMUSCULAR | Status: DC | PRN
Start: 2024-07-31 — End: 2024-07-31
  Administered 2024-07-31: 4 mg via INTRAVENOUS

## 2024-07-31 MED ORDER — GLYCOPYRROLATE 0.2 MG/ML INJECTION SOLUTION
Freq: Once | INTRAMUSCULAR | Status: DC | PRN
Start: 2024-07-31 — End: 2024-07-31
  Administered 2024-07-31: .2 mg via INTRAVENOUS

## 2024-07-31 MED ORDER — PROPOFOL 10 MG/ML INTRAVENOUS EMULSION
INTRAVENOUS | Status: DC | PRN
Start: 2024-07-31 — End: 2024-07-31
  Administered 2024-07-31: 200 ug/kg/min via INTRAVENOUS
  Administered 2024-07-31: 0 ug/kg/min via INTRAVENOUS

## 2024-07-31 MED ORDER — SODIUM CHLORIDE 0.9 % (FLUSH) INJECTION SYRINGE
10.0000 mL | INJECTION | Freq: Three times a day (TID) | INTRAMUSCULAR | Status: DC
Start: 2024-07-31 — End: 2024-07-31

## 2024-07-31 MED ORDER — LACTATED RINGERS INTRAVENOUS SOLUTION
INTRAVENOUS | Status: DC
Start: 2024-07-31 — End: 2024-07-31

## 2024-07-31 SURGICAL SUPPLY — 11 items
BAG PLASTIC 9X6IN 3 WL BIOHAZ RCLS DOC PCH THK2 MIL SPECI TRNSPT (SPECIMEN COLLECTION SUPPLIES) ×1 IMPLANT
BLOCK BITE 60FR STRD STRAP SDPRT DENTAL RETENTION RIM LF  MAXI DISP (ENDOSCOPIC SUPPLIES) ×1 IMPLANT
BRUSH ENDOSCP CLN HEDGEHOG 2 END CHNL VALVE (MED SURG SUPPLIES) ×1 IMPLANT
FORCEPS BIOPSY NEEDLE 160CM 1.8MM RJ 4 DISP YW 2MM WRK CHNL GSPED (MED SURG SUPPLIES) ×1 IMPLANT
KIT SURG CUSTOM NONST DISP LF (CUSTOM TRAYS & PACK) ×1 IMPLANT
LUB INSTR 2OZ BCTRST FLIPTOP CAP SRGLB STRL (MEDICATIONS/SOLUTIONS) ×1 IMPLANT
MANIFLD SUCT NPTN 2 2 STD 4 PORT WASTE MGMT SYS NONST LF  DISP (MED SURG SUPPLIES) ×1 IMPLANT
SENSOR ADULT NLCR LOSAT 18IN C30+ KG OXIMETER SPO2 ADH TEAR RST BANDAGE PVC STRL LF  DISP (MED SURG SUPPLIES) ×1 IMPLANT
SET ADMIN 100IN 15 GTT/ML 16ML IV LS OPTNLK CLV CAIR PRIM CNVRTBL PIERCE PIN MACBR TUBE BKCHK VALVE (IV TUBING & ACCESSORIES) ×1 IMPLANT
UNDERPD INCONT 36X23IN POLYPROP HVY ABS FLF COR LF  DISP WNG + DRSRB + LIGHT BLU (MED SURG SUPPLIES) ×1 IMPLANT
VALVE SUCT ORCAPOD ORCA SEAL AIR WATER SIL FREE ENCLOSE SPRG BIOPSY OLMPS 160/180/190 SER AUX PORT (ENDOSCOPIC SUPPLIES) ×1 IMPLANT

## 2024-07-31 NOTE — Anesthesia Transfer of Care (Signed)
 ANESTHESIA TRANSFER OF CARE   Kiyoshi Schaab is a 63 y.o. ,male, Weight: 97.6 kg (215 lb 2.7 oz)   had Procedure(s):  COLONOSCOPY with cold forcep polypectomy  GASTROSCOPY WITH BIOPSY  performed  07/31/24   Primary Service: Carlin Sloop, MD    Past Medical History:   Diagnosis Date    Allergic rhinitis     GERD (gastroesophageal reflux disease)     Hearing loss     HTN     Hypothyroidism     Type II diabetes mellitus       Allergy History as of 07/31/24        No Known Allergies                  I completed my transfer of care / handoff to the receiving personnel during which we discussed:   Last OR Temp: Temperature: 36.2 C (97.2 F)  ABG:   Airway:* No LDAs found *  Blood pressure 114/74, pulse 78, temperature 36.2 C (97.2 F), resp. rate 16, height 1.829 m (6'), weight 97.6 kg (215 lb 2.7 oz), SpO2 96%.

## 2024-07-31 NOTE — Discharge Instructions (Addendum)
 SURGICAL DISCHARGE INSTRUCTIONS     Dr. Edmund Dunnings, MD  performed your COLONOSCOPY with cold forcep polypectomy, GASTROSCOPY WITH BIOPSY today at the [facility]    [facility]:  Monday through Friday from 8 a.m. - 4 p.m.: [phone]   Between 4 p.m. - 8 a.m., weekends and holidays:  Call ER [phone]    PLEASE SEE WRITTEN HANDOUTS AS DISCUSSED BY YOUR NURSE:  Jerel Barefoot, RN    SIGNS AND SYMPTOMS OF A WOUND / INCISION INFECTION   Be sure to watch for the following:  Increase in redness or red streaks near or around the wound or incision.  Increase in pain that is intense or severe and cannot be relieved by the pain medication that your doctor has given you.  Increase in swelling that cannot be relieved by elevation of a body part, or by applying ice, if permitted.  Increase in drainage, or if yellow / green in color and smells bad. This could be on a dressing or a cast.  Increase in fever for longer than 24 hours, or an increase that is higher than 101 degrees Fahrenheit (normal body temperature is 98 degrees Fahrenheit). The incision may feel warm to the touch.    **CALL YOUR DOCTOR IF ONE OR MORE OF THESE SIGNS / SYMPTOMS SHOULD OCCUR.    ANESTHESIA INFORMATION   ANESTHESIA -- ADULT PATIENTS:  You have received intravenous sedation / general anesthesia, and you may feel drowsy and light-headed for several hours. You may even experience some forgetfulness of the procedure. DO NOT DRIVE A MOTOR VEHICLE or perform any activity requiring complete alertness or coordination until you feel fully awake in about 24-48 hours. Do not drink alcoholic beverages for at least 24 hours. Do not stay alone, you must have a responsible adult available to be with you. You may also experience a dry mouth or nausea for 24 hours. This is a normal side effect and will disappear as the effects of the medication wear off.    REMEMBER   If you experience any  difficulty breathing, chest pain, bleeding that you feel is excessive, persistent nausea or vomiting or for any other concerns:  Call your physician Dr.  Edmund Dunnings, MD  at [phone]. You may also ask to have the general doctor on call paged. They are available to you 24 hours a day.      SPECIAL INSTRUCTIONS / COMMENTS   Follow all of Dr. Vallarie instructions, take medications as prescribed, keep all follow up appointments.    FOLLOW-UP APPOINTMENTS   Please call your surgeon's office at the number listed to schedule a date / time of return for follow-up.

## 2024-07-31 NOTE — OR Nursing (Signed)
 EGD ended at 1320  Colonoscopy started at 1324

## 2024-07-31 NOTE — H&P (Signed)
 [facility]   Admission H&P    Ricardo Andrade, Ricardo Andrade, 63 y.o. male  Date of Admission:  07/31/2024  Date of Birth:  1961-07-22    Information Obtained from: patient and history reviewed via medical record    Chief Complaint:  History of Barrett's esophagus [Z87.19]  Screen for colon cancer [Z12.11]     HPI: Ricardo Andrade is a 63 y.o., White male who presents with a history of no recent screening colonoscopy.  The patient also has a history of Barretts esophagus and chronic gastroesophageal reflux disease symptoms    Past Medical History:   Diagnosis Date    Allergic rhinitis     GERD (gastroesophageal reflux disease)     Hearing loss     HTN     Hypothyroidism     Type II diabetes mellitus          Past Surgical History:   Procedure Laterality Date    COLONOSCOPY      ESOPHAGOGASTRODUODENOSCOPY      HX OTHER      Scrotal blood vessel removal for rupture    HX TENDON REPAIR Left     Right arm         Medications Prior to Admission       Prescriptions    APPLE CIDER VINEGAR ORAL    Take by mouth    Patient not taking:  Reported on 07/28/2024    benazepril (LOTENSIN) 5 mg Oral Tablet    Take 8 Tablets (40 mg total) by mouth Once a day    cholecalciferol, vitamin D3, 25 mcg (1,000 unit) Oral Tablet    Take 1 Tablet (1,000 Units total) by mouth Once a day    Patient not taking:  Reported on 07/28/2024    cinnamon bark (CINNAMON ORAL)    Take by mouth    Patient not taking:  Reported on 07/28/2024    cyanocobalamin (VITAMIN B 12) 1,000 mcg Oral Tablet    Take 2.5 Tablets (2,500 mcg total) by mouth Once a day    Patient not taking:  Reported on 05/19/2024    docosahexaenoic acid/epa (FISH OIL ORAL)    Take by mouth    Patient not taking:  Reported on 07/28/2024    lansoprazole (PREVACID SOLUTAB) 15 mg Oral Tablet,Rapid Dissolve, DR    Take 1 Tablet (15 mg total) by mouth Once a day    levothyroxine (SYNTHROID) 25 mcg Oral Tablet    Take 1 Tablet (25 mcg total) by mouth Every morning And 50 mcg on Saturday  and Sunday    MetFORMIN (GLUCOPHAGE) 1,000 mg Oral Tablet    Take 1 Tablet (1,000 mg total) by mouth Twice daily with food    multivit-min/ferrous fumarate (MULTI VITAMIN ORAL)    Take by mouth    Patient not taking:  Reported on 07/28/2024    triamterene-hydroCHLOROthiazide (MAXZIDE) 75-50 mg Oral Tablet    Take 1 Tablet by mouth Once a day          Allergies[1]  Social History     Socioeconomic History    Marital status: Married   Tobacco Use    Smoking status: Never    Smokeless tobacco: Former     Types: Snuff     Quit date: 1997   Vaping Use    Vaping status: Never Used   Substance and Sexual Activity    Alcohol use: Yes     Alcohol/week: 30.0 standard drinks of alcohol  Types: 30 Cans of beer per week     Comment: 30 Pack per week    Drug use: Never   Other Topics Concern    Ability to Walk 1 Flight of Steps without SOB/CP Yes    Ability to Walk 2 Flight of Steps without SOB/CP Yes    Ability To Do Own ADL's Yes     Family Medical History:       Problem Relation (Age of Onset)    Breast Cancer Sister    Diabetes type II Mother, Brother    Heart Disease Brother    Hypertension (High Blood Pressure) Father    Hypothyroidism Mother             ROS: All pertinent review of systems as address in HPI    Vital Signs:     General appearance:  Pleasant, in no acute distress, well nourished   Head:  Normocephalic and atraumatic   Eyes: Pupils equal, round, reactive to light and accommodation, sclerae nonicteric   Ears: Normal   Oral cavity:  Mucosa moist   Neck/thyroid :  Neck supple, full range of motion, thyroid  normal   Lymph nodes:  No lymphadenopathy   Skin: Warm and dry, no suspicious lesions   Heart: Regular rate and rhythm, no murmurs, rubs, or gallops   Lungs:  Clear to auscultation bilaterally   Chest: No deformities or abnormalities   Breasts: Not examined   Abdomen:  Soft, nontender, nondistended, bowel sounds present, normal   Rectal: Not examined   Extremities: No edema   Neurologic:  Alert and oriented,  normal exam      LABS:  I have reviewed all lab results.  Lab Results Today:  No results found for any visits on 07/31/24 (from the past 24 hours).    Radiology Results: No results found.       There is no immunization history on file for this patient.    DNR Status:  No Order      ASSESSMENT:  Patient has chronic gastroesophageal reflux disease symptoms and Barretts esophagus.  He has not had a recent screening colonoscopy  There are no active hospital problems to display for this patient.    No diagnosis found.        DVT/PE Prophylaxis: Not indicated, low risk for VTE  PLAN:    Plan for upper endoscopy of the esophagus, stomach, and duodenum. This will include plan will include biopsy .The procedure and risks were explained to the patient, including the risk of bleeding, perforation, organ injury, and cardiopulmonary complications.  The patient expressed understanding of these risks and would like to proceed with the procedure.    The patient was advised to have a colonoscopy.  Due to patient specific factors, alternatives are not recommended to colonoscopy.  The patient was explained the risks including bleeding, infection, cardiopulmonary complications, and injury to organs.    The patient expressed understanding of these risks versus benefits, and wishes to proceed with colonoscopy.  The patient will undergo a MiraLax bowel prep and clear liquids the day before the procedure. All questions were answered.      Carlin Sloop, MD,         [1] No Known Allergies

## 2024-07-31 NOTE — Anesthesia Preprocedure Evaluation (Addendum)
 ANESTHESIA PRE-OP EVALUATION  Planned Procedure: COLONOSCOPY  GASTROSCOPY WITH BIOPSY  Review of Systems     anesthesia history negative     patient summary reviewed          Pulmonary   Hx snuff and past history of smoking ,   Cardiovascular    Hypertension and ACE / ARB inhibitor use ,No peripheral edema,  Exercise Tolerance: > or = 4 METS        GI/Hepatic/Renal    GERD and well controlled no liver disease and no renal insufficiency        Endo/Other    hypothyroidism and obesity,   type 2 diabetes/ controlled with oral medications    Neuro/Psych/MS    30 pack or more per week, Substance use, alcohol no seizures, no CVA       Cancer    negative hematology/oncology ROS,                     Physical Assessment      Airway       Mallampati: II    TM distance: 3 FB    Neck ROM: full  Mouth Opening: good.  Facial hair  Beard        Dental       Dentition intact          Comment: Crowns on several molars       Pulmonary    Comment: Hx snuff  Breath sounds clear to auscultation  (-) no rhonchi, no decreased breath sounds, no wheezes, no rales and no stridor     Cardiovascular    Rhythm: regular  Rate: Normal  (-) no friction rub, carotid bruit is not present, no peripheral edema and no murmur     Other findings              Plan  ASA 2     Planned anesthesia type: general     total intravenous anesthesia                    Intravenous induction     Anesthesia issues/risks discussed are: PONV, Cardiac Events/MI, Intraoperative Awareness/ Recall, Stroke, Aspiration, Sore Throat and Dental Injuries.  Anesthetic plan and risks discussed with patient  signed consent obtained          Patient's NPO status is appropriate for Anesthesia.

## 2024-07-31 NOTE — OR Surgeon (Addendum)
 [facility]   Operative Note   PATIENT NAME:  Ricardo Andrade, Ricardo Andrade  MRN:  [id number]  DOB:  [birth year]    Date of Procedure:  07/31/2024  Preoperative Diagnosis: History of Barrett's esophagus [Z87.19]  Screen for colon cancer [Z12.11]   Postoperative Diagnoses: .Barretts esophagus  Hiatal Hernia  Polyps  Procedure Performed:  Upper endoscopy of the esophagus, stomach and duodenum with biopsies of the distal esophagus and stomach                                          Flexible colonoscopy to the cecum and ileocecal valve with polypectomy using standard forceps removal of polyps in the transverse colon and ascending colon (5 mm each)  Surgeon: Dunnings Sloop, MD   Anesthesia: IV General  Estimated Blood Loss: Minimal  Complications: None immediate  HPI & Indications:  Patient is due for screening colonoscopy.  He also has a history of Barretts esophagus.  He was scheduled for upper endoscopy and colonoscopy  Description of Procedure:  Patient was given a standard MiraLax bowel prep.  A time-out was done.  Patient was given IV sedation with propofol .  Gastroscope was used to visualize the esophagus, stomach and duodenum.  The patient had a segment of Barretts esophagus proximally 1.5 cm.  This was biopsied.  The patient had a small hiatal hernia.  The rest of the upper endoscopy was normal.  The upper endoscope was passed to the 3rd portion of the duodenum and then withdrawn.  It was retroflexed in the stomach and withdrawn.  Biopsy was taken of the stomach as well as distal esophagus.  The scope was withdrawn without complication.  After this a rectal exam was performed followed by flexible colonoscopy to the cecum and ileocecal valve.  Despite multiple attempts with the patient in left lateral decubitus, right lateral decubitus and supine positions with counter pressure and various stiffness degrees on the scope, the colonoscope could not be brought all the way into the cecum to visualize the  appendiceal orifice.  A limited view of the cecum was obtained as well as ileocecal valve and ascending colon.  Polyps removed from the ascending colon and transverse colon with standard forceps.  The prep was adequate  Assessment:  The patient had the colonoscope performed to the ileocecal valve.  The patient had a hiatal hernia and Barretts esophagus.  The patient will continue lansoprazole.  The patient will follow up in 10 days to review pathology.  He will likely require follow up upper endoscopy and colonoscopy in about 3 years     Findings:  Upper endoscopy:  Small hiatal hernia  1.5 cm segment of Barretts esophagus  Otherwise normal stomach and duodenum  Findings on colonoscopy:  1. Adequate prep  2. 5 mm polyps of the ascending colon and transverse colon removed with standard forceps  3. Colonoscope was passed to the ileocecal valve and a portion of cecum was seen but not the entire cecum despite multiple techniques to advance the colonoscope   Dunnings Sloop, MD   This note was partially generated using MModal Fluency Direct system, and there may be some incorrect words, spellings, and punctuation that were not noted in checking the note before saving.

## 2024-07-31 NOTE — Anesthesia Postprocedure Evaluation (Signed)
 Anesthesia Post Op Evaluation    Patient: Ricardo Andrade  Procedure(s):  COLONOSCOPY with cold forcep polypectomy  GASTROSCOPY WITH BIOPSY    Last Vitals:Temperature: 36.2 C (97.2 F) (07/31/24 1150)  Heart Rate: 78 (07/31/24 1356)  BP (Non-Invasive): 114/74 (07/31/24 1356)  Respiratory Rate: 16 (07/31/24 1356)  SpO2: 96 % (07/31/24 1356)    There were no known notable events for this encounter.    Patient is sufficiently recovered from the effects of anesthesia to participate in the evaluation and has returned to their pre-procedure level.  Patient location during evaluation: PACU       Patient participation: complete - patient participated  Level of consciousness: awake and alert and responsive to verbal stimuli    Pain management: adequate  Airway patency: patent    Anesthetic complications: no  Cardiovascular status: acceptable  Respiratory status: acceptable  Hydration status: acceptable  Patient post-procedure temperature: Pt Normothermic   PONV Status: Absent

## 2024-07-31 NOTE — OR Nursing (Signed)
 Sterile water used for irrigation

## 2024-08-04 LAB — SURGICAL PATHOLOGY SPECIMEN

## 2024-08-11 ENCOUNTER — Ambulatory Visit (INDEPENDENT_AMBULATORY_CARE_PROVIDER_SITE_OTHER): Payer: Self-pay | Admitting: Surgery

## 2024-08-11 ENCOUNTER — Other Ambulatory Visit: Payer: Self-pay

## 2024-08-11 ENCOUNTER — Encounter (INDEPENDENT_AMBULATORY_CARE_PROVIDER_SITE_OTHER): Payer: Self-pay | Admitting: Surgery

## 2024-08-11 VITALS — BP 120/80 | HR 68 | Temp 96.7°F | Ht 72.0 in | Wt 221.4 lb

## 2024-08-11 DIAGNOSIS — D126 Benign neoplasm of colon, unspecified: Secondary | ICD-10-CM

## 2024-08-11 DIAGNOSIS — K2270 Barrett's esophagus without dysplasia: Secondary | ICD-10-CM

## 2024-08-11 NOTE — Progress Notes (Signed)
 GENERAL SURGERY, [facility]  [address] [place] [address]       Name: Ricardo Andrade MRN:  [id number]   Date: 08/11/2024 Age: 63 y.o.     Chief complaint:  The patient is here for follow up after upper endoscopy and colonoscopy  Chief Complaint              Post-op #1 EGD/Colonoscopy          History of present illness:Ricardo Andrade is a 63 y.o. male who is here for follow up after upper endoscopy and colonoscopy.  He has a history of Barretts esophagus but biopsies were negative for Barretts this time.  The patient also had adenomatous polyps.  He is doing well without specific complaints.  Past Medical History:  Past Medical History:   Diagnosis Date    Allergic rhinitis     GERD (gastroesophageal reflux disease)     Hearing loss     HTN     Hypothyroidism     Type II diabetes mellitus      Past Surgical History:   Procedure Laterality Date    COLONOSCOPY  07/31/2024    ESOPHAGOGASTRODUODENOSCOPY  07/31/2024    HX COLONOSCOPY      HX OTHER      Scrotal blood vessel removal for rupture    HX TENDON REPAIR Left     Right arm    HX UPPER ENDOSCOPY        Family History       Problem Relation Age of Onset Comments    Breast Cancer Sister      Diabetes type II Brother      Diabetes type II Mother      Heart Disease Brother      Hypertension (High Blood Pressure) Father      Hypothyroidism Mother             Social History     Socioeconomic History    Marital status: Married   Tobacco Use    Smoking status: Never    Smokeless tobacco: Former     Types: Snuff     Quit date: 1997   Vaping Use    Vaping status: Never Used   Substance and Sexual Activity    Alcohol use: Yes     Alcohol/week: 30.0 standard drinks of alcohol     Types: 30 Cans of beer per week     Comment: 30 Pack per week    Drug use: Never   Other Topics Concern    Ability to Walk 1 Flight of Steps without SOB/CP Yes    Ability to Walk 2 Flight of Steps without SOB/CP Yes    Ability To Do Own ADL's Yes      Allergies[1]    Current Outpatient Medications   Medication Sig    benazepril (LOTENSIN) 5 mg Oral Tablet Take 8 Tablets (40 mg total) by mouth Daily    lansoprazole (PREVACID SOLUTAB) 15 mg Oral Tablet,Rapid Dissolve, DR Take 1 Tablet (15 mg total) by mouth Once a day    levothyroxine (SYNTHROID) 25 mcg Oral Tablet Take 1 Tablet (25 mcg total) by mouth Every morning And 50 mcg on Saturday and Sunday    MetFORMIN (GLUCOPHAGE) 1,000 mg Oral Tablet Take 1 Tablet (1,000 mg total) by mouth Twice daily with food    triamterene-hydroCHLOROthiazide (MAXZIDE) 75-50 mg Oral Tablet Take 1 Tablet by mouth Daily     Review of systems:  All pertinent review of system as address in the HPI.  Vital signs:BP 120/80   Pulse 68   Temp (!) 35.9 C (96.7 F)   Ht 1.829 m (6')   Wt 100 kg (221 lb 6.4 oz)   BMI 30.03 kg/m        Exam:    General appearance:  Pleasant, in no acute distress, well nourished   Head:  Normocephalic and atraumatic   Eyes: Pupils equal, round, reactive to light and accommodation, sclerae nonicteric   Ears: Normal   Oral cavity:  Mucosa moist   Neck/thyroid :  Neck supple, full range of motion, thyroid  normal   Lymph nodes:  No lymphadenopathy   Skin: Warm and dry, no suspicious lesions   Heart: Regular rate and rhythm, no murmurs, rubs, or gallops   Lungs:  Clear to auscultation bilaterally   Chest: No deformities or abnormalities   Breasts: Not examined   Abdomen:  Soft, nontender, nondistended, bowel sounds present, normal   Rectal: Not examined   Extremities: No edema   Neurologic:  Alert and oriented, normal exam  Assessment:  Problem List[2]     ICD-10-CM    1. Barrett's esophagus determined by biopsy  K22.70       2. Colon adenoma  D12.6          The patient has a history of Barretts esophagus but none was found this time.  The patient also had 2 colon adenomatous removed.  The patient is doing well  Plan:    The patient will follow up with upper endoscopy and colonoscopy in 5 years.  The patient will continue  lansoprazole  Follow-up:No follow-ups on file.   Carlin Sloop, MD         [1] No Known Allergies  [2] There is no problem list on file for this patient.

## 2024-08-24 ENCOUNTER — Encounter (HOSPITAL_COMMUNITY): Payer: Self-pay | Admitting: Family Medicine

## 2024-08-26 ENCOUNTER — Ambulatory Visit: Admitting: Gastroenterology

## 2024-09-02 ENCOUNTER — Encounter (INDEPENDENT_AMBULATORY_CARE_PROVIDER_SITE_OTHER): Payer: Self-pay | Admitting: Gastroenterology

## 2024-09-04 ENCOUNTER — Other Ambulatory Visit (HOSPITAL_COMMUNITY): Payer: Self-pay | Admitting: Physician Assistant

## 2024-09-04 DIAGNOSIS — I7121 Aneurysm of the ascending aorta, without rupture: Secondary | ICD-10-CM

## 2024-10-28 ENCOUNTER — Ambulatory Visit (HOSPITAL_COMMUNITY)
Admission: RE | Admit: 2024-10-28 | Discharge: 2024-10-28 | Disposition: A | Discharge status: Home / Self Care | Source: Ambulatory Visit | Attending: Physician Assistant | Admitting: Physician Assistant

## 2024-10-28 ENCOUNTER — Ambulatory Visit (HOSPITAL_COMMUNITY)

## 2024-10-28 DIAGNOSIS — I7121 Aneurysm of the ascending aorta, without rupture: Secondary | ICD-10-CM | POA: Insufficient documentation

## 2024-10-28 MED ORDER — IOHEXOL 350 MG/ML SOLN
100.0000 mL | Freq: Once | INTRAVENOUS | Status: AC | PRN
  Administered 2024-10-28: 75 mL via INTRAVENOUS

## 2024-11-04 ENCOUNTER — Telehealth: Payer: Self-pay

## 2024-11-04 ENCOUNTER — Ambulatory Visit: Admitting: Urology

## 2024-11-04 DIAGNOSIS — N200 Calculus of kidney: Secondary | ICD-10-CM

## 2024-12-04 NOTE — Telephone Encounter (Signed)
 See my chart message

## 2024-12-12 ENCOUNTER — Other Ambulatory Visit (HOSPITAL_COMMUNITY): Payer: Self-pay | Admitting: Family Medicine

## 2024-12-12 DIAGNOSIS — G8929 Other chronic pain: Secondary | ICD-10-CM

## 2024-12-15 ENCOUNTER — Ambulatory Visit (HOSPITAL_COMMUNITY)
Admission: RE | Admit: 2024-12-15 | Discharge: 2024-12-15 | Disposition: A | Discharge status: Home / Self Care | Source: Ambulatory Visit | Attending: Family Medicine | Admitting: Family Medicine

## 2024-12-15 DIAGNOSIS — R109 Unspecified abdominal pain: Secondary | ICD-10-CM | POA: Diagnosis present

## 2024-12-15 DIAGNOSIS — G8929 Other chronic pain: Secondary | ICD-10-CM | POA: Insufficient documentation

## 2024-12-28 ENCOUNTER — Ambulatory Visit

## 2025-06-16 ENCOUNTER — Ambulatory Visit: Admitting: Urology
